# Patient Record
Sex: Female | Born: 1965 | Race: White | Hispanic: No | Smoking: Never smoker
Health system: Southern US, Community
[De-identification: ages and names within clinical notes are randomized; demographics above are authoritative.]

## PROBLEM LIST (undated history)

## (undated) DIAGNOSIS — M1990 Unspecified osteoarthritis, unspecified site: Secondary | ICD-10-CM

## (undated) DIAGNOSIS — G43909 Migraine, unspecified, not intractable, without status migrainosus: Secondary | ICD-10-CM

## (undated) DIAGNOSIS — I10 Essential (primary) hypertension: Secondary | ICD-10-CM

## (undated) DIAGNOSIS — Z0282 Encounter for adoption services: Secondary | ICD-10-CM

## (undated) DIAGNOSIS — K219 Gastro-esophageal reflux disease without esophagitis: Secondary | ICD-10-CM

## (undated) DIAGNOSIS — Z789 Other specified health status: Secondary | ICD-10-CM

## (undated) DIAGNOSIS — T7840XA Allergy, unspecified, initial encounter: Secondary | ICD-10-CM

## (undated) HISTORY — DX: Gastro-esophageal reflux disease without esophagitis: K21.9

## (undated) HISTORY — DX: Migraine, unspecified, not intractable, without status migrainosus: G43.909

## (undated) HISTORY — PX: CERVICAL CONE BIOPSY: SUR198

## (undated) HISTORY — DX: Allergy, unspecified, initial encounter: T78.40XA

## (undated) HISTORY — DX: Unspecified osteoarthritis, unspecified site: M19.90

## (undated) HISTORY — PX: SHOULDER SURGERY: SHX246

## (undated) HISTORY — DX: Encounter for adoption services: Z02.82

## (undated) HISTORY — DX: Other specified health status: Z78.9

---

## 1988-06-22 DIAGNOSIS — Z9889 Other specified postprocedural states: Secondary | ICD-10-CM

## 1988-06-22 HISTORY — DX: Other specified postprocedural states: Z98.890

## 2011-01-22 ENCOUNTER — Other Ambulatory Visit: Payer: Self-pay | Admitting: Orthopaedic Surgery

## 2011-01-22 DIAGNOSIS — M545 Low back pain, unspecified: Secondary | ICD-10-CM

## 2011-01-26 ENCOUNTER — Ambulatory Visit
Admission: RE | Admit: 2011-01-26 | Discharge: 2011-01-26 | Disposition: A | Discharge status: Home / Self Care | Payer: 59 | Source: Ambulatory Visit | Attending: Orthopaedic Surgery | Admitting: Orthopaedic Surgery

## 2011-01-26 DIAGNOSIS — M545 Low back pain, unspecified: Secondary | ICD-10-CM

## 2014-10-03 ENCOUNTER — Emergency Department (HOSPITAL_COMMUNITY)
Admission: EM | Admit: 2014-10-03 | Discharge: 2014-10-03 | Disposition: A | Discharge status: Home / Self Care | Payer: 59 | Attending: Emergency Medicine | Admitting: Emergency Medicine

## 2014-10-03 ENCOUNTER — Encounter (HOSPITAL_COMMUNITY): Payer: Self-pay | Admitting: *Deleted

## 2014-10-03 DIAGNOSIS — M5441 Lumbago with sciatica, right side: Secondary | ICD-10-CM

## 2014-10-03 DIAGNOSIS — I10 Essential (primary) hypertension: Secondary | ICD-10-CM | POA: Insufficient documentation

## 2014-10-03 DIAGNOSIS — Z79899 Other long term (current) drug therapy: Secondary | ICD-10-CM | POA: Insufficient documentation

## 2014-10-03 DIAGNOSIS — M5440 Lumbago with sciatica, unspecified side: Secondary | ICD-10-CM | POA: Insufficient documentation

## 2014-10-03 DIAGNOSIS — M545 Low back pain: Secondary | ICD-10-CM | POA: Diagnosis present

## 2014-10-03 DIAGNOSIS — M5442 Lumbago with sciatica, left side: Secondary | ICD-10-CM

## 2014-10-03 HISTORY — DX: Essential (primary) hypertension: I10

## 2014-10-03 MED ORDER — OXYCODONE-ACETAMINOPHEN 5-325 MG PO TABS: 1.0000 | ORAL_TABLET | ORAL | Status: DC | PRN

## 2014-10-03 MED ORDER — KETOROLAC TROMETHAMINE 30 MG/ML IJ SOLN
30.0000 mg | Freq: Once | INTRAMUSCULAR | Status: AC
  Administered 2014-10-03: 30 mg via INTRAMUSCULAR
  Filled 2014-10-03: qty 1

## 2014-10-03 MED ORDER — DIAZEPAM 5 MG PO TABS
5.0000 mg | ORAL_TABLET | Freq: Once | ORAL | Status: AC
  Administered 2014-10-03: 5 mg via ORAL
  Filled 2014-10-03: qty 1

## 2014-10-03 MED ORDER — HYDROMORPHONE HCL 1 MG/ML IJ SOLN
1.0000 mg | Freq: Once | INTRAMUSCULAR | Status: AC
Start: 2014-10-03 — End: 2014-10-03
  Administered 2014-10-03: 1 mg via INTRAMUSCULAR
  Filled 2014-10-03: qty 1

## 2014-10-03 MED ORDER — DEXAMETHASONE 4 MG PO TABS
12.0000 mg | ORAL_TABLET | Freq: Once | ORAL | Status: AC
  Administered 2014-10-03: 12 mg via ORAL
  Filled 2014-10-03: qty 3

## 2014-10-03 MED ORDER — METHOCARBAMOL 500 MG PO TABS: 500.0000 mg | ORAL_TABLET | Freq: Two times a day (BID) | ORAL | Status: DC | PRN

## 2014-10-03 NOTE — Discharge Instructions (Signed)
Back Pain, Adult °Back pain is very common. The pain often gets better over time. The cause of back pain is usually not dangerous. Most people can learn to manage their back pain on their own.  °HOME CARE  °· Stay active. Start with short walks on flat ground if you can. Try to walk farther each day. °· Do not sit, drive, or stand in one place for more than 30 minutes. Do not stay in bed. °· Do not avoid exercise or work. Activity can help your back heal faster. °· Be careful when you bend or lift an object. Bend at your knees, keep the object close to you, and do not twist. °· Sleep on a firm mattress. Lie on your side, and bend your knees. If you lie on your back, put a pillow under your knees. °· Only take medicines as told by your doctor. °· Put ice on the injured area. °¨ Put ice in a plastic bag. °¨ Place a towel between your skin and the bag. °¨ Leave the ice on for 15-20 minutes, 03-04 times a day for the first 2 to 3 days. After that, you can switch between ice and heat packs. °· Ask your doctor about back exercises or massage. °· Avoid feeling anxious or stressed. Find good ways to deal with stress, such as exercise. °GET HELP RIGHT AWAY IF:  °· Your pain does not go away with rest or medicine. °· Your pain does not go away in 1 week. °· You have new problems. °· You do not feel well. °· The pain spreads into your legs. °· You cannot control when you poop (bowel movement) or pee (urinate). °· Your arms or legs feel weak or lose feeling (numbness). °· You feel sick to your stomach (nauseous) or throw up (vomit). °· You have belly (abdominal) pain. °· You feel like you may pass out (faint). °MAKE SURE YOU:  °· Understand these instructions. °· Will watch your condition. °· Will get help right away if you are not doing well or get worse. °Document Released: 11/25/2007 Document Revised: 08/31/2011 Document Reviewed: 10/10/2013 °Exit[employer]® Patient Information ©2015 [employer]. This information is not intended  to replace advice given to you by your health care provider. Make sure you discuss any questions you have with your health care provider. ° °

## 2014-10-03 NOTE — ED Notes (Signed)
Pain low back, onset yesterday, Radiation down both thighs.

## 2014-10-03 NOTE — ED Notes (Signed)
MD at bedside. 

## 2014-10-03 NOTE — ED Provider Notes (Signed)
CSN: [id number]     Arrival date & time 10/03/14  1148 History  This chart was scribed for Raeford Razor, MD by Elveria Rising, ED scribe.  This patient was seen in room APA04/[facility]04 and the patient's care was started at 12:18 PM.    Chief Complaint  Patient presents with  . Back Pain   The history is provided by the patient. No language interpreter was used.   HPI Comments: Amanda Mack is a 49 y.o. female who presents to the Emergency Department complaining of acute lower back pain with radiation into her thighs, onset yesterday. Patient reports onset after getting up from a seated position and walking. Patient reports spasming pain in her lower, kneading pain in her buttocks, tingling pain in posterior thighs, and occasional  numbing sensation in her feet. Patient reports exacerbated pain with sitting especially and ambulation. Patient reports attempted treatment with Aleve and reports mild relief, stating it allowed her to get dressed.  Patient reports remote history of back pain, stating she hasn't had a flare up in 8 years. Patient is an Health and safety inspector and states that this activity effectively  keeps back pain at bay. Patient reports past recommendation for back surgery by Dr. Lorenso Courier, but she declined given her age.   Past Medical History  Diagnosis Date  . Hypertension    Past Surgical History  Procedure Laterality Date  . Shoulder surgery     History reviewed. No pertinent family history. History  Substance Use Topics  . Smoking status: Never Smoker   . Smokeless tobacco: Not on file  . Alcohol Use: No   OB History    No data available     Review of Systems  Constitutional: Negative for fever and chills.  HENT: Negative for congestion and sore throat.   Eyes: Negative for pain.  Respiratory: Negative for choking, shortness of breath and wheezing.   Cardiovascular: Negative for chest pain.  Gastrointestinal: Negative for vomiting, abdominal pain and diarrhea.   Genitourinary: Negative for dysuria and hematuria.  Musculoskeletal: Positive for myalgias and back pain. Negative for gait problem and neck pain.  Skin: Negative for rash.  Allergic/Immunologic: Negative for immunocompromised state.  Neurological: Negative for weakness, numbness and headaches.  Hematological: Negative for adenopathy.  Psychiatric/Behavioral: Negative for behavioral problems.      Allergies  Review of patient's allergies indicates no known allergies.  Home Medications   Prior to Admission medications   Medication Sig Start Date End Date Taking? Authorizing Provider  lisinopril-hydrochlorothiazide (PRINZIDE,ZESTORETIC) 20-12.5 MG per tablet Take 2 tablets by mouth daily. 07/20/14  Yes Historical Provider, MD  naproxen sodium (ANAPROX) 220 MG tablet Take 440 mg by mouth daily as needed (pain).   Yes Historical Provider, MD  omeprazole (PRILOSEC) 20 MG capsule Take 1 capsule by mouth at bedtime.  08/13/14  Yes Historical Provider, MD  SPRINTEC 28 0.25-35 MG-MCG tablet Take 1 tablet by mouth at bedtime.  09/10/14  Yes Historical Provider, MD   Triage Vitals: BP 123/84 mmHg  Pulse 122  Temp(Src) 97.9 F (36.6 C) (Oral)  Resp 18  Ht  (1.651 m)  Wt 210 lb (95.255 kg)  BMI 34.95 kg/m2  SpO2 100%  LMP 06/17/2014 (Exact Date) Physical Exam  Constitutional: She is oriented to person, place, and time. She appears well-developed and well-nourished. No distress.  HENT:  Head: Normocephalic and atraumatic.  Eyes: EOM are normal.  Neck: Neck supple. No tracheal deviation present.  Cardiovascular: Normal rate.   Pulmonary/Chest: Effort  normal. No respiratory distress.  Musculoskeletal: Normal range of motion.  Diffuse lumbar tenderness midline and paraspinally. Normal strength in lower extremities. Sensation intact to light touch.   Neurological: She is alert and oriented to person, place, and time.  Skin: Skin is warm and dry.  Psychiatric: She has a normal mood and  affect. Her behavior is normal.  Nursing note and vitals reviewed.   ED Course  Procedures (including critical care time)  COORDINATION OF CARE: 12:59 PM- Patient agrees to injections. Discussed treatment plan with patient at bedside and patient agreed to plan.   Labs Review Labs Reviewed - No data to display  Imaging Review No results found.   EKG Interpretation None      MDM   Final diagnoses:  Bilateral low back pain with sciatica, sciatica laterality unspecified    48yF with lower back pain.  Atraumatic. Non focal neuro exam. No blood thinning medications. No bladder/bowel incontinence or retention. Denies hx of IV drug use. Doubt cauda equina, spinal epidural abscess, spinal epidural hematoma, fracture, vertebral osteomyelitis/discitis or other potential emergent etiology. No indication for emergent imaging.  Plan symptomatic tx. Return precautions discussed. Outpt FU otherwise.  I personally preformed the services scribed in my presence. The recorded information has been reviewed is accurate. Raeford RazorStephen Chayil Gantt, MD.    Raeford RazorStephen Monish Haliburton, MD 10/08/14 1600

## 2016-01-13 ENCOUNTER — Ambulatory Visit (INDEPENDENT_AMBULATORY_CARE_PROVIDER_SITE_OTHER): Payer: 59 | Admitting: Otolaryngology

## 2016-01-13 DIAGNOSIS — R490 Dysphonia: Secondary | ICD-10-CM

## 2016-01-13 DIAGNOSIS — K219 Gastro-esophageal reflux disease without esophagitis: Secondary | ICD-10-CM

## 2016-04-06 ENCOUNTER — Encounter: Payer: Self-pay | Admitting: Family Medicine

## 2016-04-17 ENCOUNTER — Ambulatory Visit (INDEPENDENT_AMBULATORY_CARE_PROVIDER_SITE_OTHER): Payer: 59 | Admitting: Family Medicine

## 2016-04-17 ENCOUNTER — Encounter: Payer: Self-pay | Admitting: Family Medicine

## 2016-04-17 VITALS — BP 120/85 | HR 96 | Resp 12 | Ht 65.0 in | Wt 230.0 lb

## 2016-04-17 DIAGNOSIS — R002 Palpitations: Secondary | ICD-10-CM | POA: Diagnosis not present

## 2016-04-17 DIAGNOSIS — I10 Essential (primary) hypertension: Secondary | ICD-10-CM | POA: Insufficient documentation

## 2016-04-17 DIAGNOSIS — Z6841 Body Mass Index (BMI) 40.0 and over, adult: Secondary | ICD-10-CM

## 2016-04-17 DIAGNOSIS — Z6838 Body mass index (BMI) 38.0-38.9, adult: Secondary | ICD-10-CM

## 2016-04-17 DIAGNOSIS — E669 Obesity, unspecified: Secondary | ICD-10-CM | POA: Insufficient documentation

## 2016-04-17 LAB — BASIC METABOLIC PANEL
BUN: 12 mg/dL (ref 6–23)
CALCIUM: 9.6 mg/dL (ref 8.4–10.5)
CO2: 25 meq/L (ref 19–32)
Chloride: 107 mEq/L (ref 96–112)
Creatinine, Ser: 0.81 mg/dL (ref 0.40–1.20)
GFR: 80.15 mL/min (ref >60.00)
Glucose, Bld: 118 mg/dL — ABNORMAL HIGH (ref 70–99)
Potassium: 4.1 mEq/L (ref 3.5–5.1)
SODIUM: 139 meq/L (ref 135–145)

## 2016-04-17 MED ORDER — METOPROLOL SUCCINATE ER 25 MG PO TB24: 25.0000 mg | ORAL_TABLET | Freq: Every day | ORAL | 2 refills | Status: DC

## 2016-04-17 NOTE — Patient Instructions (Addendum)
A few things to remember from today's visit:   Palpitations - Plan: EKG 12-Lead  Essential hypertension - Plan: EKG 12-Lead  BMI 38.0-38.9,adult  Blood pressure goal for most people is less than 140/90.  Elevated blood pressure increases the risk of strokes, heart and kidney disease, and eye problems. Regular physical activity and a healthy diet (DASH diet) usually help. Low salt diet. Take medications as instructed. Caution with some over the counter medications as cold medications, dietary products (for weight loss), and Ibuprofen or Aleve (frequent use);all these medications could cause elevation of blood pressure.    Please be sure medication list is accurate. If a new problem present, please set up appointment sooner than planned today.

## 2016-04-17 NOTE — Progress Notes (Signed)
HPI:   Amanda Mack is a 50 y.o. female, who is here today to establish care with me.  Former PCP: Deanna Artis  Last preventive routine visit: 2017. She follows with gyn for her routine female preventive care.   Concerns today: Palpitations, "heart racing"  Over 20 years of intermittent palpitations. According to pt, at age 35 she had cardiac work-up because palpitations, negative otherwise (Holter monitor, echo). She did not have episodes for years and a few years ago started again. They had been infrequent but for the past year or so they seems to be almost daily and a few times during the day. She has not identified exacerbating or relieving factors. Palpitation usually lasts a few minutes.  She usually has symptom at rest or upon mild intensity exercise like walking from one room to another one. She denies any palpitation with moderate or intense physical activity. No associated chest pain, diaphoresis, dyspnea, or dizziness.  She denies any anxiety or Hx of depression.  Hypertension:   1-2 years Hx of HTN. Currently on Lisinopril-HCTZ 20-12.5 mg 1 tab daily, she was supposed to be on 2 tabs daily but decreased to one because she was having dizziness. Improved dizziness after decreasing dose of antihypertensive med. Reporting labs done about 6 months ago. BP readings at home < 140/90, HR mid.high 90's.  She has not noted unusual headache, visual changes, exertional chest pain, dyspnea,  focal weakness, or edema.   She exercises regularly, swimming instructor. She has not been consistent with a healthful diet but in general she does not think her diet is bad.   Hx of GERD, well controlled with Omeprazole 20 mg daily.    Review of Systems  Constitutional: Negative for activity change, appetite change, fatigue, fever and unexpected weight change.  HENT: Negative for mouth sores, nosebleeds and trouble swallowing.   Eyes: Negative for pain, redness and  visual disturbance.  Respiratory: Negative for cough, shortness of breath and wheezing.   Cardiovascular: Positive for palpitations. Negative for chest pain and leg swelling.  Gastrointestinal: Negative for abdominal pain, nausea and vomiting.       Negative for changes in bowel habits.  Genitourinary: Negative for decreased urine volume, difficulty urinating and hematuria.  Skin: Negative for color change and rash.  Neurological: Negative for syncope, weakness, numbness and headaches.  Psychiatric/Behavioral: Negative for confusion and sleep disturbance. The patient is not nervous/anxious.       Current Outpatient Prescriptions on File Prior to Visit  Medication Sig Dispense Refill  . methocarbamol (ROBAXIN) 500 MG tablet Take 1 tablet (500 mg total) by mouth 2 (two) times daily as needed for muscle spasms. 20 tablet 0  . naproxen sodium (ANAPROX) 220 MG tablet Take 440 mg by mouth daily as needed (pain).    Marland Kitchen omeprazole (PRILOSEC) 20 MG capsule Take 1 capsule by mouth at bedtime.   3  . SPRINTEC 28 0.25-35 MG-MCG tablet Take 1 tablet by mouth at bedtime.   4   No current facility-administered medications on file prior to visit.      Past Medical History:  Diagnosis Date  . Hypertension    No Known Allergies  Family History  Problem Relation Age of Onset  . Adopted: Yes    Social History   Social History  . Marital status: Married    Spouse name: N/A  . Number of children: N/A  . Years of education: N/A   Social History Main Topics  .  Smoking status: Never Smoker  . Smokeless tobacco: Never Used  . Alcohol use No  . Drug use: No  . Sexual activity: Yes    Birth control/ protection: Pill   Other Topics Concern  . None   Social History Narrative  . None    Vitals:   04/17/16 1304  BP: 120/85  Pulse: 96  Resp: 12   O2 sat at RA 98%   Body mass index is 38.27 kg/m.    Physical Exam  Nursing note and vitals reviewed. Constitutional: She is oriented  to person, place, and time. She appears well-developed. No distress.  HENT:  Head: Atraumatic.  Mouth/Throat: Oropharynx is clear and moist and mucous membranes are normal.  Eyes: Conjunctivae and EOM are normal. Pupils are equal, round, and reactive to light.  Neck: No JVD present. No tracheal deviation present. No thyroid mass and no thyromegaly present.  Cardiovascular: Normal rate and regular rhythm.   No murmur heard. Pulses:      Dorsalis pedis pulses are 2+ on the right side, and 2+ on the left side.  Respiratory: Effort normal and breath sounds normal. No respiratory distress.  GI: Soft. She exhibits no mass. There is no hepatomegaly. There is no tenderness.  Musculoskeletal: She exhibits no edema or tenderness.  Lymphadenopathy:    She has no cervical adenopathy.  Neurological: She is alert and oriented to person, place, and time. She has normal strength. No cranial nerve deficit. Coordination and gait normal.  Skin: Skin is warm. No erythema.  Psychiatric: She has a normal mood and affect.  Well groomed, good eye contact.      ASSESSMENT AND PLAN:     [name] was seen today for establish care.  Diagnoses and all orders for this visit:  Palpitations  Possible etiologies discussed, Hx does not suggest a concerning process. She is reporting recent lab work done and negative. EKG today NSR, normal axis and intervals. She agrees with trying BB and prefers to hold on cardiology referral. Instructed about warning signs.   -     EKG 12-Lead -     Basic Metabolic Panel -     metoprolol succinate (TOPROL-XL) 25 MG 24 hr tablet; Take 1 tablet (25 mg total) by mouth daily.  Essential hypertension  Adequately controlled. Because palpitations and high normal HR, I think she will benefit from BB, Metoprolol Succinate 25 mg daily.Some side effects discussed.Monitor BP at home. DASH-low salt diet recommended. Eye exam recommended annually. F/U in 2 months, before if  needed.  -     EKG 12-Lead -     Basic Metabolic Panel -     metoprolol succinate (TOPROL-XL) 25 MG 24 hr tablet; Take 1 tablet (25 mg total) by mouth daily.  BMI 38.0-38.9,adult  We discussed benefits of wt loss as well as adverse effects of obesity. Consistency with healthy diet and physical activity recommended.           Kayman Snuffer G. [place]Jordan, MD  [facility]. [place] office.

## 2016-04-17 NOTE — Progress Notes (Signed)
Pre visit review using our clinic review tool, if applicable. No additional management support is needed unless otherwise documented below in the visit note. 

## 2016-04-19 ENCOUNTER — Encounter: Payer: Self-pay | Admitting: Family Medicine

## 2016-05-21 DIAGNOSIS — G43829 Menstrual migraine, not intractable, without status migrainosus: Secondary | ICD-10-CM | POA: Insufficient documentation

## 2016-06-25 ENCOUNTER — Encounter: Payer: Self-pay | Admitting: Family Medicine

## 2016-06-25 ENCOUNTER — Ambulatory Visit (INDEPENDENT_AMBULATORY_CARE_PROVIDER_SITE_OTHER): Payer: 59 | Admitting: Family Medicine

## 2016-06-25 VITALS — BP 134/80 | HR 97 | Resp 12 | Ht 65.0 in | Wt 246.0 lb

## 2016-06-25 DIAGNOSIS — R002 Palpitations: Secondary | ICD-10-CM

## 2016-06-25 DIAGNOSIS — Z6841 Body Mass Index (BMI) 40.0 and over, adult: Secondary | ICD-10-CM

## 2016-06-25 DIAGNOSIS — R739 Hyperglycemia, unspecified: Secondary | ICD-10-CM

## 2016-06-25 DIAGNOSIS — I10 Essential (primary) hypertension: Secondary | ICD-10-CM | POA: Diagnosis not present

## 2016-06-25 LAB — POCT GLYCOSYLATED HEMOGLOBIN (HGB A1C): HEMOGLOBIN A1C: 5.3

## 2016-06-25 MED ORDER — METOPROLOL SUCCINATE ER 25 MG PO TB24: 25.0000 mg | ORAL_TABLET | Freq: Every day | ORAL | 2 refills | Status: DC

## 2016-06-25 NOTE — Progress Notes (Signed)
Pre visit review using our clinic review tool, if applicable. No additional management support is needed unless otherwise documented below in the visit note. 

## 2016-06-25 NOTE — Progress Notes (Signed)
HPI:   [name] is a 51 y.o. female, who is here today to follow on last OV, 04/17/16.  Hypertension: Started on Metoprolol Succinate 25 mg daily, she was also c/o chronic intermittent palpitations. She has tolerated medication well and noted that palpitations resolved after 2 days of taking medications. Denies severe/frequent headache, visual changes, chest pain, dyspnea, claudication, focal weakness, or edema.   BP readings at home: 116-120/70-80's.  She is concerned about her wt. She thinks her diet is healthy, she usually does not consume high amount of carbs or sweets and does not understands why she cannot lose wt.  She is planning on starting on regular exercise with a trainer. She is a Engineer, agricultural.  Last OV labs were done, not fasting. Noted glucose elevated at 118, 04/17/16. She denies Hx of diabetes.  She is reporting extensive lab work done in the past due to palpitations and obesity, including TSH.    Review of Systems  Constitutional: Positive for fatigue (no more than usual). Negative for activity change, appetite change and fever.  HENT: Negative for mouth sores, nosebleeds and trouble swallowing.   Eyes: Negative for pain, redness and visual disturbance.  Respiratory: Negative for cough, shortness of breath and wheezing.   Cardiovascular: Negative for chest pain, palpitations and leg swelling.  Gastrointestinal: Negative for abdominal pain, nausea and vomiting.       Negative for changes in bowel habits.  Endocrine: Negative for polydipsia, polyphagia and polyuria.  Genitourinary: Negative for decreased urine volume, difficulty urinating and hematuria.  Musculoskeletal: Negative for gait problem and myalgias.  Neurological: Negative for syncope, weakness and headaches.  Psychiatric/Behavioral: Negative for confusion. The patient is not nervous/anxious.       Current Outpatient Prescriptions on File Prior to Visit  Medication Sig  Dispense Refill  . omeprazole (PRILOSEC) 20 MG capsule Take 1 capsule by mouth at bedtime.   3  . SPRINTEC 28 0.25-35 MG-MCG tablet Take 1 tablet by mouth at bedtime.   4   No current facility-administered medications on file prior to visit.      Past Medical History:  Diagnosis Date  . Hypertension    No Known Allergies  Social History   Social History  . Marital status: Married    Spouse name: N/A  . Number of children: N/A  . Years of education: N/A   Social History Main Topics  . Smoking status: Never Smoker  . Smokeless tobacco: Never Used  . Alcohol use No  . Drug use: No  . Sexual activity: Yes    Birth control/ protection: Pill   Other Topics Concern  . None   Social History Narrative  . None    Vitals:   06/25/16 0932  BP: 134/80  Pulse: 97  Resp: 12   Body mass index is 40.94 kg/m.   Wt Readings from Last 3 Encounters:  06/25/16 246 lb (111.6 kg)  04/17/16 230 lb (104.3 kg)  10/03/14 210 lb (95.3 kg)      Physical Exam  Nursing note and vitals reviewed. Constitutional: She is oriented to person, place, and time. She appears well-developed. No distress.  HENT:  Head: Atraumatic.  Mouth/Throat: Oropharynx is clear and moist and mucous membranes are normal.  Eyes: Conjunctivae and EOM are normal. Pupils are equal, round, and reactive to light.  Cardiovascular: Normal rate and regular rhythm.   No murmur heard. Pulses:      Dorsalis pedis pulses are 2+ on the  right side, and 2+ on the left side.  Respiratory: Effort normal and breath sounds normal. No respiratory distress.  GI: Soft. She exhibits no mass. There is no hepatomegaly. There is no tenderness.  Musculoskeletal: She exhibits no edema.  Neurological: She is alert and oriented to person, place, and time. She has normal strength. Coordination and gait normal.  Skin: Skin is warm. No erythema.  Psychiatric: She has a normal mood and affect.  Well groomed, good eye contact.       ASSESSMENT AND PLAN:     Amanda Mack was seen today for follow-up.  Diagnoses and all orders for this visit:    Essential hypertension  Adequately controlled. No changes in current management. DASH-low salt  diet recommended. Eye exam recommended annually. F/U in 6 months, before if needed.  -     metoprolol succinate (TOPROL-XL) 25 MG 24 hr tablet; Take 1 tablet (25 mg total) by mouth daily.  Hyperglycemia  A1C in normal range. Primary prevention though regular physical activity and healthy diet discussed.  -     POC HgB A1c  Palpitations  Resolved. No changes in current management.  -     metoprolol succinate (TOPROL-XL) 25 MG 24 hr tablet; Take 1 tablet (25 mg total) by mouth daily.  BMI 40.0-44.9, adult (HCC)  Gained about 16 pounds since her last OV. We discussed benefits of wt loss as well as adverse effects of obesity. Consistency with healthy diet and physical activity recommended. Food diary may help to track calorie intake.     -[name] was advised to return sooner than planned today if new concerns arise.       Mikel Hardgrove G. [place], MD  [facility]. [place] office.

## 2016-06-25 NOTE — Patient Instructions (Signed)
A few things to remember from today's visit:   Essential hypertension - Plan: metoprolol succinate (TOPROL-XL) 25 MG 24 hr tablet  Hyperglycemia  Palpitations - Plan: metoprolol succinate (TOPROL-XL) 25 MG 24 hr tablet  BMI 40.0-44.9, adult (HCC)   Please be sure medication list is accurate. If a new problem present, please set up appointment sooner than planned today.

## 2016-08-31 ENCOUNTER — Telehealth: Payer: Self-pay | Admitting: *Deleted

## 2016-08-31 DIAGNOSIS — I10 Essential (primary) hypertension: Secondary | ICD-10-CM

## 2016-08-31 DIAGNOSIS — R002 Palpitations: Secondary | ICD-10-CM

## 2016-08-31 MED ORDER — METOPROLOL SUCCINATE ER 25 MG PO TB24: 50.0000 mg | ORAL_TABLET | Freq: Every day | ORAL | 2 refills | Status: DC

## 2016-08-31 NOTE — Telephone Encounter (Signed)
Patient called in to report blood pressure has continued to be elevated for the past 3-5 days; today bp was 180/105; patient denies chest pain, headache, dizziness, discussed with Dr. [place]Jordan who advised patient to increase Metoprolol 25mg  to two tablets daily and for patient to follow up in 4 weeks. Educated patient on medication dose change, advised of s/s to report to MD or EMS also instructed to continue to monitor blood pressures daily. Scheduled follow up with patient on 09/29/16 at 230pm.

## 2016-09-29 ENCOUNTER — Encounter: Payer: Self-pay | Admitting: Family Medicine

## 2016-09-29 ENCOUNTER — Ambulatory Visit (INDEPENDENT_AMBULATORY_CARE_PROVIDER_SITE_OTHER): Payer: 59 | Admitting: Family Medicine

## 2016-09-29 VITALS — BP 130/85 | HR 99 | Resp 12 | Ht 65.0 in | Wt 249.4 lb

## 2016-09-29 DIAGNOSIS — I10 Essential (primary) hypertension: Secondary | ICD-10-CM

## 2016-09-29 DIAGNOSIS — Z6841 Body Mass Index (BMI) 40.0 and over, adult: Secondary | ICD-10-CM | POA: Diagnosis not present

## 2016-09-29 DIAGNOSIS — R000 Tachycardia, unspecified: Secondary | ICD-10-CM | POA: Insufficient documentation

## 2016-09-29 MED ORDER — METOPROLOL SUCCINATE ER 50 MG PO TB24: 50.0000 mg | ORAL_TABLET | Freq: Every day | ORAL | 2 refills | Status: DC

## 2016-09-29 NOTE — Patient Instructions (Signed)
A few things to remember from today's visit:   Essential hypertension - Plan: metoprolol succinate (TOPROL-XL) 50 MG 24 hr tablet  Blood pressure goal for most people is less than 140/90. Some populations (older than 60) the goal is less than 150/90.  Most recent cardiologists' recommendations recommend blood pressure at or less than 130/80.   Elevated blood pressure increases the risk of strokes, heart and kidney disease, and eye problems. Regular physical activity and a healthy diet (DASH diet) usually help. Low salt diet. Take medications as instructed.  Caution with some over the counter medications as cold medications, dietary products (for weight loss), and Ibuprofen or Aleve (frequent use);all these medications could cause elevation of blood pressure.   Please be sure medication list is accurate. If a new problem present, please set up appointment sooner than planned today.

## 2016-09-29 NOTE — Progress Notes (Signed)
[name] is a 51 y.o.female, who is here today to follow on HTN.  Currently she is on Metoprolol Succinate,which was recently increased from 50 mg to 100 mg daily.She called on 08/31/16 reporting elevated BP's, 180/105. BP readings now 120/70-80. She follows a low salt diet ad exercises regularly. . She is taking medications as instructed, no side effects reported.  She has not noted unusual headache, visual changes, exertional chest pain, dyspnea,  focal weakness, or edema.  + Increased stress at work.  Lab Results  Component Value Date   CREATININE 0.81 04/17/2016   BUN 12 04/17/2016   NA 139 04/17/2016   K 4.1 04/17/2016   CL 107 04/17/2016   CO2 25 04/17/2016    Palpitations are greatly improved,she has not had an episode sine her last OV.   Review of Systems  Constitutional: Negative for appetite change, fatigue and unexpected weight change.  HENT: Negative for mouth sores, nosebleeds and trouble swallowing.   Eyes: Negative for redness and visual disturbance.  Respiratory: Negative for cough, shortness of breath and wheezing.   Cardiovascular: Negative for chest pain, palpitations and leg swelling.  Gastrointestinal: Negative for abdominal pain, nausea and vomiting.       Negative for changes in bowel habits.  Genitourinary: Negative for decreased urine volume and hematuria.  Musculoskeletal: Negative for gait problem and myalgias.  Neurological: Negative for syncope, weakness and headaches.  Psychiatric/Behavioral: Negative for confusion and sleep disturbance.     Current Outpatient Prescriptions on File Prior to Visit  Medication Sig Dispense Refill  . omeprazole (PRILOSEC) 20 MG capsule Take 1 capsule by mouth at bedtime.   3  . SPRINTEC 28 0.25-35 MG-MCG tablet Take 1 tablet by mouth at bedtime.   4   No current facility-administered medications on file prior to visit.      Past Medical History:  Diagnosis Date  . Hypertension     No  Known Allergies  Social History   Social History  . Marital status: Married    Spouse name: N/A  . Number of children: N/A  . Years of education: N/A   Social History Main Topics  . Smoking status: Never Smoker  . Smokeless tobacco: Never Used  . Alcohol use No  . Drug use: No  . Sexual activity: Yes    Birth control/ protection: Pill   Other Topics Concern  . None   Social History Narrative  . None    Vitals:   09/29/16 1429 09/29/16 1503  BP: (!) 140/100 130/85  Pulse: 99   Resp: 12    Body mass index is 41.5 kg/m.  Wt Readings from Last 3 Encounters:  09/29/16 249 lb 6 oz (113.1 kg)  06/25/16 246 lb (111.6 kg)  04/17/16 230 lb (104.3 kg)    Physical Exam  Nursing note and vitals reviewed. Constitutional: She is oriented to person, place, and time. She appears well-developed. No distress.  HENT:  Head: Atraumatic.  Mouth/Throat: Oropharynx is clear and moist and mucous membranes are normal.  Eyes: Conjunctivae and EOM are normal. Pupils are equal, round, and reactive to light.  Cardiovascular: Normal rate and regular rhythm.   No murmur heard. Pulses:      Dorsalis pedis pulses are 2+ on the right side, and 2+ on the left side.  Respiratory: Effort normal and breath sounds normal. No respiratory distress.  GI: Soft. She exhibits no mass. There is no hepatomegaly. There is no tenderness.  Musculoskeletal: She  exhibits no edema.  Lymphadenopathy:    She has no cervical adenopathy.  Neurological: She is alert and oriented to person, place, and time. She has normal strength. No cranial nerve deficit. Coordination and gait normal.  Skin: Skin is warm. No erythema.  Psychiatric: She has a normal mood and affect.  Well groomed, good eye contact.    ASSESSMENT AND PLAN:   Amanda Mack was seen today for follow-up.  Diagnoses and all orders for this visit:  Essential hypertension  Better controlled. No changes in current management. DASH-low diet  recommended. Continue monitoring BP. Eye exam recommended annually. F/U in 6 months, before if needed.  -     metoprolol succinate (TOPROL-XL) 50 MG 24 hr tablet; Take 1 tablet (50 mg total) by mouth daily. Take with or immediately following a meal.  Sinus tachycardia  She has not had symptoms recently. HR still on upper normal range. She has reported normal TSH in the past, 2016 TSH 2.5. Will check with next labs.   BMI 40.0-44.9, adult [facility])  She has gained wt steadily since 03/2016. I recommend keeping a diary of her  food intake. Continue regular exercise. We discussed benefits of wt loss as well as adverse effects of obesity.      -[name] advised to return sooner than planned today if new concerns arise.     Betty G. [place], MD  [facility]. [place] office.

## 2016-09-29 NOTE — Progress Notes (Signed)
Pre visit review using our clinic review tool, if applicable. No additional management support is needed unless otherwise documented below in the visit note. 

## 2016-12-29 ENCOUNTER — Ambulatory Visit: Payer: 59 | Admitting: Family Medicine

## 2016-12-29 DIAGNOSIS — Z0289 Encounter for other administrative examinations: Secondary | ICD-10-CM

## 2017-01-28 ENCOUNTER — Other Ambulatory Visit: Payer: Self-pay | Admitting: Occupational Medicine

## 2017-01-28 ENCOUNTER — Ambulatory Visit: Payer: Self-pay

## 2017-01-28 DIAGNOSIS — M545 Low back pain: Secondary | ICD-10-CM

## 2017-03-16 DIAGNOSIS — K219 Gastro-esophageal reflux disease without esophagitis: Secondary | ICD-10-CM | POA: Diagnosis not present

## 2017-03-16 DIAGNOSIS — R490 Dysphonia: Secondary | ICD-10-CM | POA: Diagnosis not present

## 2017-05-28 DIAGNOSIS — Z1231 Encounter for screening mammogram for malignant neoplasm of breast: Secondary | ICD-10-CM | POA: Diagnosis not present

## 2017-06-07 ENCOUNTER — Other Ambulatory Visit: Payer: Self-pay | Admitting: *Deleted

## 2017-06-07 DIAGNOSIS — I10 Essential (primary) hypertension: Secondary | ICD-10-CM

## 2017-06-07 MED ORDER — METOPROLOL SUCCINATE ER 50 MG PO TB24: 50.0000 mg | ORAL_TABLET | Freq: Every day | ORAL | 2 refills | Status: DC

## 2017-06-21 DIAGNOSIS — Z01419 Encounter for gynecological examination (general) (routine) without abnormal findings: Secondary | ICD-10-CM | POA: Diagnosis not present

## 2017-06-21 DIAGNOSIS — Z124 Encounter for screening for malignant neoplasm of cervix: Secondary | ICD-10-CM | POA: Diagnosis not present

## 2017-06-22 HISTORY — PX: KNEE ARTHROSCOPY: SUR90

## 2017-09-29 DIAGNOSIS — H1131 Conjunctival hemorrhage, right eye: Secondary | ICD-10-CM | POA: Diagnosis not present

## 2018-01-24 DIAGNOSIS — M25562 Pain in left knee: Secondary | ICD-10-CM | POA: Diagnosis not present

## 2018-02-27 NOTE — Progress Notes (Signed)
HPI:   [name] is a 52 y.o. female, who is here today for follow up.   She was last seen in 09/2016.  HTN:  She is on Metoprolol Succinate  XL 50 mg daily.  Lab Results  Component Value Date   CREATININE 0.81 04/17/2016   BUN 12 04/17/2016   NA 139 04/17/2016   K 4.1 04/17/2016   CL 107 04/17/2016   CO2 25 04/17/2016   No palpitations since Metoprolol dose was adjusted. Denies severe/frequent headache, visual changes, chest pain, dyspnea, claudication, focal weakness, or edema.   Obesity:  Dietary changes since last OV: She think she is eating healthy but frustrated because she keeps gining wt. Exercise: Engineer, agricultural.     She is is concerned about foot getting "red",bilateral. It seems to be worse after prolonged sitting and on dorsum. No pain,numbnees,or tingling. No cyanosis. This has been going on intermittently for about a year.  She wonders if this is related to varicose veins. She has right and left thigh spider veins.  She is also c/o bilateral heel pain for "a while."  It is worse in the morning when she first gets up and starts walking or after prolonged rest. No Hx of trauma. No local edema or erythema. She has not tried OTC med. Stretching exercises before walking in the morning help some. Problem seems to be getting worse.   Review of Systems  Constitutional: Positive for fatigue. Negative for activity change, appetite change and fever.  HENT: Negative for mouth sores, nosebleeds and trouble swallowing.   Eyes: Negative for redness and visual disturbance.  Respiratory: Negative for cough, shortness of breath and wheezing.   Cardiovascular: Negative for chest pain, palpitations and leg swelling.  Gastrointestinal: Negative for abdominal pain, nausea and vomiting.       Negative for changes in bowel habits.  Genitourinary: Negative for decreased urine volume and hematuria.  Musculoskeletal: Negative for gait problem and  joint swelling.  Skin: Negative for rash and wound.  Neurological: Negative for syncope, weakness and headaches.    Current Outpatient Medications on File Prior to Visit  Medication Sig Dispense Refill  . omeprazole (PRILOSEC) 20 MG capsule Take 1 capsule by mouth as needed.   3  . ranitidine (ZANTAC) 150 MG tablet Take 150 mg by mouth at bedtime.  12  . SPRINTEC 28 0.25-35 MG-MCG tablet Take 1 tablet by mouth at bedtime.   4   No current facility-administered medications on file prior to visit.      Past Medical History:  Diagnosis Date  . Hypertension    Allergies  Allergen Reactions  . Nickel Rash  . Strawberry Extract Anaphylaxis and Rash    Social History   Socioeconomic History  . Marital status: Married    Spouse name: Not on file  . Number of children: Not on file  . Years of education: Not on file  . Highest education level: Not on file  Occupational History  . Not on file  Social Needs  . Financial resource strain: Not on file  . Food insecurity:    Worry: Not on file    Inability: Not on file  . Transportation needs:    Medical: Not on file    Non-medical: Not on file  Tobacco Use  . Smoking status: Never Smoker  . Smokeless tobacco: Never Used  Substance and Sexual Activity  . Alcohol use: No  . Drug use: No  . Sexual activity:  Yes    Birth control/protection: Pill  Lifestyle  . Physical activity:    Days per week: Not on file    Minutes per session: Not on file  . Stress: Not on file  Relationships  . Social connections:    Talks on phone: Not on file    Gets together: Not on file    Attends religious service: Not on file    Active member of club or organization: Not on file    Attends meetings of clubs or organizations: Not on file    Relationship status: Not on file  Other Topics Concern  . Not on file  Social History Narrative  . Not on file    Vitals:   02/28/18 0906  BP: 124/82  Pulse: 86  Resp: 12  Temp: 99.3 F (37.4 C)    SpO2: 97%   Body mass index is 42.27 kg/m.   Wt Readings from Last 3 Encounters:  02/28/18 254 lb (115.2 kg)  09/29/16 249 lb 6 oz (113.1 kg)  06/25/16 246 lb (111.6 kg)      Physical Exam  Nursing note and vitals reviewed. Constitutional: She is oriented to person, place, and time. She appears well-developed. No distress.  HENT:  Head: Normocephalic and atraumatic.  Mouth/Throat: Oropharynx is clear and moist and mucous membranes are normal.  Eyes: Pupils are equal, round, and reactive to light. Conjunctivae are normal.  Cardiovascular: Normal rate and regular rhythm.  No murmur heard. Pulses:      Dorsalis pedis pulses are 2+ on the right side, and 2+ on the left side.  Respiratory: Effort normal and breath sounds normal. No respiratory distress.  GI: Soft. She exhibits no mass. There is no hepatomegaly. There is no tenderness.  Musculoskeletal: She exhibits no edema.  Bilateral tenderness upon palpation of heel mainly at the medial insertion of plantar fascia into calcaneous. There is no pain with palpation along planta fascia towards forefoot. small bunion  Dorsal flexion of first MTP elicits pain.  No edema or erythema appreciated on area.   Lymphadenopathy:    She has no cervical adenopathy.  Neurological: She is alert and oriented to person, place, and time. She has normal strength. No cranial nerve deficit. Gait normal.  Skin: Skin is warm. No rash noted. No erythema.  Psychiatric: She has a normal mood and affect.  Well groomed, good eye contact.       ASSESSMENT AND PLAN:   [name] was seen today for follow-up.  Orders Placed This Encounter  Procedures  . Lipid panel  . Basic metabolic panel   Lab Results  Component Value Date   CREATININE 0.80 02/28/2018   BUN 12 02/28/2018   NA 136 02/28/2018   K 4.6 02/28/2018   CL 104 02/28/2018   CO2 25 02/28/2018   Lab Results  Component Value Date   CHOL 164 02/28/2018   HDL 38.10 (L)  02/28/2018   LDLCALC 87 02/28/2018   TRIG 195.0 (H) 02/28/2018   CHOLHDL 4 02/28/2018   The 10-year ASCVD risk score Denman George DC Jr., et al., 2013) is: 1.9%   Values used to calculate the score:     Age: 65 years     Sex: Female     Is Non-Hispanic African American: No     Diabetic: No     Tobacco smoker: No     Systolic Blood Pressure: 124 mmHg     Is BP treated: Yes     HDL  Cholesterol: 38.1 mg/dL     Total Cholesterol: 164 mg/dL  Essential hypertension BP is adequately controlled. No changes in metoprolol succinate 50 mg daily. Instructed to monitor BP periodically. Continue low-salt diet. I think is appropriate to continue following annually, before if needed.  Sinus tachycardia She has been asymptomatic since metoprolol was adjusted. No changes in current management. Instructed about warning signs. Follow-up in 1 year.  Morbid obesity with BMI of 40.0-44.9, adult [facility]) She is exercising regularly. Recommend keeping a food diary. We discussed adverse effects of obesity.   Screening for lipid disorders - Lipid panel  Plantar fasciitis Educated about diagnosis. Recommend night splints, plantar stretching exercises, and inserts. If symptoms are persistent, she needs to schedule appointment with podiatrist.  Rest of foot exam normal,including pulses and capillary refill. She will continue monitoring.   Jeannemarie Sawaya G. [place], MD  [facility] [place] office.

## 2018-02-28 ENCOUNTER — Ambulatory Visit (INDEPENDENT_AMBULATORY_CARE_PROVIDER_SITE_OTHER): Payer: 59 | Admitting: Family Medicine

## 2018-02-28 ENCOUNTER — Encounter: Payer: Self-pay | Admitting: Family Medicine

## 2018-02-28 VITALS — BP 124/82 | HR 86 | Temp 99.3°F | Resp 12 | Ht 65.0 in | Wt 254.0 lb

## 2018-02-28 DIAGNOSIS — Z13220 Encounter for screening for lipoid disorders: Secondary | ICD-10-CM | POA: Diagnosis not present

## 2018-02-28 DIAGNOSIS — R000 Tachycardia, unspecified: Secondary | ICD-10-CM

## 2018-02-28 DIAGNOSIS — I10 Essential (primary) hypertension: Secondary | ICD-10-CM | POA: Diagnosis not present

## 2018-02-28 DIAGNOSIS — Z6841 Body Mass Index (BMI) 40.0 and over, adult: Secondary | ICD-10-CM

## 2018-02-28 DIAGNOSIS — M722 Plantar fascial fibromatosis: Secondary | ICD-10-CM

## 2018-02-28 LAB — BASIC METABOLIC PANEL
BUN: 12 mg/dL (ref 6–23)
CALCIUM: 9.1 mg/dL (ref 8.4–10.5)
CO2: 25 mEq/L (ref 19–32)
Chloride: 104 mEq/L (ref 96–112)
Creatinine, Ser: 0.8 mg/dL (ref 0.40–1.20)
GFR: 80.68 mL/min (ref >60.00)
GLUCOSE: 91 mg/dL (ref 70–99)
Potassium: 4.6 mEq/L (ref 3.5–5.1)
Sodium: 136 mEq/L (ref 135–145)

## 2018-02-28 LAB — LIPID PANEL
CHOL/HDL RATIO: 4
CHOLESTEROL: 164 mg/dL (ref 0–200)
HDL: 38.1 mg/dL — ABNORMAL LOW (ref >39.00)
LDL Cholesterol: 87 mg/dL (ref 0–99)
NonHDL: 125.71
TRIGLYCERIDES: 195 mg/dL — AB (ref 0.0–149.0)
VLDL: 39 mg/dL (ref 0.0–40.0)

## 2018-02-28 MED ORDER — METOPROLOL SUCCINATE ER 50 MG PO TB24: 50.0000 mg | ORAL_TABLET | Freq: Every day | ORAL | 3 refills | Status: DC

## 2018-02-28 NOTE — Assessment & Plan Note (Signed)
BP is adequately controlled. No changes in metoprolol succinate 50 mg daily. Instructed to monitor BP periodically. Continue low-salt diet. I think is appropriate to continue following annually, before if needed.

## 2018-02-28 NOTE — Assessment & Plan Note (Signed)
She has been asymptomatic since metoprolol was adjusted. No changes in current management. Instructed about warning signs. Follow-up in 1 year.

## 2018-02-28 NOTE — Assessment & Plan Note (Signed)
She is exercising regularly. Recommend keeping a food diary. We discussed adverse effects of obesity.

## 2018-02-28 NOTE — Patient Instructions (Addendum)
A few things to remember from today's visit:   Essential hypertension - Plan: Basic metabolic panel, metoprolol succinate (TOPROL-XL) 50 MG 24 hr tablet  Sinus tachycardia  Screening for lipid disorders - Plan: Lipid panel  Plantar fasciitis   [name], today we have followed on some of your chronic medical problems and they seem to be stable, so no changes in current management today.  Review medication list and be sure it is accurate.  -Remember a healthy diet and regular physical activity are very important for prevention as well as for well being; they also help with many chronic problems, decreasing the need of adding new medications and delaying or preventing possible complications.  I will see you back in 12 months.  Remember to arrange your follow up appt before leaving today.  Please follow sooner than planned if a new concern arises.  Plantar Fasciitis Plantar fasciitis is a painful foot condition that affects the heel. It occurs when the band of tissue that connects the toes to the heel bone (plantar fascia) becomes irritated. This can happen after exercising too much or doing other repetitive activities (overuse injury). The pain from plantar fasciitis can range from mild irritation to severe pain that makes it difficult for you to walk or move. The pain is usually worse in the morning or after you have been sitting or lying down for a while. What are the causes? This condition may be caused by:  Standing for long periods of time.  Wearing shoes that do not fit.  Doing high-impact activities, including running, aerobics, and ballet.  Being overweight.  Having an abnormal way of walking (gait).  Having tight calf muscles.  Having high arches in your feet.  Starting a new athletic activity.  What are the signs or symptoms? The main symptom of this condition is heel pain. Other symptoms include:  Pain that gets worse after activity or  exercise.  Pain that is worse in the morning or after resting.  Pain that goes away after you walk for a few minutes.  How is this diagnosed? This condition may be diagnosed based on your signs and symptoms. Your health care provider will also do a physical exam to check for:  A tender area on the bottom of your foot.  A high arch in your foot.  Pain when you move your foot.  Difficulty moving your foot.  You may also need to have imaging studies to confirm the diagnosis. These can include:  X-rays.  Ultrasound.  MRI.  How is this treated? Treatment for plantar fasciitis depends on the severity of the condition. Your treatment may include:  Rest, ice, and over-the-counter pain medicines to manage your pain.  Exercises to stretch your calves and your plantar fascia.  A splint that holds your foot in a stretched, upward position while you sleep (night splint).  Physical therapy to relieve symptoms and prevent problems in the future.  Cortisone injections to relieve severe pain.  Extracorporeal shock wave therapy (ESWT) to stimulate damaged plantar fascia with electrical impulses. It is often used as a last resort before surgery.  Surgery, if other treatments have not worked after 12 months.  Follow these instructions at home:  Take medicines only as directed by your health care provider.  Avoid activities that cause pain.  Roll the bottom of your foot over a bag of ice or a bottle of cold water. Do this for 20 minutes, 3-4 times a day.  Perform simple stretches  as directed by your health care provider.  Try wearing athletic shoes with air-sole or gel-sole cushions or soft shoe inserts.  Wear a night splint while sleeping, if directed by your health care provider.  Keep all follow-up appointments with your health care provider. How is this prevented?  Do not perform exercises or activities that cause heel pain.  Consider finding low-impact activities if you  continue to have problems.  Lose weight if you need to. The best way to prevent plantar fasciitis is to avoid the activities that aggravate your plantar fascia. Contact a health care provider if:  Your symptoms do not go away after treatment with home care measures.  Your pain gets worse.  Your pain affects your ability to move or do your daily activities. This information is not intended to replace advice given to you by your health care provider. Make sure you discuss any questions you have with your health care provider. Document Released: 03/03/2001 Document Revised: 11/11/2015 Document Reviewed: 04/18/2014 Elsevier Interactive Patient Education  2018 [employer].  Please be sure medication list is accurate. If a new problem present, please set up appointment sooner than planned today.

## 2018-03-03 ENCOUNTER — Encounter: Payer: Self-pay | Admitting: Family Medicine

## 2018-03-29 DIAGNOSIS — M25562 Pain in left knee: Secondary | ICD-10-CM | POA: Diagnosis not present

## 2018-04-08 DIAGNOSIS — M25562 Pain in left knee: Secondary | ICD-10-CM | POA: Diagnosis not present

## 2018-04-13 DIAGNOSIS — M1712 Unilateral primary osteoarthritis, left knee: Secondary | ICD-10-CM | POA: Diagnosis not present

## 2018-04-13 DIAGNOSIS — M25562 Pain in left knee: Secondary | ICD-10-CM | POA: Diagnosis not present

## 2018-05-05 DIAGNOSIS — M25562 Pain in left knee: Secondary | ICD-10-CM | POA: Diagnosis not present

## 2018-05-05 DIAGNOSIS — M1712 Unilateral primary osteoarthritis, left knee: Secondary | ICD-10-CM | POA: Diagnosis not present

## 2018-05-16 DIAGNOSIS — M228X2 Other disorders of patella, left knee: Secondary | ICD-10-CM | POA: Diagnosis not present

## 2018-05-16 DIAGNOSIS — G8918 Other acute postprocedural pain: Secondary | ICD-10-CM | POA: Diagnosis not present

## 2018-05-16 DIAGNOSIS — M1712 Unilateral primary osteoarthritis, left knee: Secondary | ICD-10-CM | POA: Diagnosis not present

## 2018-07-04 DIAGNOSIS — Z124 Encounter for screening for malignant neoplasm of cervix: Secondary | ICD-10-CM | POA: Diagnosis not present

## 2018-07-04 DIAGNOSIS — Z1231 Encounter for screening mammogram for malignant neoplasm of breast: Secondary | ICD-10-CM | POA: Diagnosis not present

## 2018-07-04 DIAGNOSIS — Z01419 Encounter for gynecological examination (general) (routine) without abnormal findings: Secondary | ICD-10-CM | POA: Diagnosis not present

## 2018-07-05 LAB — HM MAMMOGRAPHY

## 2018-07-08 ENCOUNTER — Encounter: Payer: Self-pay | Admitting: Family Medicine

## 2018-08-31 DIAGNOSIS — M1712 Unilateral primary osteoarthritis, left knee: Secondary | ICD-10-CM | POA: Diagnosis not present

## 2018-08-31 DIAGNOSIS — M25562 Pain in left knee: Secondary | ICD-10-CM | POA: Diagnosis not present

## 2018-09-06 ENCOUNTER — Other Ambulatory Visit: Payer: Self-pay | Admitting: Occupational Medicine

## 2018-09-06 ENCOUNTER — Ambulatory Visit: Payer: Self-pay

## 2018-09-06 ENCOUNTER — Other Ambulatory Visit: Payer: Self-pay

## 2018-09-06 DIAGNOSIS — M25572 Pain in left ankle and joints of left foot: Secondary | ICD-10-CM

## 2018-09-08 DIAGNOSIS — M1712 Unilateral primary osteoarthritis, left knee: Secondary | ICD-10-CM | POA: Diagnosis not present

## 2018-09-14 DIAGNOSIS — M1712 Unilateral primary osteoarthritis, left knee: Secondary | ICD-10-CM | POA: Diagnosis not present

## 2019-02-20 ENCOUNTER — Other Ambulatory Visit: Payer: Self-pay | Admitting: Family Medicine

## 2019-02-20 DIAGNOSIS — I10 Essential (primary) hypertension: Secondary | ICD-10-CM

## 2019-03-01 ENCOUNTER — Other Ambulatory Visit: Payer: Self-pay

## 2019-03-01 ENCOUNTER — Encounter: Payer: Self-pay | Admitting: Family Medicine

## 2019-03-01 ENCOUNTER — Ambulatory Visit (INDEPENDENT_AMBULATORY_CARE_PROVIDER_SITE_OTHER): Payer: 59 | Admitting: Family Medicine

## 2019-03-01 VITALS — BP 150/92 | HR 84 | Temp 98.4°F | Resp 12 | Ht 65.0 in | Wt 255.0 lb

## 2019-03-01 DIAGNOSIS — I10 Essential (primary) hypertension: Secondary | ICD-10-CM

## 2019-03-01 DIAGNOSIS — E559 Vitamin D deficiency, unspecified: Secondary | ICD-10-CM

## 2019-03-01 DIAGNOSIS — R600 Localized edema: Secondary | ICD-10-CM

## 2019-03-01 DIAGNOSIS — R000 Tachycardia, unspecified: Secondary | ICD-10-CM | POA: Diagnosis not present

## 2019-03-01 DIAGNOSIS — E785 Hyperlipidemia, unspecified: Secondary | ICD-10-CM | POA: Diagnosis not present

## 2019-03-01 DIAGNOSIS — Z6841 Body Mass Index (BMI) 40.0 and over, adult: Secondary | ICD-10-CM

## 2019-03-01 LAB — CBC
HCT: 37 % (ref 36.0–46.0)
Hemoglobin: 12.9 g/dL (ref 12.0–15.0)
MCHC: 34.8 g/dL (ref 30.0–36.0)
MCV: 89.7 fl (ref 78.0–100.0)
Platelets: 278 10*3/uL (ref 150.0–400.0)
RBC: 4.13 Mil/uL (ref 3.87–5.11)
RDW: 12.8 % (ref 11.5–15.5)
WBC: 6 10*3/uL (ref 4.0–10.5)

## 2019-03-01 LAB — LIPID PANEL
Cholesterol: 155 mg/dL (ref 0–200)
HDL: 37.5 mg/dL — ABNORMAL LOW (ref >39.00)
LDL Cholesterol: 87 mg/dL (ref 0–99)
NonHDL: 117.18
Total CHOL/HDL Ratio: 4
Triglycerides: 152 mg/dL — ABNORMAL HIGH (ref 0.0–149.0)
VLDL: 30.4 mg/dL (ref 0.0–40.0)

## 2019-03-01 LAB — COMPREHENSIVE METABOLIC PANEL
ALT: 15 U/L (ref 0–35)
AST: 14 U/L (ref 0–37)
Albumin: 3.8 g/dL (ref 3.5–5.2)
Alkaline Phosphatase: 49 U/L (ref 39–117)
BUN: 13 mg/dL (ref 6–23)
CO2: 23 mEq/L (ref 19–32)
Calcium: 8.9 mg/dL (ref 8.4–10.5)
Chloride: 107 mEq/L (ref 96–112)
Creatinine, Ser: 0.75 mg/dL (ref 0.40–1.20)
GFR: 81.45 mL/min (ref >60.00)
Glucose, Bld: 101 mg/dL — ABNORMAL HIGH (ref 70–99)
Potassium: 4.1 mEq/L (ref 3.5–5.1)
Sodium: 138 mEq/L (ref 135–145)
Total Bilirubin: 0.3 mg/dL (ref 0.2–1.2)
Total Protein: 6.1 g/dL (ref 6.0–8.3)

## 2019-03-01 LAB — VITAMIN D 25 HYDROXY (VIT D DEFICIENCY, FRACTURES): VITD: 11.11 ng/mL — ABNORMAL LOW (ref 30.00–100.00)

## 2019-03-01 LAB — TSH: TSH: 3.01 u[IU]/mL (ref 0.35–4.50)

## 2019-03-01 MED ORDER — METOPROLOL SUCCINATE ER 50 MG PO TB24: 50.0000 mg | ORAL_TABLET | Freq: Every day | ORAL | 3 refills | Status: DC

## 2019-03-01 NOTE — Progress Notes (Signed)
HPI:   Ms.Nikala Caryl Sieja is a 53 y.o. female, who is here today for chronic disease management. She was last seen on 02/28/2018. Since her last visit she has under gone left total knee replacement.  Hyperlipidemia, currently she is on nonpharmacologic treatment.  Lab Results  Component Value Date   CHOL 164 02/28/2018   HDL 38.10 (L) 02/28/2018   LDLCALC 87 02/28/2018   TRIG 195.0 (H) 02/28/2018   CHOLHDL 4 02/28/2018   Hypertension and sinus tachycardia, she is on metoprolol succinate 50 mg daily. She is tolerating the medication well. BP is elevated today, she reporting "good" BPs at work. 130s-140s/80s. Denies severe/frequent headache, visual changes, chest pain, dyspnea, palpitation, claudication, or focal weakness.  In regard to palpitations she "rarely" has episodes.  Lab Results  Component Value Date   CREATININE 0.80 02/28/2018   BUN 12 02/28/2018   NA 136 02/28/2018   K 4.6 02/28/2018   CL 104 02/28/2018   CO2 25 02/28/2018    She is also concerned about ankle edema usually at the end of the day. It seems to be better when she wears compression stockings. She has not noted erythema or pain. Problem has been going on for a few months.  Vit D deficiency,she is not on Vit D supplementation. 25 OH vit D in 2012 was 19.5.  Review of Systems  Constitutional: Negative for chills and fever.  HENT: Negative for mouth sores, nosebleeds and sore throat.   Respiratory: Negative for cough and wheezing.   Gastrointestinal: Negative for abdominal pain, diarrhea and vomiting.       No changes in bowel habits.  Genitourinary: Negative for decreased urine volume, dysuria and hematuria.  Musculoskeletal: Negative for gait problem and myalgias.  Skin: Negative for rash and wound.  Neurological: Negative for syncope and facial asymmetry.  Rest see pertinent positives and negatives per HPI.   Current Outpatient Medications on File Prior to Visit  Medication Sig  Dispense Refill   omeprazole (PRILOSEC) 20 MG capsule Take 1 capsule by mouth as needed.   3   SPRINTEC 28 0.25-35 MG-MCG tablet Take 1 tablet by mouth at bedtime.   4   meloxicam (MOBIC) 7.5 MG tablet meloxicam 7.5 mg tablet  TAKE 1 TABLET BY MOUTH EVERY DAY WITH MEALS     No current facility-administered medications on file prior to visit.      Past Medical History:  Diagnosis Date   Hypertension    Allergies  Allergen Reactions   Nickel Rash   Strawberry Extract Anaphylaxis and Rash    Social History   Socioeconomic History   Marital status: Married    Spouse name: Not on file   Number of children: Not on file   Years of education: Not on file   Highest education level: Not on file  Occupational History   Not on file  Social Needs   Financial resource strain: Not on file   Food insecurity    Worry: Not on file    Inability: Not on file   Transportation needs    Medical: Not on file    Non-medical: Not on file  Tobacco Use   Smoking status: Never Smoker   Smokeless tobacco: Never Used  Substance and Sexual Activity   Alcohol use: No   Drug use: No   Sexual activity: Yes    Birth control/protection: Pill  Lifestyle   Physical activity    Days per week: Not on file  Minutes per session: Not on file   Stress: Not on file  Relationships   Social connections    Talks on phone: Not on file    Gets together: Not on file    Attends religious service: Not on file    Active member of club or organization: Not on file    Attends meetings of clubs or organizations: Not on file    Relationship status: Not on file  Other Topics Concern   Not on file  Social History Narrative   Not on file    Vitals:   03/01/19 0935 03/01/19 0940  BP: (!) 160/100 (!) 150/92  Pulse: 84   Resp: 12   Temp: 98.4 F (36.9 C)   SpO2: 97%    Body mass index is 42.43 kg/m.   Physical Exam  Nursing note and vitals reviewed. Constitutional: She is  oriented to person, place, and time. She appears well-developed. No distress.  HENT:  Head: Normocephalic and atraumatic.  Mouth/Throat: Oropharynx is clear and moist and mucous membranes are normal.  Eyes: Pupils are equal, round, and reactive to light. Conjunctivae are normal.  Cardiovascular: Normal rate and regular rhythm.  No murmur heard. Pulses:      Dorsalis pedis pulses are 2+ on the right side and 2+ on the left side.  Respiratory: Effort normal and breath sounds normal. No respiratory distress.  GI: Soft. She exhibits no mass. There is no hepatomegaly. There is no abdominal tenderness.  Musculoskeletal:        General: No edema.  Lymphadenopathy:    She has no cervical adenopathy.  Neurological: She is alert and oriented to person, place, and time. She has normal strength. No cranial nerve deficit. Gait normal.  Skin: Skin is warm. No rash noted. No erythema.  Psychiatric: She has a normal mood and affect.  Well groomed, good eye contact.    ASSESSMENT AND PLAN:  Ms. Manson PasseyBrown was seen today for chronic disease management.  Diagnoses and all orders for this visit:  Lab Results  Component Value Date   CHOL 155 03/01/2019   HDL 37.50 (L) 03/01/2019   LDLCALC 87 03/01/2019   TRIG 152.0 (H) 03/01/2019   CHOLHDL 4 03/01/2019   Lab Results  Component Value Date   CREATININE 0.75 03/01/2019   BUN 13 03/01/2019   NA 138 03/01/2019   K 4.1 03/01/2019   CL 107 03/01/2019   CO2 23 03/01/2019   Lab Results  Component Value Date   TSH 3.01 03/01/2019   Lab Results  Component Value Date   WBC 6.0 03/01/2019   HGB 12.9 03/01/2019   HCT 37.0 03/01/2019   MCV 89.7 03/01/2019   PLT 278.0 03/01/2019   Lab Results  Component Value Date   ALT 15 03/01/2019   AST 14 03/01/2019   ALKPHOS 49 03/01/2019   BILITOT 0.3 03/01/2019    Essential hypertension BP elevated today but she is reporting "good" BP at home and when checked at work. Re-checked 3rd time 150/95  LUE. We discussed complications of elevated BP. No changes in current management for now,continue monitoring BP daily. Instructed about warning signs. She prefers to follow annually.  -     Comprehensive metabolic panel -     metoprolol succinate (TOPROL-XL) 50 MG 24 hr tablet; Take 1 tablet (50 mg total) by mouth daily. Take with or immediately following a meal.  Sinus tachycardia Well controlled with Metoprolol Succinate 50 mg daily.  -     TSH -  CBC  Hyperlipidemia, unspecified hyperlipidemia type Continue non pharmacologic treatment. Further recommendations according to 10 year CAD risk.  The 10-year ASCVD risk score Mikey Bussing DC Brooke Bonito., et al., 2013) is: 2.8%   Values used to calculate the score:     Age: 24 years     Sex: Female     Is Non-Hispanic African American: No     Diabetic: No     Tobacco smoker: No     Systolic Blood Pressure: 637 mmHg     Is BP treated: Yes     HDL Cholesterol: 37.5 mg/dL     Total Cholesterol: 155 mg/dL  -     Comprehensive metabolic panel -     Lipid panel  Vitamin D deficiency, unspecified Further recommendations will be given according to 25 OH vit D results.  -     Comprehensive metabolic panel -     VITAMIN D 25 Hydroxy (Vit-D Deficiency, Fractures)  Bilateral lower extremity edema No edema appreciated today. ?Vein disease. Other possible etiologies discussed. LE elevation and compression stocking recommended. Instructed about warning signs.  Morbid obesity with BMI of 40.0-44.9, adult (HCC) Wt stable. We discussed benefits of wt loss as well as adverse effects of obesity. Consistency with healthy diet and physical activity recommended.   Return in about 1 year (around 02/29/2020), or Before depending on lab results, for cpe and f/u.    -Ms. Itzia Cunliffe was advised to return sooner than planned today if new concerns arise.       Lyza Houseworth G. Martin[place], MD  [facility]. [place]  office.

## 2019-03-01 NOTE — Patient Instructions (Signed)
A few things to remember from today's visit:   Essential hypertension - Plan: Comprehensive metabolic panel  Sinus tachycardia - Plan: TSH, CBC  Hyperlipidemia, unspecified hyperlipidemia type - Plan: Comprehensive metabolic panel, Lipid panel  Vitamin D deficiency, unspecified - Plan: Comprehensive metabolic panel, VITAMIN D 25 Hydroxy (Vit-D Deficiency, Fractures)  No changes in your medications today but because your blood pressure was mildly elevated, please check blood pressure periodically and let me know the readings in about 2 weeks.  Please be sure medication list is accurate. If a new problem present, please set up appointment sooner than planned today.

## 2019-03-02 ENCOUNTER — Encounter: Payer: Self-pay | Admitting: Family Medicine

## 2019-08-08 ENCOUNTER — Encounter: Payer: Self-pay | Admitting: Family Medicine

## 2019-09-06 ENCOUNTER — Encounter: Payer: Self-pay | Admitting: Family Medicine

## 2019-09-06 ENCOUNTER — Telehealth (INDEPENDENT_AMBULATORY_CARE_PROVIDER_SITE_OTHER): Payer: 59 | Admitting: Family Medicine

## 2019-09-06 DIAGNOSIS — I10 Essential (primary) hypertension: Secondary | ICD-10-CM

## 2019-09-06 DIAGNOSIS — F419 Anxiety disorder, unspecified: Secondary | ICD-10-CM | POA: Diagnosis not present

## 2019-09-06 DIAGNOSIS — R000 Tachycardia, unspecified: Secondary | ICD-10-CM

## 2019-09-06 MED ORDER — SERTRALINE HCL 50 MG PO TABS: 25.0000 mg | ORAL_TABLET | Freq: Every day | ORAL | 1 refills | Status: DC

## 2019-09-06 NOTE — Progress Notes (Signed)
Virtual Visit via Video Note   I connected with Amanda Mack on 09/06/19 by a video enabled telemedicine application and verified that I am speaking with the correct person using two identifiers.  Location patient: home Location provider:work office Persons participating in the virtual visit: patient, provider  I discussed the limitations of evaluation and management by telemedicine and the availability of in person appointments. The patient expressed understanding and agreed to proceed.   HPI: Amanda Mack is a 54 yo female with Hx of HTN and sinus tach c/o a couple episodes of diarrhea,nausea,and occasionally vomiting since 06/10/19. Her father died in 06/10/19, so she has been travelling periodically to check on her mother, [place]. Episodes seems to be exacerbated by stress, when she travels to [place] to help her mother. Symptoms lasted about 2 days. Difficulty staying asleep and decreased appetite. COVID 19 test negative.  She has not tried OTC treatments.  She thinks symptoms may be caused by anxiety. No past Hx of anxiety. Negative for suicidal thoughts. She has also felt her heart "racing" and BP mildly elevated,140's-150's/90. A few days ago at her dentist's office BP was 150/90. HT > 100/min. She is on Metoprolol Succinate 50 mg daily.  Currently she is asymptomatic.  Negative for fever,chills,sore throat,cough,wheezing,CP,SOB,abdominal pain,artrhalgias/myalgias,urinary symptoms,or skin rash. No unusual/severe headache,visual changes,or syncope.  Since her last visit she has lost about 10 Lb.  ROS: See pertinent positives and negatives per HPI.  Past Medical History:  Diagnosis Date  . Hypertension     Past Surgical History:  Procedure Laterality Date  . SHOULDER SURGERY      Family History  Adopted: Yes   Social History   Socioeconomic History  . Marital status: Married    Spouse name: Not on file  . Number of children: Not on file  . Years of education: Not on file   . Highest education level: Not on file  Occupational History  . Not on file  Tobacco Use  . Smoking status: Never Smoker  . Smokeless tobacco: Never Used  Substance and Sexual Activity  . Alcohol use: No  . Drug use: No  . Sexual activity: Yes    Birth control/protection: Pill  Other Topics Concern  . Not on file  Social History Narrative  . Not on file   Social Determinants of Health   Financial Resource Strain:   . Difficulty of Paying Living Expenses:   Food Insecurity:   . Worried About Programme researcher, broadcasting/film/video in the Last Year:   . Barista in the Last Year:   Transportation Needs:   . Freight forwarder (Medical):   Marland Kitchen Lack of Transportation (Non-Medical):   Physical Activity:   . Days of Exercise per Week:   . Minutes of Exercise per Session:   Stress:   . Feeling of Stress :   Social Connections:   . Frequency of Communication with Friends and Family:   . Frequency of Social Gatherings with Friends and Family:   . Attends Religious Services:   . Active Member of Clubs or Organizations:   . Attends Banker Meetings:   Marland Kitchen Marital Status:   Intimate Partner Violence:   . Fear of Current or Ex-Partner:   . Emotionally Abused:   Marland Kitchen Physically Abused:   . Sexually Abused:     Current Outpatient Medications:  .  meloxicam (MOBIC) 7.5 MG tablet, meloxicam 7.5 mg tablet  TAKE 1 TABLET BY MOUTH EVERY DAY WITH MEALS, Disp: ,  Rfl:  .  metoprolol succinate (TOPROL-XL) 50 MG 24 hr tablet, Take 1 tablet (50 mg total) by mouth daily. Take with or immediately following a meal., Disp: 90 tablet, Rfl: 3 .  omeprazole (PRILOSEC) 20 MG capsule, Take 1 capsule by mouth as needed. , Disp: , Rfl: 3 .  SPRINTEC 28 0.25-35 MG-MCG tablet, Take 1 tablet by mouth at bedtime. , Disp: , Rfl: 4 .  sertraline (ZOLOFT) 50 MG tablet, Take 0.5 tablets (25 mg total) by mouth daily., Disp: 30 tablet, Rfl: 1  EXAM:  VITALS per patient if applicable:N/A  GENERAL: alert,  oriented, appears well and in no acute distress  HEENT: atraumatic, conjunctiva clear, no obvious abnormalities on inspection.  NECK: normal movements of the head and neck  LUNGS: on inspection no signs of respiratory distress, breathing rate appears normal, no obvious gross SOB, gasping or wheezing  CV: no obvious cyanosis  Amanda: moves all visible extremities without noticeable abnormality  PSYCH/NEURO: pleasant and cooperative, labile when talking about her father's death but in general no obvious depression or anxiety, speech and thought processing grossly intact  ASSESSMENT AND PLAN:  Discussed the following assessment and plan:  Sinus tachycardia Problem is being aggravated by stress/anxiety. For now no changes in Metoprolol dose. Continue monitoring HR.  Essential hypertension BP has not been well controlled. For now she does not want to increase dose of Metoprolol. Hoping than once anxiety is better controlled BP will also improve. Continue low salt diet. Monitor BP regularly. If BP still elevated we will need to consider increasing dose of Metoprolol succinate or adding a new antihypertensive agent.  Anxiety disorder, unspecified type - Plan: sertraline (ZOLOFT) 50 MG tablet GI symptoms could certainly be caused by anxiety. She agrees with trying Sertraline, starting 25 mg and increasing to 50 mg in 2-3 weeks if tolerating well. We discussed side effects,including GI symptoms and suicidal thoughts.    I discussed the assessment and treatment plan with the patient. Amanda Mack was provided an opportunity to ask questions and all were answered. The patient agreed with the plan and demonstrated an understanding of the instructions.    Return in about 6 weeks (around 10/18/2019) for Anxiety, hypertension, and tachy.    Tinaya Ceballos [place], MD

## 2019-09-08 ENCOUNTER — Telehealth: Payer: Self-pay | Admitting: Family Medicine

## 2019-09-08 NOTE — Telephone Encounter (Signed)
Scheduled pt-May 4 at 11:30 per [place]

## 2019-10-23 ENCOUNTER — Other Ambulatory Visit: Payer: Self-pay

## 2019-10-24 ENCOUNTER — Ambulatory Visit (INDEPENDENT_AMBULATORY_CARE_PROVIDER_SITE_OTHER): Payer: 59 | Admitting: Family Medicine

## 2019-10-24 ENCOUNTER — Encounter: Payer: Self-pay | Admitting: Family Medicine

## 2019-10-24 VITALS — BP 140/80 | HR 96 | Temp 97.6°F | Resp 12 | Ht 65.0 in | Wt 243.5 lb

## 2019-10-24 DIAGNOSIS — I10 Essential (primary) hypertension: Secondary | ICD-10-CM

## 2019-10-24 DIAGNOSIS — F419 Anxiety disorder, unspecified: Secondary | ICD-10-CM | POA: Diagnosis not present

## 2019-10-24 MED ORDER — SERTRALINE HCL 25 MG PO TABS: 25.0000 mg | ORAL_TABLET | Freq: Every day | ORAL | 1 refills | Status: DC

## 2019-10-24 NOTE — Assessment & Plan Note (Signed)
BP adequately controlled. Continue metoprolol succinate 50 mg daily and low-salt diet. Continue monitoring BP regularly.

## 2019-10-24 NOTE — Patient Instructions (Signed)
A few things to remember from today's visit:  No changes today. We will re-evaluate Sertraline next visit.  If you need refills please call your pharmacy. Do not use My Chart to request refills or for acute issues that need immediate attention.    Please be sure medication list is accurate. If a new problem present, please set up appointment sooner than planned today.

## 2019-10-24 NOTE — Assessment & Plan Note (Signed)
Problem has improved. She does not feel like she needs a higher dose, so continue sertraline 25 mg daily. We will reevaluate next visit and decide if we want to start weaning of medication.

## 2019-10-24 NOTE — Progress Notes (Addendum)
HPI: [name] is a 54 y.o. female, who is here today for follow up.   She was last seen on 09/06/2019, when she was having some episodes of anxiety.  This was causing episodes of diarrhea, nausea, and occasionally vomiting.   Problem was aggravated by the death of her father and relation with her mother. Sertraline 25 mg was started, she has tolerated it well. She also feels like her mother attitude has improved, she is less irritable and more agreeable with having help. She feels like anxiety has improved. She has had one episode of diarrhea, caused by anxiety when she heard her mother and sister arguing while she was on the phone. No associated nausea or vomiting,   Depression screen [facility] 2/9 10/24/2019 10/24/2019 02/28/2018  Decreased Interest 0 0 0  Down, Depressed, Hopeless 0 0 0  PHQ - 2 Score 0 0 0  Altered sleeping 1 - -  Tired, decreased energy 0 - -  Change in appetite 0 - -  Feeling bad or failure about yourself  0 - -  Trouble concentrating 0 - -  Moving slowly or fidgety/restless 0 - -  Suicidal thoughts 0 - -  PHQ-9 Score 1 - -  Difficult doing work/chores Not difficult at all - -   GAD 7 : Generalized Anxiety Score 10/24/2019  Nervous, Anxious, on Edge 0  Control/stop worrying 0  Worry too much - different things 1  Trouble relaxing 1  Restless 0  Easily annoyed or irritable 0  Afraid - awful might happen 0  Total GAD 7 Score 2  Anxiety Difficulty Not difficult at all   BP was also elevated as well as HR. Currently she is on metoprolol succinate 50 mg daily. BP readings are better during weekends but in general <140/90.  Negative for unusual headache, CP, diaphoresis, dyspnea, or edema.  Review of Systems  Constitutional: Negative for activity change, appetite change and fatigue.  HENT: Negative for mouth sores, nosebleeds and sore throat.   Eyes: Negative for redness and visual disturbance.  Respiratory: Negative for cough and wheezing.    Gastrointestinal: Negative for abdominal pain, nausea and vomiting.  Genitourinary: Negative for decreased urine volume and hematuria.  Musculoskeletal: Negative for gait problem and myalgias.  Neurological: Negative for syncope, facial asymmetry and weakness.  Psychiatric/Behavioral: Positive for sleep disturbance. Negative for confusion.  Rest of ROS, see pertinent positives sand negatives in HPI  Current Outpatient Medications on File Prior to Visit  Medication Sig Dispense Refill  . meloxicam (MOBIC) 7.5 MG tablet meloxicam 7.5 mg tablet  TAKE 1 TABLET BY MOUTH EVERY DAY WITH MEALS    . metoprolol succinate (TOPROL-XL) 50 MG 24 hr tablet Take 1 tablet (50 mg total) by mouth daily. Take with or immediately following a meal. 90 tablet 3  . omeprazole (PRILOSEC) 20 MG capsule Take 1 capsule by mouth as needed.   3  . SPRINTEC 28 0.25-35 MG-MCG tablet Take 1 tablet by mouth at bedtime.   4   No current facility-administered medications on file prior to visit.   Past Medical History:  Diagnosis Date  . Hypertension    Allergies  Allergen Reactions  . Nickel Rash  . Strawberry Extract Anaphylaxis and Rash    Social History   Socioeconomic History  . Marital status: Married    Spouse name: Not on file  . Number of children: Not on file  . Years of education: Not on file  .  Highest education level: Not on file  Occupational History  . Not on file  Tobacco Use  . Smoking status: Never Smoker  . Smokeless tobacco: Never Used  Substance and Sexual Activity  . Alcohol use: No  . Drug use: No  . Sexual activity: Yes    Birth control/protection: Pill  Other Topics Concern  . Not on file  Social History Narrative  . Not on file   Social Determinants of Health   Financial Resource Strain:   . Difficulty of Paying Living Expenses:   Food Insecurity:   . Worried About Charity fundraiser in the Last Year:   . Arboriculturist in the Last Year:   Transportation Needs:   .  Film/video editor (Medical):   Marland Kitchen Lack of Transportation (Non-Medical):   Physical Activity:   . Days of Exercise per Week:   . Minutes of Exercise per Session:   Stress:   . Feeling of Stress :   Social Connections:   . Frequency of Communication with Friends and Family:   . Frequency of Social Gatherings with Friends and Family:   . Attends Religious Services:   . Active Member of Clubs or Organizations:   . Attends Archivist Meetings:   Marland Kitchen Marital Status:     Vitals:   10/24/19 1133  BP: 140/80  Pulse: 96  Resp: 12  Temp: 97.6 F (36.4 C)  SpO2: 96%   Body mass index is 40.52 kg/m.  Physical Exam  Nursing note and vitals reviewed. Constitutional: She is oriented to person, place, and time. She appears well-developed. No distress.  HENT:  Head: Normocephalic and atraumatic.  Mouth/Throat: Oropharynx is clear and moist and mucous membranes are normal.  Eyes: Conjunctivae are normal.  Cardiovascular: Normal rate and regular rhythm.  No murmur heard. Pulses:      Dorsalis pedis pulses are 2+ on the right side and 2+ on the left side.  Respiratory: Effort normal and breath sounds normal. No respiratory distress.  GI: Soft. She exhibits no mass. There is no hepatomegaly. There is no abdominal tenderness.  Musculoskeletal:        General: No edema.  Neurological: She is alert and oriented to person, place, and time. She has normal strength. No cranial nerve deficit. Gait normal.  Skin: Skin is warm. No rash noted. No erythema.  Psychiatric: She has a normal mood and affect.  Well groomed, good eye contact.   ASSESSMENT AND PLAN:  [name] was seen today for follow-up.  No orders of the defined types were placed in this encounter.  Essential hypertension BP adequately controlled. Continue metoprolol succinate 50 mg daily and low-salt diet. Continue monitoring BP regularly.   Anxiety disorder Problem has improved. She does not feel  like she needs a higher dose, so continue sertraline 25 mg daily. We will reevaluate next visit and decide if we want to start weaning of medication.   Return in about 5 months (around 03/25/2020).   Amanda Tuohy G. [place], MD  [facility]. [place] office.

## 2020-02-28 ENCOUNTER — Other Ambulatory Visit: Payer: Self-pay | Admitting: Family Medicine

## 2020-02-28 DIAGNOSIS — I10 Essential (primary) hypertension: Secondary | ICD-10-CM

## 2020-04-17 ENCOUNTER — Other Ambulatory Visit: Payer: Self-pay | Admitting: Family Medicine

## 2020-04-22 ENCOUNTER — Other Ambulatory Visit: Payer: Self-pay

## 2020-04-22 ENCOUNTER — Ambulatory Visit (INDEPENDENT_AMBULATORY_CARE_PROVIDER_SITE_OTHER): Payer: 59 | Admitting: Family Medicine

## 2020-04-22 ENCOUNTER — Encounter: Payer: Self-pay | Admitting: Family Medicine

## 2020-04-22 VITALS — BP 138/78 | HR 73 | Temp 98.8°F | Resp 16 | Ht 65.0 in | Wt 251.4 lb

## 2020-04-22 DIAGNOSIS — Z1329 Encounter for screening for other suspected endocrine disorder: Secondary | ICD-10-CM

## 2020-04-22 DIAGNOSIS — Z0000 Encounter for general adult medical examination without abnormal findings: Secondary | ICD-10-CM | POA: Diagnosis not present

## 2020-04-22 DIAGNOSIS — Z1159 Encounter for screening for other viral diseases: Secondary | ICD-10-CM | POA: Diagnosis not present

## 2020-04-22 DIAGNOSIS — E559 Vitamin D deficiency, unspecified: Secondary | ICD-10-CM

## 2020-04-22 DIAGNOSIS — I10 Essential (primary) hypertension: Secondary | ICD-10-CM | POA: Diagnosis not present

## 2020-04-22 DIAGNOSIS — E785 Hyperlipidemia, unspecified: Secondary | ICD-10-CM

## 2020-04-22 DIAGNOSIS — Z13228 Encounter for screening for other metabolic disorders: Secondary | ICD-10-CM

## 2020-04-22 DIAGNOSIS — Z130 Encounter for screening for diseases of the blood and blood-forming organs and certain disorders involving the immune mechanism: Secondary | ICD-10-CM

## 2020-04-22 DIAGNOSIS — F419 Anxiety disorder, unspecified: Secondary | ICD-10-CM

## 2020-04-22 NOTE — Patient Instructions (Addendum)
Today you have you routine preventive visit. A few things to remember from today's visit:  Vitamin D deficiency, unspecified  Essential hypertension  Encounter for HCV screening test for low risk patient  Routine general medical examination at a health care facility  Screening for endocrine, metabolic and immunity disorder  If you need refills please call your pharmacy. Do not use My Chart to request refills or for acute issues that need immediate attention.   Please be sure medication list is accurate. If a new problem present, please set up appointment sooner than planned today.  At least 150 minutes of moderate exercise per week, daily brisk walking for 15-30 min is a good exercise option. Healthy diet low in saturated (animal) fats and sweets and consisting of fresh fruits and vegetables, lean meats such as fish and white chicken and whole grains.  These are some of recommendations for screening depending of age and risk factors:  - Vaccines:  Tdap vaccine every 10 years.  Shingles vaccine recommended at age 75, could be given after 52 years of age but not sure about insurance coverage.   Pneumonia vaccines: Pneumovax at 65. Sometimes Pneumovax is giving earlier if history of smoking, lung disease,diabetes,kidney disease among some.  Screening for diabetes at age 89 and every 3 years.  Cervical cancer prevention:  Pap smear starts at 52 years of age and continues periodically until 52 years old in low risk women. Pap smear every 3 years between 42 and 64 years old. Pap smear every 3-5 years between women 30 and older if pap smear negative and HPV screening negative.   -Breast cancer: Mammogram: There is disagreement between experts about when to start screening in low risk asymptomatic female but recent recommendations are to start screening at 39 and not later than 52 years old , every 1-2 years and after 52 yo q 2 years. Screening is recommended until 51 years old but  some women can continue screening depending of healthy issues.  Colon cancer screening: Has been recently changed to 52 yo. Insurance may not cover until you are 52 years old. Screening is recommended until 52 years old.  Cholesterol disorder screening at age 31 and every 3 years.  Also recommended:  1. Dental visit- Brush and floss your teeth twice daily; visit your dentist twice a year. 2. Eye doctor- Get an eye exam at least every 2 years. 3. Helmet use- Always wear a helmet when riding a bicycle, motorcycle, rollerblading or skateboarding. 4. Safe sex- If you may be exposed to sexually transmitted infections, use a condom. 5. Seat belts- Seat belts can save your live; always wear one. 6. Smoke/Carbon Monoxide detectors- These detectors need to be installed on the appropriate level of your home. Replace batteries at least once a year. 7. Skin cancer- When out in the sun please cover up and use sunscreen 15 SPF or higher. 8. Violence- If anyone is threatening or hurting you, please tell your healthcare provider.  9. Drink alcohol in moderation- Limit alcohol intake to one drink or less per day. Never drink and drive. 10. Calcium supplementation 1000 to 1200 mg daily, ideally through your diet.  Vitamin D supplementation 800 units daily.

## 2020-04-22 NOTE — Progress Notes (Signed)
HPI: [name] is a pleasant  54 y.o. female, who is here today for her routine physical.  Last CPE: > a year ago.  Regular exercise 3 or more time per week: Walks every other day and weekends, planning on starting aquatic zoomba. Following a healthy diet: yes. She lives with her husband.  Chronic medical problems: HTN,sinus tach,HLD,and vit D deficiency among some.  Pap smear: 07/04/18.  Immunization History  Administered Date(s) Administered  . PFIZER SARS-COV-2 Vaccination 04/19/2020  . Td 01/01/2015   Mammogram: 12/06/19 Colonoscopy: Within the past 10 years. DEXA: N/A  Hep C screening: Never.  She has no new concerns today.  Anxiety: Exacerbated by the death of her father. She has been on Sertraline 25 mg since 08/2019. She has tolerated well. She thinks medication ca now be discontinued. She is feeling better, her mother is doing well otherwise.  HTN and sinus tack: She is on Metoprolol Succinate 50 mg daily. Negative for CP,SOB,or focal weakness.  Component     Latest Ref Rng & Units 03/01/2019  Sodium     135 - 146 mmol/L 138  Potassium     3.5 - 5.3 mmol/L 4.1  Chloride     98 - 110 mmol/L 107  CO2     20 - 32 mmol/L 23  Glucose     65 - 99 mg/dL 093 (H)  BUN     7 - 25 mg/dL 13  Creatinine     2.67 - 1.05 mg/dL 1.24   HLD: She is on non pharmacologic treatment.  Component     Latest Ref Rng & Units 03/01/2019  Cholesterol     <200 mg/dL 580  Triglycerides     <150 mg/dL 998.3 (H)  HDL Cholesterol     > OR = 50 mg/dL 38.25 (L)  VLDL     0.0 - 40.0 mg/dL 05.3  LDL (calc)     0 - 99 mg/dL 87  Total CHOL/HDL Ratio     <5.0 (calc) 4  NonHDL      117.18   Vit D deficiency: Not consistent with vit D supplementation.  Review of Systems  Constitutional: Negative for appetite change, fatigue and fever.  HENT: Negative for dental problem, hearing loss, mouth sores and sore throat.   Eyes: Negative for redness and visual disturbance.   Respiratory: Negative for cough and wheezing.   Cardiovascular: Negative for palpitations and leg swelling.  Gastrointestinal: Negative for abdominal pain, nausea and vomiting.       No changes in bowel habits.  Endocrine: Negative for cold intolerance, heat intolerance, polydipsia, polyphagia and polyuria.  Genitourinary: Negative for decreased urine volume, dysuria, hematuria, vaginal bleeding and vaginal discharge.  Musculoskeletal: Positive for arthralgias. Negative for gait problem and myalgias.  Skin: Negative for color change and rash.  Allergic/Immunologic: Negative for environmental allergies.  Neurological: Negative for syncope, weakness and headaches.  Hematological: Negative for adenopathy. Does not bruise/bleed easily.  Psychiatric/Behavioral: Negative for confusion. The patient is not nervous/anxious.   All other systems reviewed and are negative.  Current Outpatient Medications on File Prior to Visit  Medication Sig Dispense Refill  . meloxicam (MOBIC) 7.5 MG tablet meloxicam 7.5 mg tablet  TAKE 1 TABLET BY MOUTH EVERY DAY WITH MEALS    . metoprolol succinate (TOPROL-XL) 50 MG 24 hr tablet TAKE 1 TABLET (50 MG TOTAL) BY MOUTH DAILY. TAKE WITH OR IMMEDIATELY FOLLOWING A MEAL. 90 tablet 3  . omeprazole (PRILOSEC) 20 MG capsule  Take 1 capsule by mouth as needed.   3  . sertraline (ZOLOFT) 25 MG tablet TAKE 1 TABLET BY MOUTH EVERY DAY 90 tablet 1  . SPRINTEC 28 0.25-35 MG-MCG tablet Take 1 tablet by mouth at bedtime.   4   No current facility-administered medications on file prior to visit.   Past Medical History:  Diagnosis Date  . Hypertension     Past Surgical History:  Procedure Laterality Date  . SHOULDER SURGERY      Allergies  Allergen Reactions  . Nickel Rash  . Strawberry Extract Anaphylaxis and Rash    Family History  Adopted: Yes    Social History   Socioeconomic History  . Marital status: Married    Spouse name: Not on file  . Number of  children: Not on file  . Years of education: Not on file  . Highest education level: Not on file  Occupational History  . Not on file  Tobacco Use  . Smoking status: Never Smoker  . Smokeless tobacco: Never Used  Substance and Sexual Activity  . Alcohol use: No  . Drug use: No  . Sexual activity: Yes    Birth control/protection: Pill  Other Topics Concern  . Not on file  Social History Narrative  . Not on file   Social Determinants of Health   Financial Resource Strain:   . Difficulty of Paying Living Expenses: Not on file  Food Insecurity:   . Worried About Programme researcher, broadcasting/film/video in the Last Year: Not on file  . Ran Out of Food in the Last Year: Not on file  Transportation Needs:   . Lack of Transportation (Medical): Not on file  . Lack of Transportation (Non-Medical): Not on file  Physical Activity:   . Days of Exercise per Week: Not on file  . Minutes of Exercise per Session: Not on file  Stress:   . Feeling of Stress : Not on file  Social Connections:   . Frequency of Communication with Friends and Family: Not on file  . Frequency of Social Gatherings with Friends and Family: Not on file  . Attends Religious Services: Not on file  . Active Member of Clubs or Organizations: Not on file  . Attends Banker Meetings: Not on file  . Marital Status: Not on file   Vitals:   04/22/20 0955  BP: 138/78  Pulse: 73  Resp: 16  Temp: 98.8 F (37.1 C)  SpO2: 98%   Body mass index is 41.83 kg/m.  Wt Readings from Last 3 Encounters:  04/22/20 251 lb 6 oz (114 kg)  10/24/19 243 lb 8 oz (110.5 kg)  03/01/19 255 lb (115.7 kg)   Physical Exam Vitals and nursing note reviewed.  Constitutional:      General: She is not in acute distress.    Appearance: She is well-developed.  HENT:     Head: Normocephalic and atraumatic.     Right Ear: Hearing, tympanic membrane, ear canal and external ear normal.     Left Ear: Hearing, tympanic membrane, ear canal and  external ear normal.     Mouth/Throat:     Mouth: Mucous membranes are moist.     Pharynx: Oropharynx is clear. Uvula midline.  Eyes:     Extraocular Movements: Extraocular movements intact.     Conjunctiva/sclera: Conjunctivae normal.     Pupils: Pupils are equal, round, and reactive to light.  Neck:     Thyroid: No thyromegaly.  Trachea: No tracheal deviation.  Cardiovascular:     Rate and Rhythm: Normal rate and regular rhythm.     Pulses:          Dorsalis pedis pulses are 2+ on the right side and 2+ on the left side.     Heart sounds: No murmur heard.   Pulmonary:     Effort: Pulmonary effort is normal. No respiratory distress.     Breath sounds: Normal breath sounds.  Abdominal:     Palpations: Abdomen is soft. There is no hepatomegaly or mass.     Tenderness: There is no abdominal tenderness.  Genitourinary:    Comments: Deferred to gyn. Musculoskeletal:     Comments: No major deformity or signs of synovitis appreciated.  Lymphadenopathy:     Cervical: No cervical adenopathy.     Upper Body:     Right upper body: No supraclavicular adenopathy.     Left upper body: No supraclavicular adenopathy.  Skin:    General: Skin is warm.     Findings: No erythema or rash.  Neurological:     General: No focal deficit present.     Mental Status: She is alert and oriented to person, place, and time.     Cranial Nerves: No cranial nerve deficit.     Coordination: Coordination normal.     Gait: Gait normal.     Deep Tendon Reflexes:     Reflex Scores:      Bicep reflexes are 2+ on the right side and 2+ on the left side.      Patellar reflexes are 2+ on the right side and 2+ on the left side. Psychiatric:        Mood and Affect: Mood and affect normal.     Comments: Well groomed, good eye contact.   ASSESSMENT AND PLAN:  [name] was here today annual physical examination.  Orders Placed This Encounter  Procedures  . COMPLETE METABOLIC PANEL WITH GFR  .  Lipid panel  . Hepatitis C antibody  . VITAMIN D 25 Hydroxy (Vit-D Deficiency, Fractures)   Lab Results  Component Value Date   CHOL 160 04/22/2020   HDL 37 (L) 04/22/2020   LDLCALC 91 04/22/2020   TRIG 232 (H) 04/22/2020   CHOLHDL 4.3 04/22/2020   Lab Results  Component Value Date   CREATININE 0.74 04/22/2020   BUN 14 04/22/2020   NA 138 04/22/2020   K 4.3 04/22/2020   CL 107 04/22/2020   CO2 24 04/22/2020   Lab Results  Component Value Date   ALT 19 04/22/2020   AST 21 04/22/2020   ALKPHOS 49 03/01/2019   BILITOT 0.3 04/22/2020    Routine general medical examination at a health care facility Encouraged to continue regular physical activity and healthful diet for prevention of chronic illness and/or complications. Preventive guidelines reviewed. Vaccination up to date. We need to obtain copy of last colonoscopy. Continue female preventive  Ca++ and vit D supplementation recommended. Next CPE in a year.  The 10-year ASCVD risk score Denman George DC Montez Hageman., et al., 2013) is: 2.8%   Values used to calculate the score:     Age: 27 years     Sex: Female     Is Non-Hispanic African American: No     Diabetic: No     Tobacco smoker: No     Systolic Blood Pressure: 138 mmHg     Is BP treated: Yes     HDL Cholesterol: 37 mg/dL  Total Cholesterol: 160 mg/dL  Vitamin D deficiency, unspecified Further recommendations according to 25 [place] vit D results.  Essential hypertension SBP closed to 140, I would like =< 130. For now continue Metoprolol Succinate 50 mg daily. Monitor BP regularly. Continue low salt diet.  Encounter for HCV screening test for low risk patient -     Hepatitis C antibody  Screening for endocrine, metabolic and immunity disorder -     COMPLETE METABOLIC PANEL WITH GFR  Hyperlipidemia, unspecified hyperlipidemia type Continue non pharmacologic treatment. Further recommendations according to 10 years CVD risk and lipid panel numbers.  Anxiety disorder,  unspecified type I think it is appropriate to start weaning off Sertraline as tolerated. If symptoms reoccured she can go back to prior dose and continue.   Return in 1 year (on 04/22/2021) for CPE.  Gwenivere Hiraldo G. [place], MD  [facility]. [place] [facility].  Today you have you routine preventive visit. A few things to remember from today's visit:  Vitamin D deficiency, unspecified  Essential hypertension  Encounter for HCV screening test for low risk patient  Routine general medical examination at a health care facility  Screening for endocrine, metabolic and immunity disorder  If you need refills please call your pharmacy. Do not use My Chart to request refills or for acute issues that need immediate attention.   Please be sure medication list is accurate. If a new problem present, please set up appointment sooner than planned today.  At least 150 minutes of moderate exercise per week, daily brisk walking for 15-30 min is a good exercise option. Healthy diet low in saturated (animal) fats and sweets and consisting of fresh fruits and vegetables, lean meats such as fish and white chicken and whole grains.  These are some of recommendations for screening depending of age and risk factors:  - Vaccines:  Tdap vaccine every 10 years.  Shingles vaccine recommended at age 560, could be given after 52 years of age but not sure about insurance coverage.   Pneumonia vaccines: Pneumovax at 65. Sometimes Pneumovax is giving earlier if history of smoking, lung disease,diabetes,kidney disease among some.  Screening for diabetes at age 52 and every 3 years.  Cervical cancer prevention:  Pap smear starts at 52 years of age and continues periodically until 52 years old in low risk women. Pap smear every 3 years between [address] and 52 years old. Pap smear every 3-5 years between women 30 and older if pap smear negative and HPV screening negative.   -Breast cancer: Mammogram: There is  disagreement between experts about when to start screening in low risk asymptomatic female but recent recommendations are to start screening at 5840 and not later than 52 years old , every 1-2 years and after 52 yo q 2 years. Screening is recommended until 52 years old but some women can continue screening depending of healthy issues.  Colon cancer screening: Has been recently changed to 52 yo. Insurance may not cover until you are 52 years old. Screening is recommended until 52 years old.  Cholesterol disorder screening at age 52 and every 3 years.  Also recommended:  1. Dental visit- Brush and floss your teeth twice daily; visit your dentist twice a year. 2. Eye doctor- Get an eye exam at least every 2 years. 3. Helmet use- Always wear a helmet when riding a bicycle, motorcycle, rollerblading or skateboarding. 4. Safe sex- If you may be exposed to sexually transmitted infections, use a condom. 5. Seat belts- Seat belts  can save your live; always wear one. 6. Smoke/Carbon Monoxide detectors- These detectors need to be installed on the appropriate level of your home. Replace batteries at least once a year. 7. Skin cancer- When out in the sun please cover up and use sunscreen 15 SPF or higher. 8. Violence- If anyone is threatening or hurting you, please tell your healthcare provider.  9. Drink alcohol in moderation- Limit alcohol intake to one drink or less per day. Never drink and drive. 10. Calcium supplementation 1000 to 1200 mg daily, ideally through your diet.  Vitamin D supplementation 800 units daily.

## 2020-04-23 LAB — LIPID PANEL
Cholesterol: 160 mg/dL (ref <200)
HDL: 37 mg/dL — ABNORMAL LOW (ref >=50)
LDL Cholesterol (Calc): 91 mg/dL (calc)
Non-HDL Cholesterol (Calc): 123 mg/dL (calc) (ref <130)
Total CHOL/HDL Ratio: 4.3 (calc) (ref <5.0)
Triglycerides: 232 mg/dL — ABNORMAL HIGH (ref <150)

## 2020-04-23 LAB — COMPLETE METABOLIC PANEL WITH GFR
AG Ratio: 1.5 (calc) (ref 1.0–2.5)
ALT: 19 U/L (ref 6–29)
AST: 21 U/L (ref 10–35)
Albumin: 3.8 g/dL (ref 3.6–5.1)
Alkaline phosphatase (APISO): 52 U/L (ref 37–153)
BUN: 14 mg/dL (ref 7–25)
CO2: 24 mmol/L (ref 20–32)
Calcium: 8.9 mg/dL (ref 8.6–10.4)
Chloride: 107 mmol/L (ref 98–110)
Creat: 0.74 mg/dL (ref 0.50–1.05)
GFR, Est African American: 108 mL/min/{1.73_m2} (ref >=60)
GFR, Est Non African American: 93 mL/min/{1.73_m2} (ref >=60)
Globulin: 2.5 g/dL (calc) (ref 1.9–3.7)
Glucose, Bld: 96 mg/dL (ref 65–99)
Potassium: 4.3 mmol/L (ref 3.5–5.3)
Sodium: 138 mmol/L (ref 135–146)
Total Bilirubin: 0.3 mg/dL (ref 0.2–1.2)
Total Protein: 6.3 g/dL (ref 6.1–8.1)

## 2020-04-23 LAB — VITAMIN D 25 HYDROXY (VIT D DEFICIENCY, FRACTURES): Vit D, 25-Hydroxy: 18 ng/mL — ABNORMAL LOW (ref 30–100)

## 2020-04-23 LAB — HEPATITIS C ANTIBODY
Hepatitis C Ab: NONREACTIVE
SIGNAL TO CUT-OFF: 0.01 (ref <1.00)

## 2020-08-28 ENCOUNTER — Telehealth: Payer: Self-pay | Admitting: Family Medicine

## 2020-08-28 ENCOUNTER — Other Ambulatory Visit (HOSPITAL_BASED_OUTPATIENT_CLINIC_OR_DEPARTMENT_OTHER): Payer: Self-pay

## 2020-08-28 MED ORDER — SERTRALINE HCL 25 MG PO TABS
25.0000 mg | ORAL_TABLET | Freq: Every day | ORAL | 1 refills | Status: DC
  Filled 2020-08-28: qty 90, 90d supply, fill #0

## 2020-08-28 MED ORDER — NORGESTIMATE-ETH ESTRADIOL 0.25-35 MG-MCG PO TABS
1.0000 | ORAL_TABLET | Freq: Every day | ORAL | 4 refills | Status: DC
  Filled 2020-08-28: qty 112, 84d supply, fill #0

## 2020-08-28 MED FILL — Metoprolol Succinate Tab ER 24HR 50 MG (Tartrate Equiv): ORAL | 90 days supply | Qty: 90 | Fill #0 | Status: AC

## 2020-08-28 NOTE — Telephone Encounter (Signed)
Patient needs a refill called in for Certriline 25 mg tablets taken 1 time daily  Pharmacy- [facility]

## 2020-08-28 NOTE — Telephone Encounter (Signed)
Rx sent in

## 2020-08-29 ENCOUNTER — Other Ambulatory Visit (HOSPITAL_BASED_OUTPATIENT_CLINIC_OR_DEPARTMENT_OTHER): Payer: Self-pay

## 2020-09-02 ENCOUNTER — Other Ambulatory Visit (HOSPITAL_BASED_OUTPATIENT_CLINIC_OR_DEPARTMENT_OTHER): Payer: Self-pay

## 2020-09-06 ENCOUNTER — Other Ambulatory Visit (HOSPITAL_BASED_OUTPATIENT_CLINIC_OR_DEPARTMENT_OTHER): Payer: Self-pay

## 2020-09-16 ENCOUNTER — Other Ambulatory Visit (HOSPITAL_BASED_OUTPATIENT_CLINIC_OR_DEPARTMENT_OTHER): Payer: Self-pay

## 2020-09-23 ENCOUNTER — Other Ambulatory Visit (HOSPITAL_COMMUNITY)
Admission: RE | Admit: 2020-09-23 | Discharge: 2020-09-23 | Disposition: A | Discharge status: Home / Self Care | Payer: No Typology Code available for payment source | Source: Ambulatory Visit | Attending: Obstetrics & Gynecology | Admitting: Obstetrics & Gynecology

## 2020-09-23 ENCOUNTER — Ambulatory Visit (INDEPENDENT_AMBULATORY_CARE_PROVIDER_SITE_OTHER): Payer: No Typology Code available for payment source | Admitting: Obstetrics & Gynecology

## 2020-09-23 ENCOUNTER — Other Ambulatory Visit (HOSPITAL_BASED_OUTPATIENT_CLINIC_OR_DEPARTMENT_OTHER): Payer: Self-pay

## 2020-09-23 ENCOUNTER — Other Ambulatory Visit: Payer: Self-pay

## 2020-09-23 ENCOUNTER — Encounter (HOSPITAL_BASED_OUTPATIENT_CLINIC_OR_DEPARTMENT_OTHER): Payer: Self-pay | Admitting: Obstetrics & Gynecology

## 2020-09-23 VITALS — BP 158/98 | HR 87 | Ht 64.5 in | Wt 232.0 lb

## 2020-09-23 DIAGNOSIS — N912 Amenorrhea, unspecified: Secondary | ICD-10-CM | POA: Diagnosis not present

## 2020-09-23 DIAGNOSIS — Z01419 Encounter for gynecological examination (general) (routine) without abnormal findings: Secondary | ICD-10-CM

## 2020-09-23 DIAGNOSIS — Z8742 Personal history of other diseases of the female genital tract: Secondary | ICD-10-CM

## 2020-09-23 DIAGNOSIS — I10 Essential (primary) hypertension: Secondary | ICD-10-CM | POA: Diagnosis not present

## 2020-09-23 DIAGNOSIS — Z124 Encounter for screening for malignant neoplasm of cervix: Secondary | ICD-10-CM | POA: Insufficient documentation

## 2020-09-23 DIAGNOSIS — E782 Mixed hyperlipidemia: Secondary | ICD-10-CM | POA: Insufficient documentation

## 2020-09-23 DIAGNOSIS — E785 Hyperlipidemia, unspecified: Secondary | ICD-10-CM | POA: Insufficient documentation

## 2020-09-23 DIAGNOSIS — E781 Pure hyperglyceridemia: Secondary | ICD-10-CM | POA: Diagnosis not present

## 2020-09-23 DIAGNOSIS — G43829 Menstrual migraine, not intractable, without status migrainosus: Secondary | ICD-10-CM

## 2020-09-23 DIAGNOSIS — Z658 Other specified problems related to psychosocial circumstances: Secondary | ICD-10-CM | POA: Diagnosis not present

## 2020-09-23 MED ORDER — SUMATRIPTAN SUCCINATE 100 MG PO TABS
100.0000 mg | ORAL_TABLET | Freq: Once | ORAL | 11 refills | Status: AC | PRN
  Filled 2020-09-23: qty 9, 30d supply, fill #0

## 2020-09-23 MED ORDER — NORETHINDRONE 0.35 MG PO TABS
1.0000 | ORAL_TABLET | Freq: Every day | ORAL | 11 refills | Status: DC
  Filled 2020-09-23: qty 28, 28d supply, fill #0

## 2020-09-23 NOTE — Progress Notes (Signed)
55 y.o. G0P0000 Widowed White or Caucasian female here for new patient exam.  Has been on ortho Cyclen for years.  Takes continuous active for control of menstrual migraines.    Father passed 13 months before husband which was about two months ago.  She and husband were married for almost 20 years.  He had four children that were 2 - 16 when they were initially together.  She is now in process of adopting the youngest who is in his early 76's.  Very stressful time for her.  H/o "hormonal issues" including menstrual migraines.  Was put on continuous OCPs.  Uses migraine excedrin if has a migraine.  Hasn't needed more.  Remote hx of abnormal pap smears.  Had LEEP and "acid treatment".  Has been normal for several years.    No LMP recorded. (Menstrual status: Oral contraceptives).          Sexually active: No.  The current method of family planning is OCP (estrogen/progesterone).   Exercising: Yes.    water fit Smoker:  no  Health Maintenance: Pap:  2020 normal History of abnormal Pap:  yes MMG:  08/08/2019 Colonoscopy:  Release will be signed BMD:   n/a TDaP:  712/2016 Pneumonia vaccine(s):  n/a Shingrix:   Discussed  Hep C testing: 04/22/2020 Screening Labs: 04/22/2020   reports that she has never smoked. She has never used smokeless tobacco. She reports that she does not drink alcohol and does not use drugs.  Past Medical History:  Diagnosis Date  . Hypertension     Past Surgical History:  Procedure Laterality Date  . SHOULDER SURGERY      Current Outpatient Medications  Medication Sig Dispense Refill  . metoprolol succinate (TOPROL-XL) 50 MG 24 hr tablet TAKE 1 TABLET (50 MG TOTAL) BY MOUTH DAILY. TAKE WITH OR IMMEDIATELY FOLLOWING A MEAL. 90 tablet 3  . norgestimate-ethinyl estradiol (ORTHO-CYCLEN) 0.25-35 MG-MCG tablet Take 1 tablet by mouth daily skipping placebos. 112 tablet 4  . omeprazole (PRILOSEC) 20 MG capsule Take 1 capsule by mouth as needed.   3  . sertraline  (ZOLOFT) 25 MG tablet Take 1 tablet (25 mg total) by mouth daily. 90 tablet 1   No current facility-administered medications for this visit.    Family History  Adopted: Yes    Review of Systems  All other systems reviewed and are negative.   Exam:   BP (!) 170/102   Pulse 87   Ht 5' 4.5" (1.638 m)   Wt 232 lb (105.2 kg)   BMI 39.21 kg/m   Height: 5' 4.5" (163.8 cm)  Recheck BP after exam complete:  158/98  General appearance: alert, cooperative and appears stated age Head: Normocephalic, without obvious abnormality, atraumatic Neck: no adenopathy, supple, symmetrical, trachea midline and thyroid normal to inspection and palpation Lungs: clear to auscultation bilaterally Breasts: normal appearance, no masses or tenderness Heart: regular rate and rhythm Abdomen: soft, non-tender; bowel sounds normal; no masses,  no organomegaly Extremities: extremities normal, atraumatic, no cyanosis or edema Skin: Skin color, texture, turgor normal. No rashes or lesions Lymph nodes: Cervical, supraclavicular, and axillary nodes normal. No abnormal inguinal nodes palpated Neurologic: Grossly normal   Pelvic: External genitalia:  no lesions              Urethra:  normal appearing urethra with no masses, tenderness or lesions              Bartholins and Skenes: normal  Vagina: normal appearing vagina with normal color and discharge, no lesions              Cervix: no lesions              Pap taken: Yes.   Bimanual Exam:  Uterus:  normal size, contour, position, consistency, mobility, non-tender              Adnexa: normal adnexa and no mass, fullness, tenderness               Rectovaginal: Confirms               Anus:  normal sphincter tone, no lesions  Chaperone, Francene Finders, RN, was present for exam.  Assessment/Plan: 1. Well woman exam with routine gynecological exam - pap and HR HPV obtained today - MMG needs to be scheduled.  Order placed. - Colonoscopy release  signed to hopefully get copy - lab work done with Dr. [place] 04/2020 - vaccines updated.  Pt needs to get me dates of her Covid vaccinations.  She did complete it with the booster.  2. Amenorrhea - Anti mullerian hormone; Future  3. H/O abnormal cervical Papanicolaou smear  4. High blood triglycerides  5. Essential hypertension - pt advised to stop combination OCPs today due to elevated blood pressure and risks of stroke - recommended starting 25mg  HCTZ and plan BP recheck with me or with Dr. .  Pt declines starting BP medication today.  Wants to see if stopping OCPs helps.  Feel strongly she is going to need hypertension management.  Will have pt check BP daily this week.  If stays elevated, she will need treatment.  Concerns about stroke discussed with pt.  She voices understanding.  6. Psychosocial stressors - on low dosed Zoloft.  Does not need feel needs any dosage adjustment.  7.  Menstrual migraines - will transition to progesterone only pill.  Rx to pharmacy.  Pt will start today.  If in menopause (checking AMH) may be able to stop all hormonal therapy.  - also rx for imitrex given in case has migraine with combination OCP cessation.

## 2020-09-24 ENCOUNTER — Encounter (HOSPITAL_BASED_OUTPATIENT_CLINIC_OR_DEPARTMENT_OTHER): Payer: Self-pay | Admitting: Obstetrics & Gynecology

## 2020-09-24 ENCOUNTER — Other Ambulatory Visit (HOSPITAL_BASED_OUTPATIENT_CLINIC_OR_DEPARTMENT_OTHER)
Admission: RE | Admit: 2020-09-24 | Discharge: 2020-09-24 | Disposition: A | Discharge status: Home / Self Care | Payer: No Typology Code available for payment source | Source: Ambulatory Visit | Attending: Obstetrics & Gynecology | Admitting: Obstetrics & Gynecology

## 2020-09-24 ENCOUNTER — Other Ambulatory Visit (HOSPITAL_BASED_OUTPATIENT_CLINIC_OR_DEPARTMENT_OTHER): Payer: Self-pay

## 2020-09-24 ENCOUNTER — Other Ambulatory Visit (HOSPITAL_BASED_OUTPATIENT_CLINIC_OR_DEPARTMENT_OTHER): Payer: Self-pay | Admitting: Obstetrics & Gynecology

## 2020-09-24 ENCOUNTER — Ambulatory Visit (INDEPENDENT_AMBULATORY_CARE_PROVIDER_SITE_OTHER): Payer: No Typology Code available for payment source | Admitting: Obstetrics & Gynecology

## 2020-09-24 VITALS — BP 155/95 | HR 70

## 2020-09-24 DIAGNOSIS — I10 Essential (primary) hypertension: Secondary | ICD-10-CM

## 2020-09-24 DIAGNOSIS — N912 Amenorrhea, unspecified: Secondary | ICD-10-CM | POA: Diagnosis not present

## 2020-09-24 MED ORDER — HYDROCHLOROTHIAZIDE 12.5 MG PO CAPS
12.5000 mg | ORAL_CAPSULE | Freq: Every day | ORAL | 1 refills | Status: DC
  Filled 2020-09-24: qty 30, 30d supply, fill #0

## 2020-09-24 NOTE — Progress Notes (Signed)
BP Readings from Last 3 Encounters:  09/24/20 (!) 158/98  04/22/20 138/78  10/24/19 140/80    Discussed with pt current BP.  No symptoms.  Stopped combination OCP last night.  Will start micronor today.  Feel she really does need to start BP management.  She is concerned dosage will be too high.  Will start HCTZ 12.5mg  daily.  #30/1RF to pharmacy on file.  Recheck BP on Friday.

## 2020-09-25 LAB — CYTOLOGY - PAP
Comment: NEGATIVE
Diagnosis: NEGATIVE
Diagnosis: REACTIVE
High risk HPV: NEGATIVE

## 2020-09-27 ENCOUNTER — Ambulatory Visit (HOSPITAL_BASED_OUTPATIENT_CLINIC_OR_DEPARTMENT_OTHER): Payer: Self-pay

## 2020-09-27 ENCOUNTER — Telehealth: Payer: Self-pay

## 2020-09-27 NOTE — Telephone Encounter (Signed)
Amanda Mack is a 55 y.o. female has been contacted and was notified the message below. Pt said she is not having any sx. She said if you feel she needs medication then she will pick it up. Pt also said she is waiting for blood work results.

## 2020-09-27 NOTE — Telephone Encounter (Signed)
-----   Message from Jerene Bears, MD sent at 09/26/2020  6:47 AM EDT ----- Please let pt know her pap was normal and HR HPV was negative.  She did have some yeast on the pap smear.  If having any symptoms, ok to treat with Diflucan 150mg  po x 1 or OTC monistat 3.  I can send in rx to pharmacy if she desires.  Thanks.

## 2020-09-30 LAB — ANTI MULLERIAN HORMONE: ANTI-MULLERIAN HORMONE (AMH): <0.015 ng/mL

## 2020-10-01 ENCOUNTER — Other Ambulatory Visit (HOSPITAL_BASED_OUTPATIENT_CLINIC_OR_DEPARTMENT_OTHER): Payer: Self-pay | Admitting: Obstetrics & Gynecology

## 2020-10-01 ENCOUNTER — Ambulatory Visit (HOSPITAL_BASED_OUTPATIENT_CLINIC_OR_DEPARTMENT_OTHER)
Admission: RE | Admit: 2020-10-01 | Discharge: 2020-10-01 | Disposition: A | Discharge status: Home / Self Care | Payer: No Typology Code available for payment source | Source: Ambulatory Visit | Attending: Obstetrics & Gynecology | Admitting: Obstetrics & Gynecology

## 2020-10-01 ENCOUNTER — Other Ambulatory Visit: Payer: Self-pay

## 2020-10-01 DIAGNOSIS — Z1231 Encounter for screening mammogram for malignant neoplasm of breast: Secondary | ICD-10-CM

## 2020-10-02 ENCOUNTER — Encounter (HOSPITAL_BASED_OUTPATIENT_CLINIC_OR_DEPARTMENT_OTHER): Payer: Self-pay

## 2020-10-02 ENCOUNTER — Other Ambulatory Visit (HOSPITAL_BASED_OUTPATIENT_CLINIC_OR_DEPARTMENT_OTHER): Payer: Self-pay

## 2020-10-02 ENCOUNTER — Ambulatory Visit (HOSPITAL_BASED_OUTPATIENT_CLINIC_OR_DEPARTMENT_OTHER): Payer: No Typology Code available for payment source | Admitting: Obstetrics & Gynecology

## 2020-10-02 ENCOUNTER — Other Ambulatory Visit (HOSPITAL_BASED_OUTPATIENT_CLINIC_OR_DEPARTMENT_OTHER): Payer: Self-pay | Admitting: Obstetrics & Gynecology

## 2020-10-02 MED ORDER — HYDROCHLOROTHIAZIDE 25 MG PO TABS
25.0000 mg | ORAL_TABLET | Freq: Every day | ORAL | 3 refills | Status: DC
  Filled 2020-10-02: qty 30, 30d supply, fill #0
  Filled 2020-11-11: qty 90, 90d supply, fill #1

## 2020-10-09 ENCOUNTER — Encounter (HOSPITAL_BASED_OUTPATIENT_CLINIC_OR_DEPARTMENT_OTHER): Payer: Self-pay

## 2020-10-21 ENCOUNTER — Other Ambulatory Visit: Payer: Self-pay

## 2020-10-21 ENCOUNTER — Ambulatory Visit (INDEPENDENT_AMBULATORY_CARE_PROVIDER_SITE_OTHER): Payer: No Typology Code available for payment source | Admitting: Family Medicine

## 2020-10-21 ENCOUNTER — Encounter: Payer: Self-pay | Admitting: Family Medicine

## 2020-10-21 VITALS — BP 130/80 | HR 96 | Resp 16 | Ht 64.5 in | Wt 230.0 lb

## 2020-10-21 DIAGNOSIS — Z6838 Body mass index (BMI) 38.0-38.9, adult: Secondary | ICD-10-CM | POA: Diagnosis not present

## 2020-10-21 DIAGNOSIS — I10 Essential (primary) hypertension: Secondary | ICD-10-CM | POA: Diagnosis not present

## 2020-10-21 DIAGNOSIS — R000 Tachycardia, unspecified: Secondary | ICD-10-CM

## 2020-10-21 NOTE — Patient Instructions (Addendum)
A few things to remember from today's visit:   Essential hypertension - Plan: Basic metabolic panel  Sinus tachycardia - Plan: EKG 12-Lead  If you need refills please call your pharmacy. Do not use My Chart to request refills or for acute issues that need immediate attention.   EKG was normal. Continue monitoring heart rate and blood pressure. If skipping beats/irregular heart rate becomes more frequent, we may need a cardiac monitor, in which case we will need cardiologist evaluation.  Please be sure medication list is accurate. If a new problem present, please set up appointment sooner than planned today.

## 2020-10-21 NOTE — Progress Notes (Signed)
Chief Complaint  Patient presents with  . Blood Pressure Check   HPI: [name] is a 55 y.o. female, who is here today complaining of elevated BP. She was recently at [facility] office, BP was elevated at 173/103.  Oral hormonal therapy was discontinued and HCTZ 25 mg added. She is also on Metoprolol succinate 50 mg daily, which was also prescribed for sinus tachycardia. Still has occasional palpitations, not as bad as they were years ago. She has had problem for 20+ years. Around age 38 she had negative cardiac work up Palpitations do not happen with exertion. HR's 80's. She has BP check at work and has improved, 140's/80's.  She has not identified exacerbating or alleviating factors.  Negative for severe/frequent headache, visual changes, chest pain, dyspnea,focal weakness, or edema.  Component     Latest Ref Rng & Units 04/22/2020  Glucose     70 - 99 mg/dL 96  BUN     6 - 23 mg/dL 14  Creatinine     4.01 - 1.20 mg/dL 0.27  GFR, Est Non African American     > OR = 60 mL/min/1.58m2 93  GFR, Est African American     > OR = 60 mL/min/1.75m2 108  BUN/Creatinine Ratio     6 - 22 (calc) NOT APPLICABLE  Sodium     135 - 145 mEq/L 138  Potassium     3.5 - 5.1 mEq/L 4.3  Chloride     96 - 112 mEq/L 107  CO2     19 - 32 mEq/L 24  Calcium     8.4 - 10.5 mg/dL 8.9   Since her last visit,her husband died. Resumed Sertraline 25 mg and took it for about 3 months,weaned it off.  She is dealing better with anxiety and stress. She lost some wt. She is trying to eat healthier, smaller portions. She is also exercising regularly, Engineer, agricultural.  Review of Systems  Constitutional: Negative for activity change, appetite change, fatigue and fever.  HENT: Negative for mouth sores, nosebleeds and sore throat.   Respiratory: Negative for cough and wheezing.   Gastrointestinal: Negative for abdominal pain, nausea and vomiting.       Negative for changes in  bowel habits.  Genitourinary: Negative for decreased urine volume and hematuria.  Musculoskeletal: Negative for gait problem and myalgias.  Neurological: Negative for syncope, facial asymmetry and weakness.  Rest see pertinent positives and negatives per HPI.  Current Outpatient Medications on File Prior to Visit  Medication Sig Dispense Refill  . hydrochlorothiazide (HYDRODIURIL) 25 MG tablet Take 1 tablet (25 mg total) by mouth daily. 30 tablet 3  . metoprolol succinate (TOPROL-XL) 50 MG 24 hr tablet TAKE 1 TABLET (50 MG TOTAL) BY MOUTH DAILY. TAKE WITH OR IMMEDIATELY FOLLOWING A MEAL. 90 tablet 3  . omeprazole (PRILOSEC) 20 MG capsule Take 1 capsule by mouth as needed.   3  . sertraline (ZOLOFT) 25 MG tablet Take 1 tablet (25 mg total) by mouth daily. 90 tablet 1  . SUMAtriptan (IMITREX) 100 MG tablet Take 1 tablet (100 mg total) by mouth once as needed for up to 1 dose for migraine. May repeat in 2 hours if headache persists or recurs. 9 tablet 11   No current facility-administered medications on file prior to visit.     Past Medical History:  Diagnosis Date  . H/O LEEP 1990  . Hypertension   . Migraines    Allergies  Allergen Reactions  .  Nickel Rash  . Strawberry Extract Anaphylaxis and Rash    Social History   Socioeconomic History  . Marital status: Widowed    Spouse name: Not on file  . Number of children: Not on file  . Years of education: Not on file  . Highest education level: Not on file  Occupational History  . Not on file  Tobacco Use  . Smoking status: Never Smoker  . Smokeless tobacco: Never Used  Vaping Use  . Vaping Use: Never used  Substance and Sexual Activity  . Alcohol use: No  . Drug use: No  . Sexual activity: Not Currently    Birth control/protection: Pill  Other Topics Concern  . Not on file  Social History Narrative  . Not on file   Social Determinants of Health   Financial Resource Strain: Not on file  Food Insecurity: Not on file   Transportation Needs: Not on file  Physical Activity: Not on file  Stress: Not on file  Social Connections: Not on file   Vitals:   10/21/20 1507 10/21/20 1552  BP: 130/80   Pulse: (!) 106 96  Resp: 16   SpO2: 96%    Body mass index is 38.87 kg/m.  Physical Exam Vitals and nursing note reviewed.  Constitutional:      General: She is not in acute distress.    Appearance: She is well-developed.  HENT:     Head: Normocephalic and atraumatic.     Mouth/Throat:     Mouth: Mucous membranes are moist.     Pharynx: Oropharynx is clear.  Eyes:     Conjunctiva/sclera: Conjunctivae normal.     Pupils: Pupils are equal, round, and reactive to light.  Cardiovascular:     Rate and Rhythm: Normal rate and regular rhythm.     Pulses:          Dorsalis pedis pulses are 2+ on the right side and 2+ on the left side.     Heart sounds: No murmur heard.   Pulmonary:     Effort: Pulmonary effort is normal. No respiratory distress.     Breath sounds: Normal breath sounds.  Abdominal:     Palpations: Abdomen is soft. There is no hepatomegaly or mass.     Tenderness: There is no abdominal tenderness.  Lymphadenopathy:     Cervical: No cervical adenopathy.  Skin:    General: Skin is warm.     Findings: No erythema or rash.  Neurological:     General: No focal deficit present.     Mental Status: She is alert and oriented to person, place, and time.     Cranial Nerves: No cranial nerve deficit.     Gait: Gait normal.  Psychiatric:     Comments: Well groomed, good eye contact.   ASSESSMENT AND PLAN:  [name] was seen today for blood pressure check.  Diagnoses and all orders for this visit: Orders Placed This Encounter  Procedures  . Basic metabolic panel  . EKG 12-Lead   Lab Results  Component Value Date   CREATININE 1.03 10/21/2020   BUN 19 10/21/2020   NA 140 10/21/2020   K 4.1 10/21/2020   CL 104 10/21/2020   CO2 26 10/21/2020   Essential hypertension BP has  improved. Goal BP <=130/80. We discussed possible complications of elevated BP. Continue HCTZ 25 mg daily and Metoprolol Succinate 50 mg daily. Continue monitoring BP regularly.  Sinus tachycardia Problem seems to be well controlled. Hx and examination today  do not suggest a serious process. EKG today:SR,normal axis and intervals. Compared with EKG in 03/2016 S wave in DIII and AVF deeper but otherwise no significant changes. She has had negative cardiac work up years ago, I do not have copy od results. For now I think it is appropriate to continue BB, if problem gets worse, we may need a cardiac monitor,in which case cardio evaluation will be considered.  Class 2 severe obesity with serious comorbidity and body mass index (BMI) of 38.0 to 38.9 in adult, unspecified obesity type [facility]) She has lost about 21 Lb since her last visit. Encouraged to continue with a healthful diet and regular physical activity.   Return in about 2 months (around 12/21/2020) for HTN.   Zarahi Fuerst G. [place], MD  [facility]. [place] office.  A few things to remember from today's visit:   Essential hypertension - Plan: Basic metabolic panel  Sinus tachycardia - Plan: EKG 12-Lead  If you need refills please call your pharmacy. Do not use My Chart to request refills or for acute issues that need immediate attention.   EKG was normal. Continue monitoring heart rate and blood pressure. If skipping beats/irregular heart rate becomes more frequent, we may need a cardiac monitor, in which case we will need cardiologist evaluation.  Please be sure medication list is accurate. If a new problem present, please set up appointment sooner than planned today.

## 2020-10-22 LAB — BASIC METABOLIC PANEL
BUN: 19 mg/dL (ref 6–23)
CO2: 26 mEq/L (ref 19–32)
Calcium: 10.3 mg/dL (ref 8.4–10.5)
Chloride: 104 mEq/L (ref 96–112)
Creatinine, Ser: 1.03 mg/dL (ref 0.40–1.20)
GFR: 62.44 mL/min (ref >60.00)
Glucose, Bld: 87 mg/dL (ref 70–99)
Potassium: 4.1 mEq/L (ref 3.5–5.1)
Sodium: 140 mEq/L (ref 135–145)

## 2020-11-11 ENCOUNTER — Other Ambulatory Visit (HOSPITAL_BASED_OUTPATIENT_CLINIC_OR_DEPARTMENT_OTHER): Payer: Self-pay

## 2020-11-11 MED FILL — Metoprolol Succinate Tab ER 24HR 50 MG (Tartrate Equiv): ORAL | 90 days supply | Qty: 90 | Fill #1 | Status: AC

## 2020-11-13 ENCOUNTER — Other Ambulatory Visit (HOSPITAL_BASED_OUTPATIENT_CLINIC_OR_DEPARTMENT_OTHER): Payer: Self-pay

## 2020-12-09 ENCOUNTER — Other Ambulatory Visit: Payer: Self-pay

## 2020-12-10 ENCOUNTER — Other Ambulatory Visit (HOSPITAL_BASED_OUTPATIENT_CLINIC_OR_DEPARTMENT_OTHER): Payer: Self-pay

## 2020-12-10 ENCOUNTER — Encounter: Payer: Self-pay | Admitting: Family Medicine

## 2020-12-10 ENCOUNTER — Ambulatory Visit (INDEPENDENT_AMBULATORY_CARE_PROVIDER_SITE_OTHER): Payer: No Typology Code available for payment source | Admitting: Family Medicine

## 2020-12-10 VITALS — BP 130/80 | HR 90 | Temp 98.6°F | Resp 16 | Ht 64.5 in | Wt 231.4 lb

## 2020-12-10 DIAGNOSIS — B029 Zoster without complications: Secondary | ICD-10-CM

## 2020-12-10 DIAGNOSIS — I10 Essential (primary) hypertension: Secondary | ICD-10-CM | POA: Diagnosis not present

## 2020-12-10 MED ORDER — VALACYCLOVIR HCL 1 G PO TABS
1000.0000 mg | ORAL_TABLET | Freq: Three times a day (TID) | ORAL | 0 refills | Status: AC
  Filled 2020-12-10: qty 21, 7d supply, fill #0

## 2020-12-10 MED ORDER — LIDOCAINE 3 % EX CREA
1.0000 "application " | TOPICAL_CREAM | Freq: Three times a day (TID) | CUTANEOUS | 0 refills | Status: DC | PRN
  Filled 2020-12-10: qty 85, fill #0

## 2020-12-10 MED ORDER — TRAMADOL HCL 50 MG PO TABS
50.0000 mg | ORAL_TABLET | Freq: Two times a day (BID) | ORAL | 0 refills | Status: AC | PRN
  Filled 2020-12-10: qty 14, 7d supply, fill #0

## 2020-12-10 NOTE — Progress Notes (Signed)
Chief Complaint  Patient presents with   possible shingles   HPI: Amanda Mack is a 55 y.o. female, who is here today complaining of very painful rash on upper right-sided back. She initially noted pain with no rash on 12/06/20, next day she noted erythematous papular rash lateral to right breast and next day on back.She has noted new lesions within the past 24 hours. Pain is constant, severe, exacerbated by light touch. No alleviating factors identified. It is interfering with sleep.  Negative for new medication, detergent, soap, or body product. No known insect bite or outdoor exposures to plants. No sick contact. No Hx of eczema or similar rash in the past.  OTC medication for this problem: Aleve at bedtime.   Negative for fever,chills, oral lesions/edema,cough, wheezing, dyspnea, abdominal pain, changes in bowel habits,nausea, or vomiting.  BP initially elevated. She is checking BP at home, 130's/80's. She is on Metoprolol succinate 50 mg daily and HCTZ 25 mg daily. Negative for headache,CP,or edema.  Lab Results  Component Value Date   CREATININE 1.03 10/21/2020   BUN 19 10/21/2020   NA 140 10/21/2020   K 4.1 10/21/2020   CL 104 10/21/2020   CO2 26 10/21/2020   Review of Systems  Constitutional:  Positive for fatigue. Negative for appetite change, chills and fever.  HENT:  Negative for congestion, ear pain, mouth sores, sneezing, sore throat, trouble swallowing and voice change.   Eyes:  Negative for discharge and redness.  Gastrointestinal:  Negative for vomiting.  Musculoskeletal:  Positive for back pain. Negative for gait problem.  Skin:  Positive for rash.  Allergic/Immunologic: Negative for environmental allergies.  Neurological:  Negative for syncope, facial asymmetry, weakness and numbness.  Hematological:  Negative for adenopathy. Does not bruise/bleed easily.  Rest see pertinent positives and negatives per HPI.  Current Outpatient  Medications on File Prior to Visit  Medication Sig Dispense Refill   hydrochlorothiazide (HYDRODIURIL) 25 MG tablet Take 1 tablet (25 mg total) by mouth daily. 30 tablet 3   metoprolol succinate (TOPROL-XL) 50 MG 24 hr tablet TAKE 1 TABLET (50 MG TOTAL) BY MOUTH DAILY. TAKE WITH OR IMMEDIATELY FOLLOWING A MEAL. 90 tablet 3   omeprazole (PRILOSEC) 20 MG capsule Take 1 capsule by mouth as needed.   3   sertraline (ZOLOFT) 25 MG tablet Take 1 tablet (25 mg total) by mouth daily. 90 tablet 1   SUMAtriptan (IMITREX) 100 MG tablet Take 1 tablet (100 mg total) by mouth once as needed for up to 1 dose for migraine. May repeat in 2 hours if headache persists or recurs. 9 tablet 11   No current facility-administered medications on file prior to visit.   Past Medical History:  Diagnosis Date   H/O LEEP 1990   Hypertension    Migraines    Allergies  Allergen Reactions   Nickel Rash   Strawberry Extract Anaphylaxis and Rash    Social History   Socioeconomic History   Marital status: Widowed    Spouse name: Not on file   Number of children: Not on file   Years of education: Not on file   Highest education level: Not on file  Occupational History   Not on file  Tobacco Use   Smoking status: Never   Smokeless tobacco: Never  Vaping Use   Vaping Use: Never used  Substance and Sexual Activity   Alcohol use: No   Drug use: No   Sexual activity: Not Currently  Birth control/protection: Pill  Other Topics Concern   Not on file  Social History Narrative   Not on file   Social Determinants of Health   Financial Resource Strain: Not on file  Food Insecurity: Not on file  Transportation Needs: Not on file  Physical Activity: Not on file  Stress: Not on file  Social Connections: Not on file    Vitals:   12/10/20 1526 12/10/20 1600  BP: (!) 160/90 130/80  Pulse: 90   Resp: 16   Temp: 98.6 F (37 C)   SpO2: 96%    Body mass index is 39.11 kg/m.  Physical Exam Vitals and  nursing note reviewed.  Constitutional:      General: She is not in acute distress.    Appearance: She is well-developed.  HENT:     Head: Normocephalic and atraumatic.  Eyes:     Conjunctiva/sclera: Conjunctivae normal.  Cardiovascular:     Rate and Rhythm: Normal rate and regular rhythm.     Heart sounds: No murmur heard. Pulmonary:     Effort: Pulmonary effort is normal. No respiratory distress.     Breath sounds: Normal breath sounds.  Lymphadenopathy:     Cervical: No cervical adenopathy.  Skin:    General: Skin is warm.     Findings: Erythema and rash present.          Comments: On right upper back and lateral aspect pf right breast with confluent vesicular,erythematous lesions. Tender upon light touch  See picture.   Neurological:     General: No focal deficit present.     Mental Status: She is alert and oriented to person, place, and time.     Gait: Gait normal.  Psychiatric:        Speech: Speech normal.     Comments: Well groomed, good eye contact.    ASSESSMENT AND PLAN:  [name] was seen today for possible shingles.  Diagnoses and all orders for this visit:  Herpes zoster without complication We discussed Dx,prognosis,and treatment options. Valtrex to start today. Topical Lidocaine and oral Tramadol 50 mg at bedtime. Some side effects of Tramadol discussed. Letter for work given.  -     valACYclovir (VALTREX) 1000 MG tablet; Take 1 tablet (1,000 mg total) by mouth 3 (three) times daily for 7 days. -     Lidocaine 3 % CREA; Apply 1 application topically 3 (three) times daily as needed. -     traMADol (ULTRAM) 50 MG tablet; Take 1 tablet (50 mg total) by mouth every 12 (twelve) hours as needed for up to 7 days.  Essential hypertension BP re-checked and improved. Continue Metoprolol succinate 50 mg daily and HCTZ 25 mg daily + low salt diet. Continue monitoring BP regularly.  Return if symptoms worsen or fail to improve, for Keep next appt.   Doris Gruhn  G. [place], MD  [facility]. [place] office.    A few things to remember from today's visit:   Herpes zoster without complication - Plan: valACYclovir (VALTREX) 1000 MG tablet, Lidocaine 3 % CREA, traMADol (ULTRAM) 50 MG tablet  Essential hypertension  If you need refills please call your pharmacy. Do not use My Chart to request refills or for acute issues that need immediate attention. Tramadol and Lidocaine for pain.   Shingles  Shingles is an infection. It gives you a painful skin rash and blisters that have fluid in them. Shingles is caused by the same germ (virus) that causes chickenpox. Shingles only happens in  people who: Have had chickenpox. Have been given a shot (vaccine) to protect against chickenpox. Shingles is rare in this group. What are the causes? This condition is caused by varicella-zoster virus. This is the same germ that causes chickenpox. After a person is exposed to the germ, the germ stays in the body but is not active (dormant). Shingles develops if the germ becomes active again (is reactivated). This can happen many years after the first exposure to the germ. It is notknown what causes this germ to become active again. What increases the risk? People who have had chickenpox or received the chickenpox shot are at risk for shingles. This infection is more common in people who: Are older than 53 years of age. Have a weakened disease-fighting system (immune system), such as people with: HIV (human immunodeficiency virus). AIDS (acquired immunodeficiency syndrome). Cancer. Are taking medicines that weaken the immune system, such as organ transplant medicines. Have a lot of stress. What are the signs or symptoms? The first symptoms of shingles may be itching, tingling, or pain in an area onyour skin. A rash will show on your skin a few days or weeks later. This is what usually happens: The rash is likely to be on one side of your body. The rash  usually has a shape like a belt or a band. Over time, the rash turns into fluid-filled blisters. The blisters will break open and change into scabs. The scabs usually dry up in about 2-3 weeks. You may also have: A fever. Chills. A headache. A feeling like you may vomit (nausea). How is this treated? The rash may last for several weeks. There is not a specific cure for thiscondition. Your doctor may prescribe medicines. Medicines may: Help with pain. Help you get better sooner. Help to prevent long-term problems. Help with itching (antihistamines). If the area involved is on your face, you may need to see a specialist. Thismay be an eye doctor or an ear, nose, and throat (ENT) doctor. Follow these instructions at home: Medicines Take over-the-counter and prescription medicines only as told by your doctor. Put on an anti-itch cream or numbing cream where you have a rash, blisters, or scabs. Do this as told by your doctor. Helping with itching and discomfort  Put cold, wet cloths (cold compresses) on the area of the rash or blisters as told by your doctor. Cool baths can help you feel better. Try adding baking soda or dry oatmeal to the water to lessen itching. Do not bathe in hot water. Use calamine lotion as told by your doctor.  Blister and rash care Keep your rash covered with a loose bandage (dressing). Wear loose clothing that does not rub on your rash. Wash your hands with soap and water for at least 20 seconds before and after you change your bandage. If you cannot use soap and water, use hand sanitizer. Change your bandage as told by your doctor. Keep your rash and blisters clean. To do this, wash the area with mild soap and cool water as told by your doctor. Check your rash every day for signs of infection. Check for: More redness, swelling, or pain. Fluid or blood. Warmth. Pus or a bad smell. Do not scratch your rash. Do not pick at your blisters. To help you to not  scratch: Keep your fingernails clean and cut short. Wear gloves or mittens when you sleep, if scratching is a problem. General instructions Rest as told by your doctor. Wash your hands often with  soap and water for at least 20 seconds. If you cannot use soap and water, use hand sanitizer. Doing this lowers your chance of getting a skin infection. Your infection can cause chickenpox in people who have never had chickenpox or never got a chickenpox vaccine shot. If you have blisters that did not change into scabs yet, try not to touch other people or be around other people, especially: Babies. Pregnant women. Children who have areas of red, itchy, or rough skin (eczema). Older people who have organ transplants. People who have a long-term (chronic) illness, like cancer or AIDS. Keep all follow-up visits. How is this prevented? A vaccine shot is the best way to prevent shingles and protect against shinglesproblems. If you have not had a vaccine shot, talk with your doctor about getting it. Where to find more information Centers for Disease Control and Prevention: [employer] Contact a doctor if: Your pain does not get better with medicine. Your pain does not get better after the rash heals. You have any of these signs of infection around the rash: More redness, swelling, or pain. Fluid or blood. Warmth. Pus or a bad smell. You have a fever. Get help right away if: The rash is on your face or nose. You have pain in your face or pain by your eye. You lose feeling on one side of your face. You have trouble seeing. You have ear pain, or you have ringing in your ear. You have a loss of taste. Your condition gets worse. Summary Shingles gives you a painful skin rash and blisters that have fluid in them. Shingles is caused by the same germ (virus) that causes chickenpox. Keep your rash covered with a loose bandage. Wear loose clothing that does not rub on your rash. If you have blisters  that did not change into scabs yet, try not to touch other people or be around people. This information is not intended to replace advice given to you by your health care provider. Make sure you discuss any questions you have with your healthcare provider. Document Revised: 06/03/2020 Document Reviewed: 06/03/2020 Elsevier Patient Education  2022 [employer]   Please be sure medication list is accurate. If a new problem present, please set up appointment sooner than planned today.

## 2020-12-10 NOTE — Patient Instructions (Addendum)
A few things to remember from today's visit:   Herpes zoster without complication - Plan: valACYclovir (VALTREX) 1000 MG tablet, Lidocaine 3 % CREA, traMADol (ULTRAM) 50 MG tablet  Essential hypertension  If you need refills please call your pharmacy. Do not use My Chart to request refills or for acute issues that need immediate attention. Tramadol and Lidocaine for pain.   Shingles  Shingles is an infection. It gives you a painful skin rash and blisters that have fluid in them. Shingles is caused by the same germ (virus) that causes chickenpox. Shingles only happens in people who: Have had chickenpox. Have been given a shot (vaccine) to protect against chickenpox. Shingles is rare in this group. What are the causes? This condition is caused by varicella-zoster virus. This is the same germ that causes chickenpox. After a person is exposed to the germ, the germ stays in the body but is not active (dormant). Shingles develops if the germ becomes active again (is reactivated). This can happen many years after the first exposure to the germ. It is notknown what causes this germ to become active again. What increases the risk? People who have had chickenpox or received the chickenpox shot are at risk for shingles. This infection is more common in people who: Are older than 53 years of age. Have a weakened disease-fighting system (immune system), such as people with: HIV (human immunodeficiency virus). AIDS (acquired immunodeficiency syndrome). Cancer. Are taking medicines that weaken the immune system, such as organ transplant medicines. Have a lot of stress. What are the signs or symptoms? The first symptoms of shingles may be itching, tingling, or pain in an area onyour skin. A rash will show on your skin a few days or weeks later. This is what usually happens: The rash is likely to be on one side of your body. The rash usually has a shape like a belt or a band. Over time, the rash  turns into fluid-filled blisters. The blisters will break open and change into scabs. The scabs usually dry up in about 2-3 weeks. You may also have: A fever. Chills. A headache. A feeling like you may vomit (nausea). How is this treated? The rash may last for several weeks. There is not a specific cure for thiscondition. Your doctor may prescribe medicines. Medicines may: Help with pain. Help you get better sooner. Help to prevent long-term problems. Help with itching (antihistamines). If the area involved is on your face, you may need to see a specialist. Thismay be an eye doctor or an ear, nose, and throat (ENT) doctor. Follow these instructions at home: Medicines Take over-the-counter and prescription medicines only as told by your doctor. Put on an anti-itch cream or numbing cream where you have a rash, blisters, or scabs. Do this as told by your doctor. Helping with itching and discomfort  Put cold, wet cloths (cold compresses) on the area of the rash or blisters as told by your doctor. Cool baths can help you feel better. Try adding baking soda or dry oatmeal to the water to lessen itching. Do not bathe in hot water. Use calamine lotion as told by your doctor.  Blister and rash care Keep your rash covered with a loose bandage (dressing). Wear loose clothing that does not rub on your rash. Wash your hands with soap and water for at least 20 seconds before and after you change your bandage. If you cannot use soap and water, use hand sanitizer. Change your bandage as told by  your doctor. Keep your rash and blisters clean. To do this, wash the area with mild soap and cool water as told by your doctor. Check your rash every day for signs of infection. Check for: More redness, swelling, or pain. Fluid or blood. Warmth. Pus or a bad smell. Do not scratch your rash. Do not pick at your blisters. To help you to not scratch: Keep your fingernails clean and cut short. Wear gloves  or mittens when you sleep, if scratching is a problem. General instructions Rest as told by your doctor. Wash your hands often with soap and water for at least 20 seconds. If you cannot use soap and water, use hand sanitizer. Doing this lowers your chance of getting a skin infection. Your infection can cause chickenpox in people who have never had chickenpox or never got a chickenpox vaccine shot. If you have blisters that did not change into scabs yet, try not to touch other people or be around other people, especially: Babies. Pregnant women. Children who have areas of red, itchy, or rough skin (eczema). Older people who have organ transplants. People who have a long-term (chronic) illness, like cancer or AIDS. Keep all follow-up visits. How is this prevented? A vaccine shot is the best way to prevent shingles and protect against shinglesproblems. If you have not had a vaccine shot, talk with your doctor about getting it. Where to find more information Centers for Disease Control and Prevention: [employer] Contact a doctor if: Your pain does not get better with medicine. Your pain does not get better after the rash heals. You have any of these signs of infection around the rash: More redness, swelling, or pain. Fluid or blood. Warmth. Pus or a bad smell. You have a fever. Get help right away if: The rash is on your face or nose. You have pain in your face or pain by your eye. You lose feeling on one side of your face. You have trouble seeing. You have ear pain, or you have ringing in your ear. You have a loss of taste. Your condition gets worse. Summary Shingles gives you a painful skin rash and blisters that have fluid in them. Shingles is caused by the same germ (virus) that causes chickenpox. Keep your rash covered with a loose bandage. Wear loose clothing that does not rub on your rash. If you have blisters that did not change into scabs yet, try not to touch other people  or be around people. This information is not intended to replace advice given to you by your health care provider. Make sure you discuss any questions you have with your healthcare provider. Document Revised: 06/03/2020 Document Reviewed: 06/03/2020 Elsevier Patient Education  2022 Elsevier Inc.   Please be sure medication list is accurate. If a new problem present, please set up appointment sooner than planned today.

## 2020-12-24 ENCOUNTER — Other Ambulatory Visit: Payer: Self-pay

## 2020-12-24 ENCOUNTER — Ambulatory Visit (INDEPENDENT_AMBULATORY_CARE_PROVIDER_SITE_OTHER): Payer: No Typology Code available for payment source | Admitting: Family Medicine

## 2020-12-24 ENCOUNTER — Encounter: Payer: Self-pay | Admitting: Family Medicine

## 2020-12-24 VITALS — BP 128/82 | HR 74 | Resp 12 | Ht 64.5 in | Wt 231.0 lb

## 2020-12-24 DIAGNOSIS — I10 Essential (primary) hypertension: Secondary | ICD-10-CM | POA: Diagnosis not present

## 2020-12-24 DIAGNOSIS — B0229 Other postherpetic nervous system involvement: Secondary | ICD-10-CM

## 2020-12-24 DIAGNOSIS — R768 Other specified abnormal immunological findings in serum: Secondary | ICD-10-CM

## 2020-12-24 DIAGNOSIS — R233 Spontaneous ecchymoses: Secondary | ICD-10-CM

## 2020-12-24 LAB — PROTIME-INR
INR: 1.1 ratio — ABNORMAL HIGH (ref 0.8–1.0)
Prothrombin Time: 12.3 s (ref 9.6–13.1)

## 2020-12-24 LAB — C-REACTIVE PROTEIN: CRP: <1 mg/dL (ref 0.5–20.0)

## 2020-12-24 LAB — CBC WITH DIFFERENTIAL/PLATELET
Basophils Absolute: 0 10*3/uL (ref 0.0–0.1)
Basophils Relative: 0.5 % (ref 0.0–3.0)
Eosinophils Absolute: 0.2 10*3/uL (ref 0.0–0.7)
Eosinophils Relative: 3.4 % (ref 0.0–5.0)
HCT: 34.6 % — ABNORMAL LOW (ref 36.0–46.0)
Hemoglobin: 12.4 g/dL (ref 12.0–15.0)
Lymphocytes Relative: 38.8 % (ref 12.0–46.0)
Lymphs Abs: 2.5 10*3/uL (ref 0.7–4.0)
MCHC: 35.8 g/dL (ref 30.0–36.0)
MCV: 88.6 fl (ref 78.0–100.0)
Monocytes Absolute: 0.4 10*3/uL (ref 0.1–1.0)
Monocytes Relative: 6.6 % (ref 3.0–12.0)
Neutro Abs: 3.3 10*3/uL (ref 1.4–7.7)
Neutrophils Relative %: 50.7 % (ref 43.0–77.0)
Platelets: 273 10*3/uL (ref 150.0–400.0)
RBC: 3.9 Mil/uL (ref 3.87–5.11)
RDW: 13.2 % (ref 11.5–15.5)
WBC: 6.5 10*3/uL (ref 4.0–10.5)

## 2020-12-24 LAB — SEDIMENTATION RATE: Sed Rate: 7 mm/hr (ref 0–30)

## 2020-12-24 LAB — URINALYSIS, ROUTINE W REFLEX MICROSCOPIC
Bilirubin Urine: NEGATIVE
Hgb urine dipstick: NEGATIVE
Ketones, ur: NEGATIVE
Leukocytes,Ua: NEGATIVE
Nitrite: NEGATIVE
Specific Gravity, Urine: 1.02 (ref 1.000–1.030)
Total Protein, Urine: NEGATIVE
Urine Glucose: NEGATIVE
Urobilinogen, UA: 1 (ref 0.0–1.0)
pH: 7 (ref 5.0–8.0)

## 2020-12-24 LAB — TSH: TSH: 1.72 u[IU]/mL (ref 0.35–5.50)

## 2020-12-24 NOTE — Patient Instructions (Signed)
A few things to remember from today's visit:   Petechial rash - Plan: CBC with Differential/Platelet, TSH, ANA, C-reactive protein, Sedimentation rate, Protime-INR, Urinalysis, Routine w reflex microscopic  Postherpetic neuralgia  If you need refills please call your pharmacy. Do not use My Chart to request refills or for acute issues that need immediate attention.   Monitor for new symptoms. Further recommendations according to lab results. We will consider derma referral if problem is persistent.  Please be sure medication list is accurate. If a new problem present, please set up appointment sooner than planned today.

## 2020-12-24 NOTE — Progress Notes (Signed)
Chief Complaint  Patient presents with   Follow-up    hypertension   HPI: [name] is a 55 y.o. female, who is here today for 2 months follow up.   She was last seen on 12/10/20 for acute visit, zoster infection. She completed Valtrex treatment. Lesions are healing. Still having pain, sometimes severe, exacerbated by light touch and contact with clothes. She is on Lidocaine topical as needed. Negative for cough,wheezing,or SOB.  HTN and sinus tachycardia: She is on Metoprolol succinate 50 mg daily and HCTZ 25 mg daily. She has tolerated medication well. Negative for headache, visual changes, chest pain, dyspnea, local weakness, or edema. Occasional palpitations.  -Today she is very concerned about non pruritic or tender skin rash on LE's,feet,UE's,and upper back. 4 days ago she noted rash initially on dorsum of feet, sudden onset, has not noted new lesions.  Negative for new detergent, soap, or body product. No known insect bite or outdoor exposures to plants. No sick contact. No Hx of eczema or similar rash in the past.  OTC medication for this problem: Cortizone cream has not helped. Problem is stable.  Negative for fever,chills, oral lesions/edema,cough, wheezing, dyspnea, abdominal pain, changes in bowel movements,nausea, or vomiting. FHx negative for rheumatologic disorder.  She is very frustrated, has had some health issues + it has been a hard year, lost her father and husband.  Review of Systems  Constitutional:  Positive for fatigue. Negative for activity change, appetite change, fever and unexpected weight change.  HENT:  Negative for mouth sores, nosebleeds and sore throat.   Endocrine: Negative for cold intolerance and heat intolerance.  Genitourinary:  Negative for decreased urine volume, dysuria and hematuria.  Musculoskeletal:  Negative for gait problem and myalgias.  Neurological:  Negative for syncope, facial asymmetry and numbness.   Hematological:  Negative for adenopathy. Does not bruise/bleed easily.  Psychiatric/Behavioral:  Negative for confusion. The patient is nervous/anxious.   Rest of ROS, see pertinent positives sand negatives in HPI  Current Outpatient Medications on File Prior to Visit  Medication Sig Dispense Refill   hydrochlorothiazide (HYDRODIURIL) 25 MG tablet Take 1 tablet (25 mg total) by mouth daily. 30 tablet 3   metoprolol succinate (TOPROL-XL) 50 MG 24 hr tablet TAKE 1 TABLET (50 MG TOTAL) BY MOUTH DAILY. TAKE WITH OR IMMEDIATELY FOLLOWING A MEAL. 90 tablet 3   omeprazole (PRILOSEC) 20 MG capsule Take 1 capsule by mouth as needed.   3   sertraline (ZOLOFT) 25 MG tablet Take 1 tablet (25 mg total) by mouth daily. 90 tablet 1   SUMAtriptan (IMITREX) 100 MG tablet Take 1 tablet (100 mg total) by mouth once as needed for up to 1 dose for migraine. May repeat in 2 hours if headache persists or recurs. 9 tablet 11   No current facility-administered medications on file prior to visit.   Past Medical History:  Diagnosis Date   H/O LEEP 1990   Hypertension    Migraines    Allergies  Allergen Reactions   Nickel Rash   Strawberry Extract Anaphylaxis and Rash   Social History   Socioeconomic History   Marital status: Widowed    Spouse name: Not on file   Number of children: Not on file   Years of education: Not on file   Highest education level: Not on file  Occupational History   Not on file  Tobacco Use   Smoking status: Never   Smokeless tobacco: Never  Vaping Use  Vaping Use: Never used  Substance and Sexual Activity   Alcohol use: No   Drug use: No   Sexual activity: Not Currently    Birth control/protection: Pill  Other Topics Concern   Not on file  Social History Narrative   Not on file   Social Determinants of Health   Financial Resource Strain: Not on file  Food Insecurity: Not on file  Transportation Needs: Not on file  Physical Activity: Not on file  Stress: Not on  file  Social Connections: Not on file   Vitals:   12/24/20 1359  BP: 128/82  Pulse: 74  Resp: 12  SpO2: 97%   Body mass index is 39.04 kg/m.  Physical Exam Vitals and nursing note reviewed.  Constitutional:      General: She is not in acute distress.    Appearance: She is well-developed.  HENT:     Head: Normocephalic and atraumatic.     Mouth/Throat:     Mouth: Mucous membranes are moist.     Pharynx: Oropharynx is clear.  Eyes:     Conjunctiva/sclera: Conjunctivae normal.  Cardiovascular:     Rate and Rhythm: Normal rate and regular rhythm.     Pulses:          Dorsalis pedis pulses are 2+ on the right side and 2+ on the left side.     Heart sounds: No murmur heard. Pulmonary:     Effort: Pulmonary effort is normal. No respiratory distress.     Breath sounds: Normal breath sounds.  Chest:  Breasts:    Right: No axillary adenopathy or supraclavicular adenopathy.     Left: No axillary adenopathy or supraclavicular adenopathy.  Abdominal:     Palpations: Abdomen is soft. There is no hepatomegaly or mass.     Tenderness: There is no abdominal tenderness.  Lymphadenopathy:     Head:     Right side of head: No submandibular adenopathy.     Left side of head: No submandibular adenopathy.     Cervical: No cervical adenopathy.     Upper Body:     Right upper body: No supraclavicular or axillary adenopathy.     Left upper body: No supraclavicular or axillary adenopathy.  Skin:    General: Skin is warm.     Findings: No erythema or rash.     Comments: Crusty lesions following dermatomal distribution under right scapula and lateral thoracic area,T6-7. No erythematous rash appreciated. Petechial rash,confluent on lateral dorsal area of feet. Scatter petechia around ankles and forearms. I do not appreciate rash on upper back. It does not involve palms or plantar areas. No associated edema.  Neurological:     General: No focal deficit present.     Mental Status: She is  alert and oriented to person, place, and time.     Cranial Nerves: No cranial nerve deficit.     Gait: Gait normal.  Psychiatric:        Mood and Affect: Affect is labile.     Comments: Well groomed, good eye contact.   ASSESSMENT AND PLAN:  [name] was seen today for 2 months follow-up.  Orders Placed This Encounter  Procedures   CBC with Differential/Platelet   TSH   ANA   C-reactive protein   Sedimentation rate   Protime-INR   Urinalysis, Routine w reflex microscopic   Anti-nuclear ab-titer (ANA titer)   Lab Results  Component Value Date   TSH 1.72 12/24/2020   Lab Results  Component Value Date   WBC 6.5 12/24/2020   HGB 12.4 12/24/2020   HCT 34.6 (L) 12/24/2020   MCV 88.6 12/24/2020   PLT 273.0 12/24/2020   Lab Results  Component Value Date   ESRSEDRATE 7 12/24/2020   Lab Results  Component Value Date   CRP <1.0 12/24/2020   Lab Results  Component Value Date   INR 1.1 (H) 12/24/2020   Petechial rash We discussed possible etiologies. Problem has been stable. Hx and examination today do not suggest a serious process. Valtrex is the only new medication she took recently, sometimes it can cause thrombocytopenia. She is not on Aspirin or NSAID's. Clearly instructed about warning signs.  Postherpetic neuralgia Continue topical Lidocaine tid prn. We discussed prognosis. If not resolved in a couple weeks, we can consider Gabapentin.  Essential hypertension BP adequately controlled. Continue Metoprolol Succinate 50 mg and HCTZ 25 mg daily. Low salt diet. Monitor BP regularly.  Return if symptoms worsen or fail to improve, for Keep next appt.  Mikhia Dusek G. [place], MD  [facility]. [place] office.

## 2020-12-26 ENCOUNTER — Encounter: Payer: Self-pay | Admitting: Family Medicine

## 2020-12-26 LAB — ANA: Anti Nuclear Antibody (ANA): POSITIVE — AB

## 2020-12-26 LAB — ANTI-NUCLEAR AB-TITER (ANA TITER): ANA Titer 1: 1:80 {titer} — ABNORMAL HIGH

## 2020-12-27 ENCOUNTER — Other Ambulatory Visit: Payer: Self-pay

## 2020-12-27 DIAGNOSIS — R768 Other specified abnormal immunological findings in serum: Secondary | ICD-10-CM

## 2021-02-10 ENCOUNTER — Other Ambulatory Visit (HOSPITAL_BASED_OUTPATIENT_CLINIC_OR_DEPARTMENT_OTHER): Payer: Self-pay | Admitting: Family Medicine

## 2021-02-10 ENCOUNTER — Other Ambulatory Visit (HOSPITAL_BASED_OUTPATIENT_CLINIC_OR_DEPARTMENT_OTHER): Payer: Self-pay | Admitting: Obstetrics & Gynecology

## 2021-02-10 ENCOUNTER — Other Ambulatory Visit (HOSPITAL_BASED_OUTPATIENT_CLINIC_OR_DEPARTMENT_OTHER): Payer: Self-pay

## 2021-02-10 ENCOUNTER — Other Ambulatory Visit: Payer: Self-pay | Admitting: Family Medicine

## 2021-02-10 DIAGNOSIS — I10 Essential (primary) hypertension: Secondary | ICD-10-CM

## 2021-02-10 MED ORDER — METOPROLOL SUCCINATE ER 50 MG PO TB24
50.0000 mg | ORAL_TABLET | Freq: Every day | ORAL | 3 refills | Status: DC
  Filled 2021-02-10: qty 90, 90d supply, fill #0
  Filled 2021-05-30: qty 90, 90d supply, fill #1
  Filled 2021-08-29: qty 90, 90d supply, fill #2
  Filled 2021-11-25: qty 90, 90d supply, fill #3

## 2021-02-10 MED ORDER — HYDROCHLOROTHIAZIDE 25 MG PO TABS
25.0000 mg | ORAL_TABLET | Freq: Every day | ORAL | 3 refills | Status: DC
  Filled 2021-02-10: qty 90, 90d supply, fill #0

## 2021-02-11 ENCOUNTER — Other Ambulatory Visit (HOSPITAL_BASED_OUTPATIENT_CLINIC_OR_DEPARTMENT_OTHER): Payer: Self-pay

## 2021-03-10 ENCOUNTER — Encounter: Payer: Self-pay | Admitting: Family Medicine

## 2021-03-12 ENCOUNTER — Other Ambulatory Visit (HOSPITAL_BASED_OUTPATIENT_CLINIC_OR_DEPARTMENT_OTHER): Payer: Self-pay

## 2021-03-12 ENCOUNTER — Other Ambulatory Visit: Payer: Self-pay | Admitting: Family Medicine

## 2021-03-12 DIAGNOSIS — I10 Essential (primary) hypertension: Secondary | ICD-10-CM

## 2021-03-12 MED ORDER — LOSARTAN POTASSIUM 25 MG PO TABS
25.0000 mg | ORAL_TABLET | Freq: Every day | ORAL | 1 refills | Status: DC
  Filled 2021-03-12: qty 30, 30d supply, fill #0
  Filled 2021-04-10: qty 30, 30d supply, fill #1

## 2021-03-14 ENCOUNTER — Telehealth: Payer: Self-pay

## 2021-03-14 NOTE — Telephone Encounter (Signed)
Can you get her scheduled for the lab in 7 days and then a 4-6 week follow up? Thank you!!

## 2021-03-14 NOTE — Telephone Encounter (Signed)
-----   Message from Betty G [place], MD sent at 03/12/2021  1:46 PM EDT ----- Regarding: appt Needs lab appt 7-10 days after starting new med and 4-6 weeks f/u for HTN. Thanks, BJ

## 2021-03-21 ENCOUNTER — Other Ambulatory Visit: Payer: No Typology Code available for payment source

## 2021-03-24 ENCOUNTER — Other Ambulatory Visit: Payer: Self-pay

## 2021-03-25 ENCOUNTER — Other Ambulatory Visit (INDEPENDENT_AMBULATORY_CARE_PROVIDER_SITE_OTHER): Payer: No Typology Code available for payment source

## 2021-03-25 DIAGNOSIS — I10 Essential (primary) hypertension: Secondary | ICD-10-CM

## 2021-03-25 LAB — BASIC METABOLIC PANEL
BUN: 16 mg/dL (ref 6–23)
CO2: 26 mEq/L (ref 19–32)
Calcium: 9.3 mg/dL (ref 8.4–10.5)
Chloride: 107 mEq/L (ref 96–112)
Creatinine, Ser: 0.75 mg/dL (ref 0.40–1.20)
GFR: 91.09 mL/min (ref >60.00)
Glucose, Bld: 92 mg/dL (ref 70–99)
Potassium: 3.9 mEq/L (ref 3.5–5.1)
Sodium: 139 mEq/L (ref 135–145)

## 2021-04-04 ENCOUNTER — Other Ambulatory Visit (HOSPITAL_BASED_OUTPATIENT_CLINIC_OR_DEPARTMENT_OTHER): Payer: Self-pay

## 2021-04-04 MED ORDER — FLUARIX QUADRIVALENT 0.5 ML IM SUSY
PREFILLED_SYRINGE | INTRAMUSCULAR | 0 refills | Status: DC
  Filled 2021-04-04: qty 0.5, 1d supply, fill #0

## 2021-04-10 ENCOUNTER — Other Ambulatory Visit (HOSPITAL_BASED_OUTPATIENT_CLINIC_OR_DEPARTMENT_OTHER): Payer: Self-pay

## 2021-04-22 ENCOUNTER — Other Ambulatory Visit: Payer: Self-pay

## 2021-04-22 ENCOUNTER — Other Ambulatory Visit (HOSPITAL_BASED_OUTPATIENT_CLINIC_OR_DEPARTMENT_OTHER): Payer: Self-pay

## 2021-04-22 ENCOUNTER — Ambulatory Visit (INDEPENDENT_AMBULATORY_CARE_PROVIDER_SITE_OTHER): Payer: No Typology Code available for payment source | Admitting: Family Medicine

## 2021-04-22 ENCOUNTER — Encounter: Payer: Self-pay | Admitting: Family Medicine

## 2021-04-22 VITALS — BP 134/80 | HR 99 | Resp 16 | Ht 64.5 in | Wt 229.0 lb

## 2021-04-22 DIAGNOSIS — R000 Tachycardia, unspecified: Secondary | ICD-10-CM

## 2021-04-22 DIAGNOSIS — F419 Anxiety disorder, unspecified: Secondary | ICD-10-CM

## 2021-04-22 DIAGNOSIS — E782 Mixed hyperlipidemia: Secondary | ICD-10-CM

## 2021-04-22 DIAGNOSIS — I10 Essential (primary) hypertension: Secondary | ICD-10-CM

## 2021-04-22 DIAGNOSIS — E785 Hyperlipidemia, unspecified: Secondary | ICD-10-CM

## 2021-04-22 MED ORDER — LOSARTAN POTASSIUM 25 MG PO TABS
25.0000 mg | ORAL_TABLET | Freq: Every day | ORAL | 2 refills | Status: DC
  Filled 2021-04-22 – 2021-05-09 (×2): qty 90, 90d supply, fill #0
  Filled 2021-08-08: qty 90, 90d supply, fill #1
  Filled 2021-11-10: qty 90, 90d supply, fill #2

## 2021-04-22 NOTE — Assessment & Plan Note (Signed)
Problem has improved. Continue CBT once per week.

## 2021-04-22 NOTE — Progress Notes (Signed)
HPI: [name] is a 54 y.o. female, who is here today for chronic disease management.  Last seen on 12/24/20 Since her last visit she has seen rheumatologist, work up negative for lupus.  Hypertension:  Medications:Metoprolol succinate 50 mg daily and Losartan 25 mg daily. BP readings at home:Not checking it at home but sometimes at work, similar to readings today. Side effects:None  Negative for unusual or severe headache, visual changes, exertional chest pain, dyspnea,  focal weakness, or edema.  Lab Results  Component Value Date   CREATININE 0.75 03/25/2021   BUN 16 03/25/2021   NA 139 03/25/2021   K 3.9 03/25/2021   CL 107 03/25/2021   CO2 26 03/25/2021   Sinus tachycardia: Episodes of palpitations are infrequent, no associated symptoms.  Concerned about elevated cholesterol and CVD risk. She is on nonpharmacologic treatment. Exercising twice per week with a Systems analyst, she is also a Engineer, agricultural. She is also eating healthier, has lost some wt.  Lab Results  Component Value Date   CHOL 160 04/22/2020   HDL 37 (L) 04/22/2020   LDLCALC 91 04/22/2020   TRIG 232 (H) 04/22/2020   CHOLHDL 4.3 04/22/2020   Anxiety: She is not longer on Sertraline. Meeting with psychotherapist weekly. She feels like she is dealing better with stress. Negative for depressed mood.  Review of Systems  Constitutional:  Negative for activity change, appetite change, fatigue and fever.  HENT:  Negative for mouth sores, nosebleeds and trouble swallowing.   Respiratory:  Negative for cough and wheezing.   Gastrointestinal:  Negative for abdominal pain, nausea and vomiting.       Negative for changes in bowel habits.  Genitourinary:  Negative for decreased urine volume and hematuria.  Neurological:  Negative for syncope, facial asymmetry and weakness.  Rest of ROS see pertinent positives and negatives in HPI.  Current Outpatient Medications on File Prior to Visit   Medication Sig Dispense Refill   metoprolol succinate (TOPROL-XL) 50 MG 24 hr tablet TAKE 1 TABLET (50 MG TOTAL) BY MOUTH DAILY. TAKE WITH OR IMMEDIATELY FOLLOWING A MEAL. 90 tablet 3   omeprazole (PRILOSEC) 20 MG capsule Take 1 capsule by mouth as needed.   3   SUMAtriptan (IMITREX) 100 MG tablet Take 1 tablet (100 mg total) by mouth once as needed for up to 1 dose for migraine. May repeat in 2 hours if headache persists or recurs. 9 tablet 11   No current facility-administered medications on file prior to visit.   Past Medical History:  Diagnosis Date   H/O LEEP 1990   Hypertension    Migraines    Allergies  Allergen Reactions   Nickel Rash   Strawberry Extract Anaphylaxis and Rash   Social History   Socioeconomic History   Marital status: Widowed    Spouse name: Not on file   Number of children: Not on file   Years of education: Not on file   Highest education level: Not on file  Occupational History   Not on file  Tobacco Use   Smoking status: Never   Smokeless tobacco: Never  Vaping Use   Vaping Use: Never used  Substance and Sexual Activity   Alcohol use: No   Drug use: No   Sexual activity: Not Currently    Birth control/protection: Pill  Other Topics Concern   Not on file  Social History Narrative   Not on file   Social Determinants of Health   Financial Resource  Strain: Not on file  Food Insecurity: Not on file  Transportation Needs: Not on file  Physical Activity: Not on file  Stress: Not on file  Social Connections: Not on file   Vitals:   04/22/21 1458  BP: 134/80  Pulse: 99  Resp: 16  SpO2: 98%   Wt Readings from Last 3 Encounters:  04/22/21 229 lb (103.9 kg)  12/24/20 231 lb (104.8 kg)  12/10/20 231 lb 6.4 oz (105 kg)   Body mass index is 38.7 kg/m.  Physical Exam Vitals and nursing note reviewed.  Constitutional:      General: She is not in acute distress.    Appearance: She is well-developed.  HENT:     Head: Normocephalic and  atraumatic.     Mouth/Throat:     Mouth: Mucous membranes are moist.     Pharynx: Oropharynx is clear.  Eyes:     Conjunctiva/sclera: Conjunctivae normal.  Cardiovascular:     Rate and Rhythm: Normal rate and regular rhythm.     Pulses:          Dorsalis pedis pulses are 2+ on the right side and 2+ on the left side.     Heart sounds: No murmur heard. Pulmonary:     Effort: Pulmonary effort is normal. No respiratory distress.     Breath sounds: Normal breath sounds.  Abdominal:     Palpations: Abdomen is soft. There is no hepatomegaly or mass.     Tenderness: There is no abdominal tenderness.  Lymphadenopathy:     Cervical: No cervical adenopathy.  Skin:    General: Skin is warm.     Findings: No erythema or rash.  Neurological:     General: No focal deficit present.     Mental Status: She is alert and oriented to person, place, and time.     Cranial Nerves: No cranial nerve deficit.     Gait: Gait normal.  Psychiatric:     Comments: Well groomed, good eye contact.   ASSESSMENT AND PLAN:  [name] was seen today for follow-up.  Diagnoses and all orders for this visit:  Essential hypertension BP adequately controlled. Continue losartan 25 mg daily and metoprolol succinate 50 mg daily. Low-salt/DASH diet to continue. Continue monitoring BP regularly.  Hyperlipidemia Continue nonpharmacologic treatment. We discussed current recommendations and indications for pharmacologic treatment for hyperlipidemia. The 10-year ASCVD risk score (Arnett DK, et al., 2019) is: 2.8%   Values used to calculate the score:     Age: 32 years     Sex: Female     Is Non-Hispanic African American: No     Diabetic: No     Tobacco smoker: No     Systolic Blood Pressure: [id number] mmHg     Is BP treated: Yes     HDL Cholesterol: 37 mg/dL     Total Cholesterol: 160 mg/dL   Sinus tachycardia Problem is stable. Continue metoprolol succinate 50 mg daily.  Anxiety disorder Problem has  improved. Continue CBT once per week.  Return in about 6 months (around 10/20/2021) for CPE.Marland Kitchen  Amanda Mack G. [place], MD  [facility]. [place] office.

## 2021-04-22 NOTE — Assessment & Plan Note (Signed)
Continue nonpharmacologic treatment. We discussed current recommendations and indications for pharmacologic treatment for hyperlipidemia. The 10-year ASCVD risk score (Arnett DK, et al., 2019) is: 2.8%   Values used to calculate the score:     Age: 55 years     Sex: Female     Is Non-Hispanic African American: No     Diabetic: No     Tobacco smoker: No     Systolic Blood Pressure: 134 mmHg     Is BP treated: Yes     HDL Cholesterol: 37 mg/dL     Total Cholesterol: 160 mg/dL

## 2021-04-22 NOTE — Patient Instructions (Addendum)
A few things to remember from today's visit:  Essential hypertension - Plan: losartan (COZAAR) 25 MG tablet  If you need refills please call your pharmacy. Do not use My Chart to request refills or for acute issues that need immediate attention.  No change today.  Please be sure medication list is accurate. If a new problem present, please set up appointment sooner than planned today.

## 2021-04-22 NOTE — Assessment & Plan Note (Signed)
Problem is stable. Continue metoprolol succinate 50 mg daily.

## 2021-04-22 NOTE — Progress Notes (Deleted)
   Ms. Shequita Peplinski is a 55 y.o.female, who is here today to follow on HTN.  Last follow up visit: *** Hypertension:  Medications: Losartan 25 mg daily BP readings at home:*** Side effects: none Negative for unusual or severe headache, visual changes, exertional chest pain, dyspnea,  focal weakness, or edema.  Lab Results  Component Value Date   CREATININE 0.75 03/25/2021   BUN 16 03/25/2021   NA 139 03/25/2021   K 3.9 03/25/2021   CL 107 03/25/2021   CO2 26 03/25/2021    Review of Systems Rest see pertinent positives and negatives per HPI.  Current Outpatient Medications on File Prior to Visit  Medication Sig Dispense Refill   losartan (COZAAR) 25 MG tablet Take 1 tablet (25 mg total) by mouth daily. 30 tablet 1   metoprolol succinate (TOPROL-XL) 50 MG 24 hr tablet TAKE 1 TABLET (50 MG TOTAL) BY MOUTH DAILY. TAKE WITH OR IMMEDIATELY FOLLOWING A MEAL. 90 tablet 3   omeprazole (PRILOSEC) 20 MG capsule Take 1 capsule by mouth as needed.   3   SUMAtriptan (IMITREX) 100 MG tablet Take 1 tablet (100 mg total) by mouth once as needed for up to 1 dose for migraine. May repeat in 2 hours if headache persists or recurs. 9 tablet 11   No current facility-administered medications on file prior to visit.     Past Medical History:  Diagnosis Date   H/O LEEP 1990   Hypertension    Migraines     Allergies  Allergen Reactions   Nickel Rash   Strawberry Extract Anaphylaxis and Rash    Social History   Socioeconomic History   Marital status: Widowed    Spouse name: Not on file   Number of children: Not on file   Years of education: Not on file   Highest education level: Not on file  Occupational History   Not on file  Tobacco Use   Smoking status: Never   Smokeless tobacco: Never  Vaping Use   Vaping Use: Never used  Substance and Sexual Activity   Alcohol use: No   Drug use: No   Sexual activity: Not Currently    Birth control/protection: Pill  Other Topics  Concern   Not on file  Social History Narrative   Not on file   Social Determinants of Health   Financial Resource Strain: Not on file  Food Insecurity: Not on file  Transportation Needs: Not on file  Physical Activity: Not on file  Stress: Not on file  Social Connections: Not on file    There were no vitals filed for this visit. Body mass index is 39.04 kg/m.  Physical Exam  ASSESSMENT AND PLAN:    There are no diagnoses linked to this encounter.  No orders of the defined types were placed in this encounter.   No problem-specific Assessment & Plan notes found for this encounter.    No follow-ups on file.   Betty G. Swaziland, MD  [facility]. [place] office.

## 2021-04-22 NOTE — Assessment & Plan Note (Signed)
BP adequately controlled. Continue losartan 25 mg daily and metoprolol succinate 50 mg daily. Low-salt/DASH diet to continue. Continue monitoring BP regularly.

## 2021-05-09 ENCOUNTER — Other Ambulatory Visit (HOSPITAL_BASED_OUTPATIENT_CLINIC_OR_DEPARTMENT_OTHER): Payer: Self-pay

## 2021-05-09 MED ORDER — CARESTART COVID-19 HOME TEST VI KIT
PACK | 0 refills | Status: DC
  Filled 2021-05-09: qty 4, 4d supply, fill #0

## 2021-05-30 ENCOUNTER — Other Ambulatory Visit (HOSPITAL_BASED_OUTPATIENT_CLINIC_OR_DEPARTMENT_OTHER): Payer: Self-pay

## 2021-06-11 ENCOUNTER — Other Ambulatory Visit (HOSPITAL_BASED_OUTPATIENT_CLINIC_OR_DEPARTMENT_OTHER): Payer: Self-pay

## 2021-06-11 MED ORDER — CARESTART COVID-19 HOME TEST VI KIT
PACK | 0 refills | Status: DC
  Filled 2021-06-11: qty 4, 4d supply, fill #0

## 2021-07-09 ENCOUNTER — Encounter: Payer: Self-pay | Admitting: Family Medicine

## 2021-07-09 DIAGNOSIS — R233 Spontaneous ecchymoses: Secondary | ICD-10-CM

## 2021-07-09 DIAGNOSIS — R499 Unspecified voice and resonance disorder: Secondary | ICD-10-CM

## 2021-08-08 ENCOUNTER — Other Ambulatory Visit (HOSPITAL_BASED_OUTPATIENT_CLINIC_OR_DEPARTMENT_OTHER): Payer: Self-pay

## 2021-08-29 ENCOUNTER — Other Ambulatory Visit (HOSPITAL_BASED_OUTPATIENT_CLINIC_OR_DEPARTMENT_OTHER): Payer: Self-pay

## 2021-09-04 ENCOUNTER — Other Ambulatory Visit (HOSPITAL_BASED_OUTPATIENT_CLINIC_OR_DEPARTMENT_OTHER): Payer: Self-pay | Admitting: Obstetrics & Gynecology

## 2021-09-10 ENCOUNTER — Other Ambulatory Visit (HOSPITAL_BASED_OUTPATIENT_CLINIC_OR_DEPARTMENT_OTHER): Payer: Self-pay

## 2021-09-10 MED ORDER — CYCLOBENZAPRINE HCL 10 MG PO TABS
ORAL_TABLET | ORAL | 0 refills | Status: DC
  Filled 2021-09-10: qty 60, 15d supply, fill #0

## 2021-09-10 MED ORDER — METHYLPREDNISOLONE 4 MG PO TBPK
ORAL_TABLET | ORAL | 0 refills | Status: DC
  Filled 2021-09-10: qty 21, 6d supply, fill #0

## 2021-09-15 ENCOUNTER — Telehealth (HOSPITAL_BASED_OUTPATIENT_CLINIC_OR_DEPARTMENT_OTHER): Payer: Self-pay

## 2021-09-15 ENCOUNTER — Other Ambulatory Visit (HOSPITAL_BASED_OUTPATIENT_CLINIC_OR_DEPARTMENT_OTHER): Payer: Self-pay | Admitting: Neurological Surgery

## 2021-09-15 DIAGNOSIS — M4726 Other spondylosis with radiculopathy, lumbar region: Secondary | ICD-10-CM

## 2021-09-16 ENCOUNTER — Telehealth (HOSPITAL_BASED_OUTPATIENT_CLINIC_OR_DEPARTMENT_OTHER): Payer: Self-pay

## 2021-09-18 ENCOUNTER — Telehealth (HOSPITAL_BASED_OUTPATIENT_CLINIC_OR_DEPARTMENT_OTHER): Payer: Self-pay

## 2021-09-22 ENCOUNTER — Telehealth (HOSPITAL_BASED_OUTPATIENT_CLINIC_OR_DEPARTMENT_OTHER): Payer: Self-pay

## 2021-09-22 ENCOUNTER — Other Ambulatory Visit (HOSPITAL_BASED_OUTPATIENT_CLINIC_OR_DEPARTMENT_OTHER): Payer: Self-pay | Admitting: Neurological Surgery

## 2021-09-22 DIAGNOSIS — M4726 Other spondylosis with radiculopathy, lumbar region: Secondary | ICD-10-CM

## 2021-09-25 ENCOUNTER — Ambulatory Visit (HOSPITAL_BASED_OUTPATIENT_CLINIC_OR_DEPARTMENT_OTHER)
Admission: RE | Admit: 2021-09-25 | Discharge: 2021-09-25 | Disposition: A | Discharge status: Home / Self Care | Payer: No Typology Code available for payment source | Source: Ambulatory Visit | Attending: Neurological Surgery | Admitting: Neurological Surgery

## 2021-09-25 DIAGNOSIS — M4726 Other spondylosis with radiculopathy, lumbar region: Secondary | ICD-10-CM | POA: Diagnosis present

## 2021-10-03 ENCOUNTER — Encounter (HOSPITAL_BASED_OUTPATIENT_CLINIC_OR_DEPARTMENT_OTHER): Payer: Self-pay | Admitting: Radiology

## 2021-10-03 ENCOUNTER — Ambulatory Visit (HOSPITAL_BASED_OUTPATIENT_CLINIC_OR_DEPARTMENT_OTHER)
Admission: RE | Admit: 2021-10-03 | Discharge: 2021-10-03 | Disposition: A | Discharge status: Home / Self Care | Payer: No Typology Code available for payment source | Source: Ambulatory Visit | Attending: Obstetrics & Gynecology | Admitting: Obstetrics & Gynecology

## 2021-10-03 DIAGNOSIS — Z1231 Encounter for screening mammogram for malignant neoplasm of breast: Secondary | ICD-10-CM | POA: Diagnosis not present

## 2021-10-17 ENCOUNTER — Ambulatory Visit (INDEPENDENT_AMBULATORY_CARE_PROVIDER_SITE_OTHER): Payer: No Typology Code available for payment source | Admitting: Orthopaedic Surgery

## 2021-10-17 DIAGNOSIS — M47818 Spondylosis without myelopathy or radiculopathy, sacral and sacrococcygeal region: Secondary | ICD-10-CM | POA: Diagnosis not present

## 2021-10-17 NOTE — Progress Notes (Signed)
? ?                            ? ? ?Chief Complaint: Bilateral SI pain ?  ? ? ?History of Present Illness:  ? ? ?Amanda Mack is a 56 y.o. female presents with bilateral right and left L5 hip pain since she was 74 when someone landed on her back On a bunk bed.  Since that time she has been being treated for her lower back.  She has previously had epidural injections with no relief whatsoever.  She has not had physical therapy.  She has been very active here at the [facility] in terms of aquatic classes and personal training.  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  Here today for further assessment for MRI discussion ? ? ? ?Surgical History:   ?None ? ?PMH/PSH/Family History/Social History/Meds/Allergies:   ? ?Past Medical History:  ?Diagnosis Date  ? H/O LEEP 1990  ? Hypertension   ? Migraines   ? ?Past Surgical History:  ?Procedure Laterality Date  ? KNEE ARTHROSCOPY Left 2019  ? SHOULDER SURGERY    ? ?Social History  ? ?Socioeconomic History  ? Marital status: Widowed  ?  Spouse name: Not on file  ? Number of children: Not on file  ? Years of education: Not on file  ? Highest education level: Not on file  ?Occupational History  ? Not on file  ?Tobacco Use  ? Smoking status: Never  ? Smokeless tobacco: Never  ?Vaping Use  ? Vaping Use: Never used  ?Substance and Sexual Activity  ? Alcohol use: No  ? Drug use: No  ? Sexual activity: Not Currently  ?  Birth control/protection: Pill  ?Other Topics Concern  ? Not on file  ?Social History Narrative  ? Not on file  ? ?Social Determinants of Health  ? ?Financial Resource Strain: Not on file  ?Food Insecurity: Not on file  ?Transportation Needs: Not on file  ?Physical Activity: Not on file  ?Stress: Not on file  ?Social Connections: Not on file  ? ?Family History  ?Adopted: Yes  ? ?Allergies  ?Allergen Reactions  ? Nickel Rash  ? Strawberry Extract Anaphylaxis and Rash  ? ?Current Outpatient Medications  ?Medication Sig  Dispense Refill  ? COVID-19 At Home Antigen Test [facility] COVID-19 HOME TEST) KIT Use as directed 4 each 0  ? cyclobenzaprine (FLEXERIL) 10 MG tablet one tablet every 6 hours as needed for muscle spasm 60 tablet 0  ? losartan (COZAAR) 25 MG tablet Take 1 tablet (25 mg total) by mouth daily. 90 tablet 2  ? methylPREDNISolone (MEDROL) 4 MG TBPK tablet Take as directed on packaging. 21 tablet 0  ? metoprolol succinate (TOPROL-XL) 50 MG 24 hr tablet TAKE 1 TABLET (50 MG TOTAL) BY MOUTH DAILY. TAKE WITH OR IMMEDIATELY FOLLOWING A MEAL. 90 tablet 3  ? omeprazole (PRILOSEC) 20 MG capsule Take 1 capsule by mouth as needed.   3  ? SUMAtriptan (IMITREX) 100 MG tablet Take 1 tablet (100 mg total) by mouth once as needed for up to 1 dose for migraine. May repeat in 2 hours if headache persists or recurs. 9 tablet 11  ? ?No current facility-administered medications for this visit.  ? ?No results found. ? ?Review of Systems:   ?A ROS was performed including pertinent positives and negatives as documented in the HPI. ? ?Physical Exam :   ?  Constitutional: NAD and appears stated age ?Neurological: Alert and oriented ?Psych: Appropriate affect and cooperative ?There were no vitals taken for this visit.  ? ?Comprehensive Musculoskeletal Exam:   ? ?Tenderness palpation of bilateral SI joint.  There is no radiation to the leg.  Distal strength and sensation is intact. ? ?Imaging:   ? 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 ?I personally reviewed and interpreted the radiographs. ? ? ?Assessment:   ?56 y.o. female with ongoing bilateral SI type pain which is very symptomatic for her.  I described that this is not uncommon in the setting of a transitional vertebra.  At this time she continues to work on strengthening and stretching exercises of this.  I stated that she may pursue an SI injection when this does flareup on her.  She will  contact me as needed should she want an additional SI injection ? ?Plan :   ? ?-She will contact me as needed should she want additional SI injection ? ? ? ? ?I personally saw and evaluated the patient, and participated in the management and treatment plan. ? ?Vanetta Mulders, MD ?Attending Physician, Orthopedic Surgery ? ?This document was dictated using Systems analyst. A reasonable attempt at proof reading has been made to minimize errors. ?

## 2021-10-20 ENCOUNTER — Ambulatory Visit (INDEPENDENT_AMBULATORY_CARE_PROVIDER_SITE_OTHER): Payer: No Typology Code available for payment source | Admitting: Obstetrics & Gynecology

## 2021-10-20 ENCOUNTER — Other Ambulatory Visit (HOSPITAL_BASED_OUTPATIENT_CLINIC_OR_DEPARTMENT_OTHER): Payer: Self-pay

## 2021-10-20 ENCOUNTER — Encounter (HOSPITAL_BASED_OUTPATIENT_CLINIC_OR_DEPARTMENT_OTHER): Payer: Self-pay | Admitting: Obstetrics & Gynecology

## 2021-10-20 VITALS — BP 130/80 | Ht 65.0 in | Wt 231.0 lb

## 2021-10-20 DIAGNOSIS — Z01419 Encounter for gynecological examination (general) (routine) without abnormal findings: Secondary | ICD-10-CM

## 2021-10-20 DIAGNOSIS — I10 Essential (primary) hypertension: Secondary | ICD-10-CM

## 2021-10-20 DIAGNOSIS — K219 Gastro-esophageal reflux disease without esophagitis: Secondary | ICD-10-CM

## 2021-10-20 DIAGNOSIS — E781 Pure hyperglyceridemia: Secondary | ICD-10-CM

## 2021-10-20 DIAGNOSIS — Z8742 Personal history of other diseases of the female genital tract: Secondary | ICD-10-CM

## 2021-10-20 MED ORDER — OMEPRAZOLE 20 MG PO CPDR
20.0000 mg | DELAYED_RELEASE_CAPSULE | ORAL | 3 refills | Status: AC | PRN
  Filled 2021-10-20: qty 30, 30d supply, fill #0

## 2021-10-20 NOTE — Progress Notes (Signed)
56 y.o. G0P0000 Widowed White or Caucasian female here for annual exam.  Doing well.  Denies vaginal bleeding.   ? ?No LMP recorded. (Menstrual status: Other).          ?Sexually active: No.  ?The current method of family planning is none.    ? ?The pregnancy intention screening data noted above was reviewed. Potential methods of contraception were discussed. The patient elected to proceed with No data recorded.  ?Exercising: Yes.    Gym/ health club routine includes cardio. ?Smoker:  no ? ?Health Maintenance: ?Pap:  09/2020, neg HR HPV ?History of abnormal Pap:  yes LEEP age 62 ?MMG:  09/2021 ?Colonoscopy:  release signed again.  Pt reports had at [facility] ?BMD:   n/a ?Screening Labs: will do with Dr. Swaziland ? ? reports that she has never smoked. She has never been exposed to tobacco smoke. She has never used smokeless tobacco. She reports that she does not drink alcohol and does not use drugs. ? ?Past Medical History:  ?Diagnosis Date  ? H/O LEEP 1990  ? Hypertension   ? Migraines   ? ? ?Past Surgical History:  ?Procedure Laterality Date  ? KNEE ARTHROSCOPY Left 2019  ? SHOULDER SURGERY    ? ? ?Current Outpatient Medications  ?Medication Sig Dispense Refill  ? cyclobenzaprine (FLEXERIL) 10 MG tablet one tablet every 6 hours as needed for muscle spasm 60 tablet 0  ? losartan (COZAAR) 25 MG tablet Take 1 tablet (25 mg total) by mouth daily. 90 tablet 2  ? metoprolol succinate (TOPROL-XL) 50 MG 24 hr tablet TAKE 1 TABLET (50 MG TOTAL) BY MOUTH DAILY. TAKE WITH OR IMMEDIATELY FOLLOWING A MEAL. 90 tablet 3  ? SUMAtriptan (IMITREX) 100 MG tablet Take 1 tablet (100 mg total) by mouth once as needed for up to 1 dose for migraine. May repeat in 2 hours if headache persists or recurs. 9 tablet 11  ? omeprazole (PRILOSEC) 20 MG capsule Take 1 capsule by mouth as needed.  (Patient not taking: Reported on 10/20/2021)  3  ? ?No current facility-administered medications for this visit.  ? ? ?Family History  ?Adopted: Yes   ? ? ?A comprehensive review of systems was negative. ? ?Exam:   ?BP 130/80 (BP Location: Right Arm, Patient Position: Sitting, Cuff Size: Large)   Ht 5\' 5"  (1.651 m)   Wt 231 lb (104.8 kg)   BMI 38.44 kg/m?   Height: 5\' 5"  (165.1 cm) ? ?General appearance: alert, cooperative and appears stated age ?Head: Normocephalic, without obvious abnormality, atraumatic ?Neck: no adenopathy, supple, symmetrical, trachea midline and thyroid normal to inspection and palpation ?Lungs: clear to auscultation bilaterally ?Breasts: normal appearance, no masses or tenderness ?Heart: regular rate and rhythm ?Abdomen: soft, non-tender; bowel sounds normal; no masses,  no organomegaly ?Extremities: extremities normal, atraumatic, no cyanosis or edema ?Skin: Skin color, texture, turgor normal. No rashes or lesions ?Lymph nodes: Cervical, supraclavicular, and axillary nodes normal. ?No abnormal inguinal nodes palpated ?Neurologic: Grossly normal ? ? ?Pelvic: External genitalia:  no lesions ?             Urethra:  normal appearing urethra with no masses, tenderness or lesions ?             Bartholins and Skenes: normal    ?             Vagina: normal appearing vagina with normal color and no discharge, no lesions ?  Cervix: no lesions ?             Pap taken: No. ?Bimanual Exam:  Uterus:  normal size, contour, position, consistency, mobility, non-tender ?             Adnexa: normal adnexa and no mass, fullness, tenderness ?              Rectovaginal: Confirms ?              Anus:  normal sphincter tone, no lesions ? ?Chaperone, Beola Cord, CMA, was present for exam. ? ?Assessment/Plan: ?1. Well woman exam with routine gynecological exam ?- pap neg with neg HR HPV 2022.  Not indicated today. ?- MMG normal 09/2021 ?- Colonoscopy release signed again ?- BMD will be planned closer to age 45 ?- lab work done with Dr. Swaziland ?- vaccines reviewed/updated ? ?2. Gastroesophageal reflux disease without esophagitis ?- omeprazole  (PRILOSEC) 20 MG capsule; Take 1 capsule (20 mg total) by mouth as needed.  Dispense: 30 capsule; Refill: 3 ? ?3. Essential hypertension ?- on treatment and followed by Dr. Swaziland ? ?4. History of abnormal cervical Pap smear ?- remote hx and hx of LEEP  ?

## 2021-10-22 ENCOUNTER — Encounter (HOSPITAL_BASED_OUTPATIENT_CLINIC_OR_DEPARTMENT_OTHER): Payer: Self-pay | Admitting: Obstetrics & Gynecology

## 2021-11-10 ENCOUNTER — Other Ambulatory Visit (HOSPITAL_BASED_OUTPATIENT_CLINIC_OR_DEPARTMENT_OTHER): Payer: Self-pay

## 2021-11-25 ENCOUNTER — Other Ambulatory Visit (HOSPITAL_BASED_OUTPATIENT_CLINIC_OR_DEPARTMENT_OTHER): Payer: Self-pay

## 2021-11-27 ENCOUNTER — Encounter: Payer: Self-pay | Admitting: Family Medicine

## 2021-11-27 ENCOUNTER — Telehealth (INDEPENDENT_AMBULATORY_CARE_PROVIDER_SITE_OTHER): Payer: No Typology Code available for payment source | Admitting: Family Medicine

## 2021-11-27 ENCOUNTER — Other Ambulatory Visit (HOSPITAL_BASED_OUTPATIENT_CLINIC_OR_DEPARTMENT_OTHER): Payer: Self-pay

## 2021-11-27 DIAGNOSIS — R0981 Nasal congestion: Secondary | ICD-10-CM

## 2021-11-27 DIAGNOSIS — R051 Acute cough: Secondary | ICD-10-CM

## 2021-11-27 MED ORDER — BENZONATATE 100 MG PO CAPS
ORAL_CAPSULE | ORAL | 0 refills | Status: DC
  Filled 2021-11-27: qty 30, 8d supply, fill #0

## 2021-11-27 MED ORDER — AMOXICILLIN-POT CLAVULANATE 875-125 MG PO TABS
1.0000 | ORAL_TABLET | Freq: Two times a day (BID) | ORAL | 0 refills | Status: DC
  Filled 2021-11-27: qty 20, 10d supply, fill #0

## 2021-11-27 NOTE — Progress Notes (Signed)
Virtual Visit via Video Note  I connected with Amanda Mack  on 11/27/21 at 12:20 PM EDT by a video enabled telemedicine application and verified that I am speaking with the correct person using two identifiers.  Location patient: [place] Location provider:work or home office Persons participating in the virtual visit: patient, provider  I discussed the limitations and requested verbal permission for telemedicine visit. The patient expressed understanding and agreed to proceed.   HPI:  Acute telemedicine visit for : -Onset: a few weeks ago with allergies getting out of control, but feels like has gotten worse now with thick, yucky, discolored, some upper tooth pain, some sinus discomfort maxillary -covid testing negative -Denies:CP, SOB, NVD, fever -Has tried: nettie pot -Pertinent past medical history: see below -Pertinent medication allergies: Allergies  Allergen Reactions   Nickel Rash   Strawberry Extract Anaphylaxis and Rash  -COVID-19 vaccine status:  Immunization History  Administered Date(s) Administered   Influenza,inj,Quad PF,6+ Mos 04/18/2021   Influenza-Unspecified 04/04/2021   PFIZER(Purple Top)SARS-COV-2 Vaccination 03/29/2020, 04/19/2020   Td 01/01/2015     ROS: See pertinent positives and negatives per HPI.  Past Medical History:  Diagnosis Date   H/O LEEP 1990   Hypertension    Migraines     Past Surgical History:  Procedure Laterality Date   KNEE ARTHROSCOPY Left 2019   SHOULDER SURGERY       Current Outpatient Medications:    amoxicillin-clavulanate (AUGMENTIN) 875-125 MG tablet, Take 1 tablet by mouth 2 (two) times daily., Disp: 20 tablet, Rfl: 0   benzonatate (TESSALON PERLES) 100 MG capsule, Take 1-2 capsules up to twice daily as needed for cough, Disp: 30 capsule, Rfl: 0   cyclobenzaprine (FLEXERIL) 10 MG tablet, one tablet every 6 hours as needed for muscle spasm, Disp: 60 tablet, Rfl: 0   losartan (COZAAR) 25 MG tablet, Take 1 tablet (25 mg total) by  mouth daily., Disp: 90 tablet, Rfl: 2   metoprolol succinate (TOPROL-XL) 50 MG 24 hr tablet, TAKE 1 TABLET (50 MG TOTAL) BY MOUTH DAILY. TAKE WITH OR IMMEDIATELY FOLLOWING A MEAL., Disp: 90 tablet, Rfl: 3   omeprazole (PRILOSEC) 20 MG capsule, Take 1 capsule (20 mg total) by mouth as needed., Disp: 30 capsule, Rfl: 3   SUMAtriptan (IMITREX) 100 MG tablet, Take 1 tablet (100 mg total) by mouth once as needed for up to 1 dose for migraine. May repeat in 2 hours if headache persists or recurs., Disp: 9 tablet, Rfl: 11  EXAM:  VITALS per patient if applicable:  GENERAL: alert, oriented, appears well and in no acute distress  HEENT: atraumatic, conjunttiva clear, no obvious abnormalities on inspection of external nose and ears  NECK: normal movements of the head and neck  LUNGS: on inspection no signs of respiratory distress, breathing rate appears normal, no obvious gross SOB, gasping or wheezing  CV: no obvious cyanosis  MS: moves all visible extremities without noticeable abnormality  PSYCH/NEURO: pleasant and cooperative, no obvious depression or anxiety, speech and thought processing grossly intact  ASSESSMENT AND PLAN:  Discussed the following assessment and plan:  Nasal congestion  Acute cough  -we discussed possible serious and likely etiologies, options for evaluation and workup, limitations of telemedicine visit vs in person visit, treatment, treatment risks and precautions. Pt is agreeable to treatment via telemedicine at this moment. Query Allergies, VURI, reaction to smoke vs developing sinusitis vs other. She is concerned for sinusitis and has daughter's wedding this weekend. Discussed nasal saline rinses, short course nasal decongestant, sent  Rx for cough medication. Discussed risks/indications for abx and sent augment after discussion appropriate use in case worsening or not improving. Work/School slipped offered:declined  Advised to seek prompt virtual visit or in person  care if worsening, new symptoms arise, or if is not improving with treatment as expected per our conversation of expected course. Discussed options for follow up care. Did let this patient know that I do telemedicine on Tuesdays and Thursdays for [facility] and those are the days I am logged into the system. Advised to schedule follow up visit with PCP, [facility] virtual visits or [facility] if any further questions or concerns to avoid delays in care.   I discussed the assessment and treatment plan with the patient. The patient was provided an opportunity to ask questions and all were answered. The patient agreed with the plan and demonstrated an understanding of the instructions.     Terressa Koyanagi, DO

## 2021-11-27 NOTE — Patient Instructions (Signed)
-  I sent the medication(s) we discussed to your pharmacy: Meds ordered this encounter  Medications   benzonatate (TESSALON PERLES) 100 MG capsule    Sig: Take 1-2 capsules up to twice daily as needed for cough    Dispense:  30 capsule    Refill:  0   amoxicillin-clavulanate (AUGMENTIN) 875-125 MG tablet    Sig: Take 1 tablet by mouth 2 (two) times daily.    Dispense:  20 tablet    Refill:  0     I hope you are feeling better soon!  Seek in person care promptly if your symptoms worsen, new concerns arise or you are not improving with treatment.  It was nice to meet you today. I help [facility] out with telemedicine visits on Tuesdays and Thursdays and am happy to help if you need a virtual follow up visit on those days. Otherwise, if you have any concerns or questions following this visit please schedule a follow up visit with your Primary Care office or seek care at a local urgent care clinic to avoid delays in care. If you are having severe or life threatening symptoms please call 911 and/or go to the nearest emergency room.

## 2021-11-27 NOTE — Telephone Encounter (Signed)
I called the patient and scheduled a virtual visit today with Dr Maudie Mercury as PCP is out of the office.

## 2021-12-08 ENCOUNTER — Encounter: Payer: Self-pay | Admitting: Family Medicine

## 2021-12-29 NOTE — Progress Notes (Signed)
HPI: [name] is a 56 y.o. female, who is here today for her routine physical.  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 She eats out most of the time but in general she feels she follows a healthful diet, most of the time less than 2000 cal/day. She has noted that her clothes fit looser, she has to buy new clothes, smaller size.  Chronic medical problems: Hypertension, sinus tachycardia, GERD, and migraines among some.  Immunization History  Administered Date(s) Administered   Influenza,inj,Quad PF,6+ Mos 04/18/2021   Influenza-Unspecified 04/04/2021   PFIZER(Purple Top)SARS-COV-2 Vaccination 03/29/2020, 04/19/2020   Td 01/01/2015   Health Maintenance  Topic Date Due   Zoster Vaccines- Shingrix (1 of 2) Never done   COLONOSCOPY (Pts 45-3yrs Insurance coverage will need to be confirmed)  Never done   COVID-19 Vaccine (3 - Pfizer risk series) 01/15/2022 (Originally 05/17/2020)   HIV Screening  12/31/2022 (Originally 02/14/1981)   INFLUENZA VACCINE  01/20/2022   PAP SMEAR-Modifier  09/24/2023   MAMMOGRAM  10/04/2023   TETANUS/TDAP  12/31/2024   Hepatitis C Screening  Completed   HPV VACCINES  Aged Out   She saw Dr Hyacinth Meeker on 10/20/21 for ehr routine preventive female care.  She has no new concerns today. Hyperlipidemia: Currently she is on nonpharmacologic treatment. Lab Results  Component Value Date   CHOL 160 04/22/2020   HDL 37 (L) 04/22/2020   LDLCALC 91 04/22/2020   TRIG 232 (H) 04/22/2020   CHOLHDL 4.3 04/22/2020  Hypertension Sinus tachycardia: Currently she is on metoprolol succinate 50 mg daily and losartan 25 mg daily. Episodes of palpitations are not frequent.  Lab Results  Component Value Date   CREATININE 0.75 03/25/2021   BUN 16 03/25/2021   NA 139 03/25/2021   K 3.9 03/25/2021   CL 107 03/25/2021   CO2 26 03/25/2021   Lab Results  Component Value Date   TSH 1.72 12/24/2020    Vitamin D deficiency: She is on OTC vitamin D 5000 units every other day. Last 25 OH vitamin D was 18 in 04/2020..  Adjustment disorder with depressed mood after the death of her husband about 1-1/2 years ago. She is no longer on pharmacologic treatment. She sees a Veterinary surgeon weekly.  Review of Systems  Constitutional:  Negative for appetite change and fever.  HENT:  Negative for hearing loss, mouth sores, sore throat, trouble swallowing and voice change.   Eyes:  Negative for redness and visual disturbance.  Respiratory:  Negative for cough, shortness of breath and wheezing.   Cardiovascular:  Negative for chest pain and leg swelling.  Gastrointestinal:  Negative for abdominal pain, nausea and vomiting.       No changes in bowel habits.  Endocrine: Negative for cold intolerance, heat intolerance, polydipsia, polyphagia and polyuria.  Genitourinary:  Negative for decreased urine volume, dysuria, hematuria, vaginal bleeding and vaginal discharge.  Musculoskeletal:  Negative for gait problem and myalgias.  Skin:  Negative for color change and rash.  Allergic/Immunologic: Negative for environmental allergies.  Neurological:  Negative for syncope, weakness and headaches.  Hematological:  Negative for adenopathy. Does not bruise/bleed easily.  Psychiatric/Behavioral:  Negative for confusion. The patient is not nervous/anxious.   All other systems reviewed and are negative.  Current Outpatient Medications on File Prior to Visit  Medication Sig Dispense Refill   cyclobenzaprine (FLEXERIL) 10 MG tablet one tablet every 6 hours as needed for muscle spasm 60  tablet 0   omeprazole (PRILOSEC) 20 MG capsule Take 1 capsule (20 mg total) by mouth as needed. 30 capsule 3   SUMAtriptan (IMITREX) 100 MG tablet Take 1 tablet (100 mg total) by mouth once as needed for up to 1 dose for migraine. May repeat in 2 hours if headache persists or recurs. 9 tablet 11   No current facility-administered  medications on file prior to visit.   Past Medical History:  Diagnosis Date   H/O LEEP 1990   Hypertension    Migraines    Past Surgical History:  Procedure Laterality Date   KNEE ARTHROSCOPY Left 2019   SHOULDER SURGERY     Allergies  Allergen Reactions   Nickel Rash   Strawberry Extract Anaphylaxis and Rash   Family History  Adopted: Yes   Social History   Socioeconomic History   Marital status: Widowed    Spouse name: Not on file   Number of children: Not on file   Years of education: Not on file   Highest education level: Not on file  Occupational History   Not on file  Tobacco Use   Smoking status: Never    Passive exposure: Never   Smokeless tobacco: Never  Vaping Use   Vaping Use: Never used  Substance and Sexual Activity   Alcohol use: No   Drug use: No   Sexual activity: Not Currently    Birth control/protection: Pill  Other Topics Concern   Not on file  Social History Narrative   Not on file   Social Determinants of Health   Financial Resource Strain: Not on file  Food Insecurity: Not on file  Transportation Needs: Not on file  Physical Activity: Not on file  Stress: Not on file  Social Connections: Not on file   Vitals:   12/30/21 0947  BP: 120/80  Pulse: 90  Resp: 12  SpO2: 97%   Body mass index is 38.77 kg/m. Wt Readings from Last 3 Encounters:  12/30/21 233 lb (105.7 kg)  10/20/21 231 lb (104.8 kg)  04/22/21 229 lb (103.9 kg)  Physical Exam Vitals and nursing note reviewed.  Constitutional:      General: She is not in acute distress.    Appearance: She is well-developed.  HENT:     Head: Normocephalic and atraumatic.     Right Ear: Hearing, tympanic membrane, ear canal and external ear normal.     Left Ear: Hearing, tympanic membrane, ear canal and external ear normal.     Mouth/Throat:     Mouth: Mucous membranes are moist.     Pharynx: Oropharynx is clear. Uvula midline.  Eyes:     Extraocular Movements: Extraocular  movements intact.     Conjunctiva/sclera: Conjunctivae normal.     Pupils: Pupils are equal, round, and reactive to light.  Neck:     Thyroid: No thyromegaly.     Trachea: No tracheal deviation.  Cardiovascular:     Rate and Rhythm: Normal rate and regular rhythm.     Pulses:          Dorsalis pedis pulses are 2+ on the right side and 2+ on the left side.     Heart sounds: No murmur heard. Pulmonary:     Effort: Pulmonary effort is normal. No respiratory distress.     Breath sounds: Normal breath sounds.  Abdominal:     Palpations: Abdomen is soft. There is no hepatomegaly or mass.     Tenderness: There is no abdominal tenderness.  Genitourinary:    Comments: Deferred to gyn. Musculoskeletal:     Comments: No major deformity or signs of synovitis appreciated.  Lymphadenopathy:     Cervical: No cervical adenopathy.     Upper Body:     Right upper body: No supraclavicular adenopathy.     Left upper body: No supraclavicular adenopathy.  Skin:    General: Skin is warm.     Findings: No erythema or rash.  Neurological:     General: No focal deficit present.     Mental Status: She is alert and oriented to person, place, and time.     Cranial Nerves: No cranial nerve deficit.     Coordination: Coordination normal.     Gait: Gait normal.     Deep Tendon Reflexes:     Reflex Scores:      Bicep reflexes are 2+ on the right side and 2+ on the left side.      Patellar reflexes are 2+ on the right side and 2+ on the left side. Psychiatric:     Comments: Well groomed, good eye contact.   ASSESSMENT AND PLAN:  [name] was here today annual physical examination.  Orders Placed This Encounter  Procedures   VITAMIN D 25 Hydroxy (Vit-D Deficiency, Fractures)   Lipid panel   Hemoglobin A1c   Comprehensive metabolic panel   Ambulatory referral to Gastroenterology    Amanda Mack was seen today for annual exam.  Diagnoses and all orders for this visit:  Routine general  medical examination at a health care facility  Essential hypertension -     metoprolol succinate (TOPROL-XL) 50 MG 24 hr tablet; TAKE 1 TABLET (50 MG TOTAL) BY MOUTH DAILY. TAKE WITH OR IMMEDIATELY FOLLOWING A MEAL. -     losartan (COZAAR) 25 MG tablet; Take 1 tablet (25 mg total) by mouth daily.  Hyperlipidemia, unspecified hyperlipidemia type -     Lipid panel; Future -     Lipid panel  Vitamin D deficiency, unspecified -     VITAMIN D 25 Hydroxy (Vit-D Deficiency, Fractures); Future -     VITAMIN D 25 Hydroxy (Vit-D Deficiency, Fractures)  Colon cancer screening -     Ambulatory referral to Gastroenterology  Screening for endocrine, metabolic and immunity disorder -     Hemoglobin A1c; Future -     Comprehensive metabolic panel; Future -     Comprehensive metabolic panel -     Hemoglobin A1c    Vitamin D deficiency, unspecified Continue Vit D 5000 U every other day. Further recommendations according to 25 OH vit D result.  Hyperlipidemia Non pharmacologic treatment recommended for now. Further recommendations will be given according to 10 years CVD risk score and lipid panel numbers.  Essential hypertension BP adequately controlled. Continue metoprolol succinate 50 mg daily and losartan 25 mg daily. Continue monitoring BP regularly. As far as BP continues adequately controlled, labs are otherwise stable/normal, and no new problems present, I think is appropriate to follow annually.  Return in 1 year (on 12/31/2022).  Amanda Frisbie G. [place], MD  [facility]. [place] office.

## 2021-12-30 ENCOUNTER — Other Ambulatory Visit (HOSPITAL_BASED_OUTPATIENT_CLINIC_OR_DEPARTMENT_OTHER): Payer: Self-pay

## 2021-12-30 ENCOUNTER — Ambulatory Visit (INDEPENDENT_AMBULATORY_CARE_PROVIDER_SITE_OTHER): Payer: No Typology Code available for payment source | Admitting: Family Medicine

## 2021-12-30 ENCOUNTER — Encounter: Payer: Self-pay | Admitting: Family Medicine

## 2021-12-30 VITALS — BP 120/80 | HR 90 | Resp 12 | Ht 65.0 in | Wt 233.0 lb

## 2021-12-30 DIAGNOSIS — I10 Essential (primary) hypertension: Secondary | ICD-10-CM | POA: Diagnosis not present

## 2021-12-30 DIAGNOSIS — E785 Hyperlipidemia, unspecified: Secondary | ICD-10-CM | POA: Diagnosis not present

## 2021-12-30 DIAGNOSIS — Z13228 Encounter for screening for other metabolic disorders: Secondary | ICD-10-CM

## 2021-12-30 DIAGNOSIS — Z1329 Encounter for screening for other suspected endocrine disorder: Secondary | ICD-10-CM | POA: Diagnosis not present

## 2021-12-30 DIAGNOSIS — Z0000 Encounter for general adult medical examination without abnormal findings: Secondary | ICD-10-CM

## 2021-12-30 DIAGNOSIS — Z1211 Encounter for screening for malignant neoplasm of colon: Secondary | ICD-10-CM

## 2021-12-30 DIAGNOSIS — Z130 Encounter for screening for diseases of the blood and blood-forming organs and certain disorders involving the immune mechanism: Secondary | ICD-10-CM

## 2021-12-30 DIAGNOSIS — E559 Vitamin D deficiency, unspecified: Secondary | ICD-10-CM

## 2021-12-30 LAB — LIPID PANEL
Cholesterol: 184 mg/dL (ref 0–200)
HDL: 42.7 mg/dL (ref >39.00)
LDL Cholesterol: 123 mg/dL — ABNORMAL HIGH (ref 0–99)
NonHDL: 141.09
Total CHOL/HDL Ratio: 4
Triglycerides: 90 mg/dL (ref 0.0–149.0)
VLDL: 18 mg/dL (ref 0.0–40.0)

## 2021-12-30 LAB — COMPREHENSIVE METABOLIC PANEL
ALT: 23 U/L (ref 0–35)
AST: 21 U/L (ref 0–37)
Albumin: 4.1 g/dL (ref 3.5–5.2)
Alkaline Phosphatase: 71 U/L (ref 39–117)
BUN: 15 mg/dL (ref 6–23)
CO2: 28 mEq/L (ref 19–32)
Calcium: 9.6 mg/dL (ref 8.4–10.5)
Chloride: 106 mEq/L (ref 96–112)
Creatinine, Ser: 0.8 mg/dL (ref 0.40–1.20)
GFR: 83.85 mL/min (ref >60.00)
Glucose, Bld: 87 mg/dL (ref 70–99)
Potassium: 4.5 mEq/L (ref 3.5–5.1)
Sodium: 140 mEq/L (ref 135–145)
Total Bilirubin: 0.4 mg/dL (ref 0.2–1.2)
Total Protein: 6.5 g/dL (ref 6.0–8.3)

## 2021-12-30 LAB — VITAMIN D 25 HYDROXY (VIT D DEFICIENCY, FRACTURES): VITD: 26.66 ng/mL — ABNORMAL LOW (ref 30.00–100.00)

## 2021-12-30 LAB — HEMOGLOBIN A1C: Hgb A1c MFr Bld: 5.5 % (ref 4.6–6.5)

## 2021-12-30 MED ORDER — LOSARTAN POTASSIUM 25 MG PO TABS
25.0000 mg | ORAL_TABLET | Freq: Every day | ORAL | 3 refills | Status: DC
  Filled 2021-12-30 – 2022-01-28 (×2): qty 90, 90d supply, fill #0
  Filled 2022-04-18: qty 90, 90d supply, fill #1
  Filled 2022-08-03 (×2): qty 30, 30d supply, fill #2

## 2021-12-30 MED ORDER — METOPROLOL SUCCINATE ER 50 MG PO TB24
50.0000 mg | ORAL_TABLET | Freq: Every day | ORAL | 3 refills | Status: DC
  Filled 2021-12-30 – 2022-02-27 (×2): qty 90, 90d supply, fill #0
  Filled 2022-05-28: qty 30, 30d supply, fill #1
  Filled 2022-07-02: qty 30, 30d supply, fill #2
  Filled 2022-08-03: qty 30, 30d supply, fill #3

## 2021-12-30 NOTE — Assessment & Plan Note (Signed)
Continue Vit D 5000 U every other day. Further recommendations according to 25 OH vit D result.

## 2021-12-30 NOTE — Assessment & Plan Note (Signed)
BP adequately controlled. Continue metoprolol succinate 50 mg daily and losartan 25 mg daily. Continue monitoring BP regularly. As far as BP continues adequately controlled, labs are otherwise stable/normal, and no new problems present, I think is appropriate to follow annually.

## 2021-12-30 NOTE — Patient Instructions (Addendum)
A few things to remember from today's visit:  Routine general medical examination at a health care facility  Essential hypertension  Hyperlipidemia, unspecified hyperlipidemia type - Plan: Lipid panel  Vitamin D deficiency, unspecified - Plan: VITAMIN D 25 Hydroxy (Vit-D Deficiency, Fractures)  Colon cancer screening - Plan: Ambulatory referral to Gastroenterology  Screening for endocrine, metabolic and immunity disorder - Plan: Hemoglobin A1c, Comprehensive metabolic panel  If you need refills please call your pharmacy. Do not use My Chart to request refills or for acute issues that need immediate attention.   Please be sure medication list is accurate. If a new problem present, please set up appointment sooner than planned today.        Health Maintenance, Female Adopting a healthy lifestyle and getting preventive care are important in promoting health and wellness. Ask your health care provider about: The right schedule for you to have regular tests and exams. Things you can do on your own to prevent diseases and keep yourself healthy. What should I know about diet, weight, and exercise? Eat a healthy diet  Eat a diet that includes plenty of vegetables, fruits, low-fat dairy products, and lean protein. Do not eat a lot of foods that are high in solid fats, added sugars, or sodium. Maintain a healthy weight Body mass index (BMI) is used to identify weight problems. It estimates body fat based on height and weight. Your health care provider can help determine your BMI and help you achieve or maintain a healthy weight. Get regular exercise Get regular exercise. This is one of the most important things you can do for your health. Most adults should: Exercise for at least 150 minutes each week. The exercise should increase your heart rate and make you sweat (moderate-intensity exercise). Do strengthening exercises at least twice a week. This is in addition to the  moderate-intensity exercise. Spend less time sitting. Even light physical activity can be beneficial. Watch cholesterol and blood lipids Have your blood tested for lipids and cholesterol at 54 years of age, then have this test every 5 years. Have your cholesterol levels checked more often if: Your lipid or cholesterol levels are high. You are older than 54 years of age. You are at high risk for heart disease. What should I know about cancer screening? Depending on your health history and family history, you may need to have cancer screening at various ages. This may include screening for: Breast cancer. Cervical cancer. Colorectal cancer. Skin cancer. Lung cancer. What should I know about heart disease, diabetes, and high blood pressure? Blood pressure and heart disease High blood pressure causes heart disease and increases the risk of stroke. This is more likely to develop in people who have high blood pressure readings or are overweight. Have your blood pressure checked: Every 3-5 years if you are 46-46 years of age. Every year if you are 39 years old or older. Diabetes Have regular diabetes screenings. This checks your fasting blood sugar level. Have the screening done: Once every three years after age 40 if you are at a normal weight and have a low risk for diabetes. More often and at a younger age if you are overweight or have a high risk for diabetes. What should I know about preventing infection? Hepatitis B If you have a higher risk for hepatitis B, you should be screened for this virus. Talk with your health care provider to find out if you are at risk for hepatitis B infection. Hepatitis C Testing is  recommended for: Everyone born from [birth year] through 1963. Anyone with known risk factors for hepatitis C. Sexually transmitted infections (STIs) Get screened for STIs, including gonorrhea and chlamydia, if: You are sexually active and are younger than 54 years of age. You are  older than 54 years of age and your health care provider tells you that you are at risk for this type of infection. Your sexual activity has changed since you were last screened, and you are at increased risk for chlamydia or gonorrhea. Ask your health care provider if you are at risk. Ask your health care provider about whether you are at high risk for HIV. Your health care provider may recommend a prescription medicine to help prevent HIV infection. If you choose to take medicine to prevent HIV, you should first get tested for HIV. You should then be tested every 3 months for as long as you are taking the medicine. Pregnancy If you are about to stop having your period (premenopausal) and you may become pregnant, seek counseling before you get pregnant. Take 400 to 800 micrograms (mcg) of folic acid every day if you become pregnant. Ask for birth control (contraception) if you want to prevent pregnancy. Osteoporosis and menopause Osteoporosis is a disease in which the bones lose minerals and strength with aging. This can result in bone fractures. If you are 4 years old or older, or if you are at risk for osteoporosis and fractures, ask your health care provider if you should: Be screened for bone loss. Take a calcium or vitamin D supplement to lower your risk of fractures. Be given hormone replacement therapy (HRT) to treat symptoms of menopause. Follow these instructions at home: Alcohol use Do not drink alcohol if: Your health care provider tells you not to drink. You are pregnant, may be pregnant, or are planning to become pregnant. If you drink alcohol: Limit how much you have to: 0-1 drink a day. Know how much alcohol is in your drink. In the [place] one drink equals one 12 oz bottle of beer (355 mL), one 5 oz glass of wine (148 mL), or one 1 oz glass of hard liquor (44 mL). Lifestyle Do not use any products that contain nicotine or tobacco. These products include cigarettes, chewing  tobacco, and vaping devices, such as e-cigarettes. If you need help quitting, ask your health care provider. Do not use street drugs. Do not share needles. Ask your health care provider for help if you need support or information about quitting drugs. General instructions Schedule regular health, dental, and eye exams. Stay current with your vaccines. Tell your health care provider if: You often feel depressed. You have ever been abused or do not feel safe at home. Summary Adopting a healthy lifestyle and getting preventive care are important in promoting health and wellness. Follow your health care provider's instructions about healthy diet, exercising, and getting tested or screened for diseases. Follow your health care provider's instructions on monitoring your cholesterol and blood pressure. This information is not intended to replace advice given to you by your health care provider. Make sure you discuss any questions you have with your health care provider. Document Revised: 10/28/2020 Document Reviewed: 10/28/2020 Elsevier Patient Education  [employer].

## 2021-12-30 NOTE — Assessment & Plan Note (Signed)
Non pharmacologic treatment recommended for now. Further recommendations will be given according to 10 years CVD risk score and lipid panel numbers. 

## 2022-01-14 ENCOUNTER — Encounter: Payer: Self-pay | Admitting: Gastroenterology

## 2022-01-28 ENCOUNTER — Other Ambulatory Visit (HOSPITAL_BASED_OUTPATIENT_CLINIC_OR_DEPARTMENT_OTHER): Payer: Self-pay

## 2022-01-28 MED ORDER — ZOSTER VAC RECOMB ADJUVANTED 50 MCG/0.5ML IM SUSR
INTRAMUSCULAR | 1 refills | Status: DC
  Filled 2022-01-28: qty 0.5, 1d supply, fill #0
  Filled 2022-08-28: qty 0.5, 1d supply, fill #1

## 2022-01-29 ENCOUNTER — Ambulatory Visit: Payer: No Typology Code available for payment source | Admitting: Physician Assistant

## 2022-02-17 ENCOUNTER — Ambulatory Visit: Payer: No Typology Code available for payment source | Admitting: Gastroenterology

## 2022-02-27 ENCOUNTER — Other Ambulatory Visit (HOSPITAL_BASED_OUTPATIENT_CLINIC_OR_DEPARTMENT_OTHER): Payer: Self-pay

## 2022-04-11 ENCOUNTER — Other Ambulatory Visit (HOSPITAL_BASED_OUTPATIENT_CLINIC_OR_DEPARTMENT_OTHER): Payer: Self-pay

## 2022-04-11 MED ORDER — FLUARIX QUADRIVALENT 0.5 ML IM SUSY
PREFILLED_SYRINGE | INTRAMUSCULAR | 0 refills | Status: DC
  Filled 2022-04-11: qty 0.5, 1d supply, fill #0

## 2022-04-17 ENCOUNTER — Other Ambulatory Visit (HOSPITAL_BASED_OUTPATIENT_CLINIC_OR_DEPARTMENT_OTHER): Payer: Self-pay

## 2022-04-18 ENCOUNTER — Other Ambulatory Visit (HOSPITAL_BASED_OUTPATIENT_CLINIC_OR_DEPARTMENT_OTHER): Payer: Self-pay

## 2022-05-29 ENCOUNTER — Other Ambulatory Visit (HOSPITAL_BASED_OUTPATIENT_CLINIC_OR_DEPARTMENT_OTHER): Payer: Self-pay

## 2022-06-29 ENCOUNTER — Other Ambulatory Visit: Payer: Self-pay

## 2022-06-29 DIAGNOSIS — I10 Essential (primary) hypertension: Secondary | ICD-10-CM

## 2022-07-02 ENCOUNTER — Other Ambulatory Visit (HOSPITAL_BASED_OUTPATIENT_CLINIC_OR_DEPARTMENT_OTHER): Payer: Self-pay

## 2022-07-28 ENCOUNTER — Encounter (HOSPITAL_BASED_OUTPATIENT_CLINIC_OR_DEPARTMENT_OTHER): Payer: Self-pay | Admitting: Obstetrics & Gynecology

## 2022-08-03 ENCOUNTER — Other Ambulatory Visit (HOSPITAL_BASED_OUTPATIENT_CLINIC_OR_DEPARTMENT_OTHER): Payer: Self-pay

## 2022-08-03 ENCOUNTER — Other Ambulatory Visit: Payer: Self-pay

## 2022-08-03 DIAGNOSIS — I10 Essential (primary) hypertension: Secondary | ICD-10-CM

## 2022-08-03 DIAGNOSIS — A630 Anogenital (venereal) warts: Secondary | ICD-10-CM | POA: Diagnosis not present

## 2022-08-03 DIAGNOSIS — D239 Other benign neoplasm of skin, unspecified: Secondary | ICD-10-CM | POA: Diagnosis not present

## 2022-08-03 MED ORDER — METOPROLOL SUCCINATE ER 50 MG PO TB24: 50.0000 mg | ORAL_TABLET | Freq: Every day | ORAL | 3 refills | Status: DC

## 2022-08-10 ENCOUNTER — Encounter: Payer: Self-pay | Admitting: *Deleted

## 2022-08-13 ENCOUNTER — Encounter (HOSPITAL_BASED_OUTPATIENT_CLINIC_OR_DEPARTMENT_OTHER): Payer: Self-pay | Admitting: Obstetrics & Gynecology

## 2022-08-13 ENCOUNTER — Other Ambulatory Visit (HOSPITAL_BASED_OUTPATIENT_CLINIC_OR_DEPARTMENT_OTHER): Payer: Self-pay

## 2022-08-13 ENCOUNTER — Ambulatory Visit (HOSPITAL_BASED_OUTPATIENT_CLINIC_OR_DEPARTMENT_OTHER): Payer: BC Managed Care – PPO | Admitting: Obstetrics & Gynecology

## 2022-08-13 VITALS — BP 152/90 | HR 69 | Ht 65.0 in | Wt 241.6 lb

## 2022-08-13 DIAGNOSIS — I10 Essential (primary) hypertension: Secondary | ICD-10-CM

## 2022-08-13 DIAGNOSIS — R635 Abnormal weight gain: Secondary | ICD-10-CM

## 2022-08-13 DIAGNOSIS — N951 Menopausal and female climacteric states: Secondary | ICD-10-CM

## 2022-08-13 MED ORDER — PROGESTERONE MICRONIZED 100 MG PO CAPS
ORAL_CAPSULE | ORAL | 12 refills | Status: DC
  Filled 2022-08-13: qty 42, 84d supply, fill #0

## 2022-08-13 MED ORDER — ESTRADIOL 0.5 MG PO TABS
0.5000 mg | ORAL_TABLET | Freq: Every day | ORAL | 2 refills | Status: DC
  Filled 2022-08-13: qty 90, 90d supply, fill #0

## 2022-08-13 NOTE — Progress Notes (Signed)
GYNECOLOGY  VISIT  CC:   menopausal symptoms  HPI: 57 y.o. G0P0000 Widowed White or Caucasian female here for concerns about weight gain that she feels is related   She is also having night sweats and heat intolerance during the days.  She also can feel quite cold at times.  She hasn't had any bleeding in two years.  Last MMG was 09/2015.  Would like to consider HRT.  H/o migraines but hasn't had any issues with this in the last two year.  Risks reviewed.  Possibility of having bleeding discussed.  Will start 1/2 dosing with 14 days each month progesterone use.  If no bleeding in 3 months, could use combination HRT.  Advised to also let me know if starts having headaches again.    Pt aware BP elevated.  Is having stressors with 28 yo mother including this morning.  On Losartan and metoprolol.  Does see Dr. [place] and checks BPs at home.  Reports they are much better than here.     Past Medical History:  Diagnosis Date   Adopted    H/O LEEP 1990   Hypertension    Migraines     MEDS:   Current Outpatient Medications on File Prior to Visit  Medication Sig Dispense Refill   influenza vac split quadrivalent PF (FLUARIX QUADRIVALENT) 0.5 ML injection Inject into the muscle. 0.5 mL 0   losartan (COZAAR) 25 MG tablet Take 1 tablet (25 mg total) by mouth daily. 90 tablet 3   metoprolol succinate (TOPROL-XL) 50 MG 24 hr tablet TAKE 1 TABLET (50 MG TOTAL) BY MOUTH DAILY. TAKE WITH OR IMMEDIATELY FOLLOWING A MEAL. 90 tablet 3   omeprazole (PRILOSEC) 20 MG capsule Take 1 capsule (20 mg total) by mouth as needed. 30 capsule 3   SUMAtriptan (IMITREX) 100 MG tablet Take 1 tablet (100 mg total) by mouth once as needed for up to 1 dose for migraine. May repeat in 2 hours if headache persists or recurs. 9 tablet 11   Zoster Vaccine Adjuvanted [facility]) injection Inject into the muscle. 0.5 mL 1   No current facility-administered medications on file prior to visit.    ALLERGIES: Nickel and Strawberry  extract  SH:  widowed, non smoker  Review of Systems  Constitutional: Negative.   Genitourinary: Negative.     PHYSICAL EXAMINATION:    BP (!) 152/90   Pulse 69   Ht 5' 5"$  (1.651 m) Comment: Reported  Wt 241 lb 9.6 oz (109.6 kg)   BMI 40.20 kg/m     General appearance: alert, cooperative and appears stated age No other exam performed today  Assessment/Plan: 1. Menopausal symptoms - will start HRT today.  Will start half dose for estradiol.  Dosing and route of administration discussed.  She is in the pool with work several times a week.  Will plan to proceed with  - estradiol (ESTRACE) 0.5 MG tablet; Take 1 tablet (0.5 mg total) by mouth daily.  Dispense: 30 tablet; Refill: 2 - progesterone (PROMETRIUM) 100 MG capsule; Take 1 capsule nightly days 1-14 each month  Dispense: 15 capsule; Refill: 12  2. Weight gain  3. Essential hypertension - she is going to keep watching blood pressures at home.  Was out of medication for a few days due to issues with [facility] shipping medication to her.

## 2022-08-28 ENCOUNTER — Other Ambulatory Visit (HOSPITAL_BASED_OUTPATIENT_CLINIC_OR_DEPARTMENT_OTHER): Payer: Self-pay

## 2022-09-02 ENCOUNTER — Other Ambulatory Visit: Payer: Self-pay

## 2022-09-02 DIAGNOSIS — I10 Essential (primary) hypertension: Secondary | ICD-10-CM

## 2022-09-02 MED ORDER — LOSARTAN POTASSIUM 25 MG PO TABS: 25.0000 mg | ORAL_TABLET | Freq: Every day | ORAL | 3 refills | Status: DC

## 2022-09-07 DIAGNOSIS — A630 Anogenital (venereal) warts: Secondary | ICD-10-CM | POA: Diagnosis not present

## 2022-09-07 DIAGNOSIS — D239 Other benign neoplasm of skin, unspecified: Secondary | ICD-10-CM | POA: Diagnosis not present

## 2022-09-21 ENCOUNTER — Other Ambulatory Visit (HOSPITAL_BASED_OUTPATIENT_CLINIC_OR_DEPARTMENT_OTHER): Payer: Self-pay

## 2022-10-05 DIAGNOSIS — A630 Anogenital (venereal) warts: Secondary | ICD-10-CM | POA: Diagnosis not present

## 2022-10-15 ENCOUNTER — Other Ambulatory Visit (HOSPITAL_BASED_OUTPATIENT_CLINIC_OR_DEPARTMENT_OTHER): Payer: Self-pay

## 2022-10-22 ENCOUNTER — Other Ambulatory Visit (HOSPITAL_BASED_OUTPATIENT_CLINIC_OR_DEPARTMENT_OTHER): Payer: Self-pay

## 2022-10-23 ENCOUNTER — Other Ambulatory Visit (HOSPITAL_BASED_OUTPATIENT_CLINIC_OR_DEPARTMENT_OTHER): Payer: Self-pay

## 2022-10-26 ENCOUNTER — Other Ambulatory Visit (HOSPITAL_BASED_OUTPATIENT_CLINIC_OR_DEPARTMENT_OTHER): Payer: Self-pay

## 2022-10-26 ENCOUNTER — Ambulatory Visit (HOSPITAL_BASED_OUTPATIENT_CLINIC_OR_DEPARTMENT_OTHER): Payer: No Typology Code available for payment source | Admitting: Obstetrics & Gynecology

## 2022-10-28 DIAGNOSIS — L814 Other melanin hyperpigmentation: Secondary | ICD-10-CM | POA: Diagnosis not present

## 2022-10-28 DIAGNOSIS — L578 Other skin changes due to chronic exposure to nonionizing radiation: Secondary | ICD-10-CM | POA: Diagnosis not present

## 2022-10-28 DIAGNOSIS — L821 Other seborrheic keratosis: Secondary | ICD-10-CM | POA: Diagnosis not present

## 2022-10-28 DIAGNOSIS — D1801 Hemangioma of skin and subcutaneous tissue: Secondary | ICD-10-CM | POA: Diagnosis not present

## 2022-10-28 DIAGNOSIS — A630 Anogenital (venereal) warts: Secondary | ICD-10-CM | POA: Diagnosis not present

## 2022-10-29 ENCOUNTER — Other Ambulatory Visit (HOSPITAL_BASED_OUTPATIENT_CLINIC_OR_DEPARTMENT_OTHER): Payer: Self-pay

## 2022-11-05 ENCOUNTER — Ambulatory Visit (INDEPENDENT_AMBULATORY_CARE_PROVIDER_SITE_OTHER): Payer: BC Managed Care – PPO | Admitting: Obstetrics & Gynecology

## 2022-11-05 ENCOUNTER — Encounter (HOSPITAL_BASED_OUTPATIENT_CLINIC_OR_DEPARTMENT_OTHER): Payer: Self-pay | Admitting: Obstetrics & Gynecology

## 2022-11-05 ENCOUNTER — Other Ambulatory Visit (HOSPITAL_BASED_OUTPATIENT_CLINIC_OR_DEPARTMENT_OTHER): Payer: Self-pay

## 2022-11-05 VITALS — BP 138/77 | HR 70 | Ht 65.0 in | Wt 245.2 lb

## 2022-11-05 DIAGNOSIS — Z79890 Hormone replacement therapy (postmenopausal): Secondary | ICD-10-CM | POA: Diagnosis not present

## 2022-11-05 DIAGNOSIS — Z1389 Encounter for screening for other disorder: Secondary | ICD-10-CM

## 2022-11-05 DIAGNOSIS — Z1231 Encounter for screening mammogram for malignant neoplasm of breast: Secondary | ICD-10-CM | POA: Diagnosis not present

## 2022-11-05 DIAGNOSIS — N951 Menopausal and female climacteric states: Secondary | ICD-10-CM

## 2022-11-05 DIAGNOSIS — Z01419 Encounter for gynecological examination (general) (routine) without abnormal findings: Secondary | ICD-10-CM | POA: Diagnosis not present

## 2022-11-05 DIAGNOSIS — Z1211 Encounter for screening for malignant neoplasm of colon: Secondary | ICD-10-CM

## 2022-11-05 DIAGNOSIS — I10 Essential (primary) hypertension: Secondary | ICD-10-CM

## 2022-11-05 MED ORDER — PROGESTERONE MICRONIZED 100 MG PO CAPS
ORAL_CAPSULE | ORAL | 4 refills | Status: DC
  Filled 2022-11-05: qty 42, 84d supply, fill #0
  Filled 2023-02-09: qty 42, 84d supply, fill #1
  Filled 2023-05-11: qty 42, 84d supply, fill #2
  Filled 2023-10-13: qty 42, 84d supply, fill #3

## 2022-11-05 MED ORDER — ESTRADIOL 0.5 MG PO TABS
0.5000 mg | ORAL_TABLET | Freq: Every day | ORAL | 4 refills | Status: DC
  Filled 2022-11-05: qty 30, 30d supply, fill #0
  Filled 2022-12-08: qty 30, 30d supply, fill #1
  Filled 2023-01-09: qty 30, 30d supply, fill #2
  Filled 2023-02-09: qty 30, 30d supply, fill #3
  Filled 2023-03-10: qty 30, 30d supply, fill #4
  Filled 2023-04-13: qty 30, 30d supply, fill #5
  Filled 2023-05-11: qty 30, 30d supply, fill #6
  Filled 2023-06-08: qty 30, 30d supply, fill #7
  Filled 2023-07-14: qty 30, 30d supply, fill #8
  Filled 2023-08-16: qty 30, 30d supply, fill #9
  Filled 2023-09-16: qty 30, 30d supply, fill #10
  Filled 2023-10-13: qty 30, 30d supply, fill #11

## 2022-11-05 NOTE — Progress Notes (Signed)
57 y.o. G0P0000 Widowed White or Caucasian female here for annual exam.  She started HRT about three months ago.  Has not had any bleeding.  Discussed changing progesterone to daily  No LMP recorded. (Menstrual status: Other).          Sexually active: No.  The current method of family planning is post menopausal status.    Exercising: No.   Smoker:  no  Health Maintenance: Pap:  09/23/2020 Negative History of abnormal Pap:  no MMG:  10/03/2021 Negative Colonoscopy:  did at [facility].  Was due this year.  Requests referral to [facility]:   not indicated Screening Labs: 12/2021   reports that she has never smoked. She has never been exposed to tobacco smoke. She has never used smokeless tobacco. She reports that she does not drink alcohol and does not use drugs.  Past Medical History:  Diagnosis Date   Adopted    H/O LEEP 1990   Hypertension    Migraines     Past Surgical History:  Procedure Laterality Date   KNEE ARTHROSCOPY Left 2019   SHOULDER SURGERY      Current Outpatient Medications  Medication Sig Dispense Refill   influenza vac split quadrivalent PF (FLUARIX QUADRIVALENT) 0.5 ML injection Inject into the muscle. 0.5 mL 0   losartan (COZAAR) 25 MG tablet Take 1 tablet (25 mg total) by mouth daily. 90 tablet 3   metoprolol succinate (TOPROL-XL) 50 MG 24 hr tablet TAKE 1 TABLET (50 MG TOTAL) BY MOUTH DAILY. TAKE WITH OR IMMEDIATELY FOLLOWING A MEAL. 90 tablet 3   omeprazole (PRILOSEC) 20 MG capsule Take 1 capsule (20 mg total) by mouth as needed. 30 capsule 3   SUMAtriptan (IMITREX) 100 MG tablet Take 1 tablet (100 mg total) by mouth once as needed for up to 1 dose for migraine. May repeat in 2 hours if headache persists or recurs. 9 tablet 11   estradiol (ESTRACE) 0.5 MG tablet Take 1 tablet (0.5 mg total) by mouth daily. 90 tablet 4   progesterone (PROMETRIUM) 100 MG capsule Take 1 capsule nightly days 1-14 each month 90 capsule 4   No current  facility-administered medications for this visit.    Family History  Adopted: Yes    ROS: Constitutional: negative Genitourinary:negative  Exam:   BP 138/77 (BP Location: Right Arm, Patient Position: Sitting, Cuff Size: Large)   Pulse 70   Ht 5\' 5"  (1.651 m) Comment: Reported  Wt 245 lb 3.2 oz (111.2 kg)   BMI 40.80 kg/m   Height: 5\' 5"  (165.1 cm) (Reported)  General appearance: alert, cooperative and appears stated age Head: Normocephalic, without obvious abnormality, atraumatic Neck: no adenopathy, supple, symmetrical, trachea midline and thyroid normal to inspection and palpation Lungs: clear to auscultation bilaterally Breasts: normal appearance, no masses or tenderness Heart: regular rate and rhythm Abdomen: soft, non-tender; bowel sounds normal; no masses,  no organomegaly Extremities: extremities normal, atraumatic, no cyanosis or edema Skin: Skin color, texture, turgor normal. No rashes or lesions Lymph nodes: Cervical, supraclavicular, and axillary nodes normal. No abnormal inguinal nodes palpated Neurologic: Grossly normal   Pelvic: External genitalia:  no lesions              Urethra:  normal appearing urethra with no masses, tenderness or lesions              Bartholins and Skenes: normal                 Vagina:  normal appearing vagina with normal color and no discharge, no lesions              Cervix: no lesions              Pap taken: No. Bimanual Exam:  Uterus:  normal size, contour, position, consistency, mobility, non-tender              Adnexa: normal adnexa and no mass, fullness, tenderness               Rectovaginal: Confirms               Anus:  normal sphincter tone, no lesions  Chaperone, Ina Homes, CMA, was present for exam.  Assessment/Plan: 1. Well woman exam with routine gynecological exam - Pap smear neg with neg HR HPV 2022 - Mammogram ordered - Colonoscopy referral placed - Bone mineral density not indicated yet - lab work done with  PCP, Dr. [place] - vaccines reviewed/updated  2. Menopausal symptoms  3. Hormone replacement therapy (HRT) - estradiol (ESTRACE) 0.5 MG tablet; Take 1 tablet (0.5 mg total) by mouth daily.  Dispense: 90 tablet; Refill: 4 - progesterone (PROMETRIUM) 100 MG capsule; Take 1 capsule nightly days 1-14 each month  Dispense: 90 capsule; Refill: 4  4. Encounter for screening mammogram for malignant neoplasm of breast - MM 3D SCREENING MAMMOGRAM BILATERAL BREAST; Future  5. Colon cancer screening - Ambulatory referral to Gastroenterology  6. Essential hypertension

## 2022-12-08 ENCOUNTER — Other Ambulatory Visit (HOSPITAL_BASED_OUTPATIENT_CLINIC_OR_DEPARTMENT_OTHER): Payer: Self-pay

## 2023-02-09 ENCOUNTER — Other Ambulatory Visit (HOSPITAL_BASED_OUTPATIENT_CLINIC_OR_DEPARTMENT_OTHER): Payer: Self-pay

## 2023-03-02 ENCOUNTER — Telehealth: Payer: Self-pay

## 2023-03-02 DIAGNOSIS — E785 Hyperlipidemia, unspecified: Secondary | ICD-10-CM

## 2023-03-02 DIAGNOSIS — I10 Essential (primary) hypertension: Secondary | ICD-10-CM

## 2023-03-02 DIAGNOSIS — E559 Vitamin D deficiency, unspecified: Secondary | ICD-10-CM

## 2023-03-02 DIAGNOSIS — Z0189 Encounter for other specified special examinations: Secondary | ICD-10-CM

## 2023-03-02 NOTE — Telephone Encounter (Signed)
Patient would like her labs drawn prior to her CPE on 9/17, she would also like a cortisol level drawn. Okay to order? I pended them for you.

## 2023-03-03 NOTE — Addendum Note (Signed)
Addended by: Kathreen Devoid on: 03/03/2023 02:53 PM   Modules accepted: Orders

## 2023-03-03 NOTE — Telephone Encounter (Signed)
Labs in, just needs lab appt, thank you!

## 2023-03-04 ENCOUNTER — Other Ambulatory Visit (INDEPENDENT_AMBULATORY_CARE_PROVIDER_SITE_OTHER): Payer: BC Managed Care – PPO

## 2023-03-04 DIAGNOSIS — E785 Hyperlipidemia, unspecified: Secondary | ICD-10-CM

## 2023-03-04 DIAGNOSIS — E559 Vitamin D deficiency, unspecified: Secondary | ICD-10-CM | POA: Diagnosis not present

## 2023-03-04 DIAGNOSIS — I10 Essential (primary) hypertension: Secondary | ICD-10-CM

## 2023-03-04 LAB — LIPID PANEL
Cholesterol: 179 mg/dL (ref 0–200)
HDL: 40.8 mg/dL (ref >39.00)
LDL Cholesterol: 115 mg/dL — ABNORMAL HIGH (ref 0–99)
NonHDL: 137.94
Total CHOL/HDL Ratio: 4
Triglycerides: 116 mg/dL (ref 0.0–149.0)
VLDL: 23.2 mg/dL (ref 0.0–40.0)

## 2023-03-04 LAB — COMPREHENSIVE METABOLIC PANEL
ALT: 19 U/L (ref 0–35)
AST: 18 U/L (ref 0–37)
Albumin: 3.9 g/dL (ref 3.5–5.2)
Alkaline Phosphatase: 67 U/L (ref 39–117)
BUN: 18 mg/dL (ref 6–23)
CO2: 25 meq/L (ref 19–32)
Calcium: 9.1 mg/dL (ref 8.4–10.5)
Chloride: 108 meq/L (ref 96–112)
Creatinine, Ser: 0.83 mg/dL (ref 0.40–1.20)
GFR: 79.57 mL/min (ref >60.00)
Glucose, Bld: 92 mg/dL (ref 70–99)
Potassium: 4.3 meq/L (ref 3.5–5.1)
Sodium: 141 meq/L (ref 135–145)
Total Bilirubin: 0.5 mg/dL (ref 0.2–1.2)
Total Protein: 6.3 g/dL (ref 6.0–8.3)

## 2023-03-04 LAB — VITAMIN D 25 HYDROXY (VIT D DEFICIENCY, FRACTURES): VITD: 28.63 ng/mL — ABNORMAL LOW (ref 30.00–100.00)

## 2023-03-04 LAB — HEMOGLOBIN A1C: Hgb A1c MFr Bld: 5.3 % (ref 4.6–6.5)

## 2023-03-09 ENCOUNTER — Ambulatory Visit (INDEPENDENT_AMBULATORY_CARE_PROVIDER_SITE_OTHER): Payer: BC Managed Care – PPO | Admitting: Family Medicine

## 2023-03-09 ENCOUNTER — Encounter: Payer: Self-pay | Admitting: Family Medicine

## 2023-03-09 VITALS — BP 120/80 | HR 76 | Temp 98.7°F | Resp 12 | Ht 65.0 in | Wt 249.5 lb

## 2023-03-09 DIAGNOSIS — E785 Hyperlipidemia, unspecified: Secondary | ICD-10-CM | POA: Diagnosis not present

## 2023-03-09 DIAGNOSIS — E559 Vitamin D deficiency, unspecified: Secondary | ICD-10-CM

## 2023-03-09 DIAGNOSIS — I10 Essential (primary) hypertension: Secondary | ICD-10-CM

## 2023-03-09 DIAGNOSIS — Z1211 Encounter for screening for malignant neoplasm of colon: Secondary | ICD-10-CM | POA: Diagnosis not present

## 2023-03-09 DIAGNOSIS — Z6841 Body Mass Index (BMI) 40.0 and over, adult: Secondary | ICD-10-CM

## 2023-03-09 DIAGNOSIS — Z0000 Encounter for general adult medical examination without abnormal findings: Secondary | ICD-10-CM | POA: Diagnosis not present

## 2023-03-09 HISTORY — DX: Encounter for general adult medical examination without abnormal findings: Z00.00

## 2023-03-09 NOTE — Assessment & Plan Note (Signed)
Problem has been otherwise stable. Continue nonpharmacologic treatment. Follow-up in 1 year.

## 2023-03-09 NOTE — Assessment & Plan Note (Signed)
25 OH vitamin D is still mildly low,28.6 on 03/04/23, recommend increasing vitamin D supplementation from 5000 units to 10,000 units Tuesdays and Thursdays, continue rest of the days 5000 units. We will plan on rechecking 25 OH vitamin D in 1 year.

## 2023-03-09 NOTE — Patient Instructions (Addendum)
A few things to remember from today's visit:  Routine general medical examination at a health care facility  Colon cancer screening - Plan: Ambulatory referral to Gastroenterology  Essential hypertension  Hyperlipidemia, unspecified hyperlipidemia type  Vitamin D deficiency, unspecified Increase vit D from 1 cap to 2 caps Tuesday and Thursday.  If you need refills for medications you take chronically, please call your pharmacy. Do not use My Chart to request refills or for acute issues that need immediate attention. If you send a my chart message, it may take a few days to be addressed, specially if I am not in the office.  Please be sure medication list is accurate. If a new problem present, please set up appointment sooner than planned today.  Health Maintenance, Female Adopting a healthy lifestyle and getting preventive care are important in promoting health and wellness. Ask your health care provider about: The right schedule for you to have regular tests and exams. Things you can do on your own to prevent diseases and keep yourself healthy. What should I know about diet, weight, and exercise? Eat a healthy diet  Eat a diet that includes plenty of vegetables, fruits, low-fat dairy products, and lean protein. Do not eat a lot of foods that are high in solid fats, added sugars, or sodium. Maintain a healthy weight Body mass index (BMI) is used to identify weight problems. It estimates body fat based on height and weight. Your health care provider can help determine your BMI and help you achieve or maintain a healthy weight. Get regular exercise Get regular exercise. This is one of the most important things you can do for your health. Most adults should: Exercise for at least 150 minutes each week. The exercise should increase your heart rate and make you sweat (moderate-intensity exercise). Do strengthening exercises at least twice a week. This is in addition to the  moderate-intensity exercise. Spend less time sitting. Even light physical activity can be beneficial. Watch cholesterol and blood lipids Have your blood tested for lipids and cholesterol at 55 years of age, then have this test every 5 years. Have your cholesterol levels checked more often if: Your lipid or cholesterol levels are high. You are older than 55 years of age. You are at high risk for heart disease. What should I know about cancer screening? Depending on your health history and family history, you may need to have cancer screening at various ages. This may include screening for: Breast cancer. Cervical cancer. Colorectal cancer. Skin cancer. Lung cancer. What should I know about heart disease, diabetes, and high blood pressure? Blood pressure and heart disease High blood pressure causes heart disease and increases the risk of stroke. This is more likely to develop in people who have high blood pressure readings or are overweight. Have your blood pressure checked: Every 3-5 years if you are 38-36 years of age. Every year if you are 53 years old or older. Diabetes Have regular diabetes screenings. This checks your fasting blood sugar level. Have the screening done: Once every three years after age 41 if you are at a normal weight and have a low risk for diabetes. More often and at a younger age if you are overweight or have a high risk for diabetes. What should I know about preventing infection? Hepatitis B If you have a higher risk for hepatitis B, you should be screened for this virus. Talk with your health care provider to find out if you are at risk for hepatitis  B infection. Hepatitis C Testing is recommended for: Everyone born from [birth year] through 1963. Anyone with known risk factors for hepatitis C. Sexually transmitted infections (STIs) Get screened for STIs, including gonorrhea and chlamydia, if: You are sexually active and are younger than 55 years of age. You are  older than 55 years of age and your health care provider tells you that you are at risk for this type of infection. Your sexual activity has changed since you were last screened, and you are at increased risk for chlamydia or gonorrhea. Ask your health care provider if you are at risk. Ask your health care provider about whether you are at high risk for HIV. Your health care provider may recommend a prescription medicine to help prevent HIV infection. If you choose to take medicine to prevent HIV, you should first get tested for HIV. You should then be tested every 3 months for as long as you are taking the medicine. Pregnancy If you are about to stop having your period (premenopausal) and you may become pregnant, seek counseling before you get pregnant. Take 400 to 800 micrograms (mcg) of folic acid every day if you become pregnant. Ask for birth control (contraception) if you want to prevent pregnancy. Osteoporosis and menopause Osteoporosis is a disease in which the bones lose minerals and strength with aging. This can result in bone fractures. If you are 24 years old or older, or if you are at risk for osteoporosis and fractures, ask your health care provider if you should: Be screened for bone loss. Take a calcium or vitamin D supplement to lower your risk of fractures. Be given hormone replacement therapy (HRT) to treat symptoms of menopause. Follow these instructions at home: Alcohol use Do not drink alcohol if: Your health care provider tells you not to drink. You are pregnant, may be pregnant, or are planning to become pregnant. If you drink alcohol: Limit how much you have to: 0-1 drink a day. Know how much alcohol is in your drink. In the [place] one drink equals one 12 oz bottle of beer (355 mL), one 5 oz glass of wine (148 mL), or one 1 oz glass of hard liquor (44 mL). Lifestyle Do not use any products that contain nicotine or tobacco. These products include cigarettes, chewing  tobacco, and vaping devices, such as e-cigarettes. If you need help quitting, ask your health care provider. Do not use street drugs. Do not share needles. Ask your health care provider for help if you need support or information about quitting drugs. General instructions Schedule regular health, dental, and eye exams. Stay current with your vaccines. Tell your health care provider if: You often feel depressed. You have ever been abused or do not feel safe at home. Summary Adopting a healthy lifestyle and getting preventive care are important in promoting health and wellness. Follow your health care provider's instructions about healthy diet, exercising, and getting tested or screened for diseases. Follow your health care provider's instructions on monitoring your cholesterol and blood pressure. This information is not intended to replace advice given to you by your health care provider. Make sure you discuss any questions you have with your health care provider. Document Revised: 10/28/2020 Document Reviewed: 10/28/2020 Elsevier Patient Education  2024 [employer].

## 2023-03-09 NOTE — Progress Notes (Signed)
HPI: Amanda Mack is a 57 y.o. female with PMHx significant for HTN, HLD, and vitamin D deficiency, who is here today for her routine physical.  Last CPE: 12/30/2021 She sees gyn regularly, recently started on hormonal therapy.  Exercise: Walking 2+ miles at least 3x/wk. Teaches swimming as well.  Following a healthy diet: Eats out 3 nights/wk. Eats a salad for lunch every day from the same restaurant. Reports it is measured at 890 calories with dressing. Patient reports eating less than 2000 calories/day. She is concerned about wt gain since hormonal therapy started.  Sleeps in averaging 6-6.5 hours per night. Reports some nights are closer to 5 and some are higher.  Smoking: Never Alcohol consumption: Drinks wine approximately 3x/month  Dental: UTD on routine dental care. Recently completed Invisalign with orthodontist. Vision: UTD on routine visual care.  Immunization History  Administered Date(s) Administered   Influenza,inj,Quad PF,6+ Mos 04/18/2021, 04/11/2022   Influenza-Unspecified 04/04/2021   PFIZER(Purple Top)SARS-COV-2 Vaccination 03/29/2020, 04/19/2020   Td 01/01/2015   Zoster Recombinant(Shingrix) 01/28/2022, 08/28/2022   Health Maintenance  Topic Date Due   Colonoscopy  Never done   COVID-19 Vaccine (3 - Pfizer risk series) 05/17/2020   INFLUENZA VACCINE  09/20/2023 (Originally 01/21/2023)   HIV Screening  03/08/2028 (Originally 02/14/1981)   MAMMOGRAM  10/04/2023   DTaP/Tdap/Td (2 - Tdap) 12/31/2024   Cervical Cancer Screening (HPV/Pap Cotest)  09/23/2025   Hepatitis C Screening  Completed   Zoster Vaccines- Shingrix  Completed   HPV VACCINES  Aged Out   She had labs done recently.  Hypertension on Losartan 25mg /day and Metoprolol succinate 50mg /day BP readings at home: Not checking at home. Had them checked at orthodontist and dentist appointments.  Sinus tachycardia: She reports resting heart rate averaging 80s during the day and low 60s at night,  using watch to track Reports an arrhythmia (not during exercise) Reports she is not having palpitations as often as before, sx improved since last visit.   Lab Results  Component Value Date   CREATININE 0.83 03/04/2023   BUN 18 03/04/2023   NA 141 03/04/2023   K 4.3 03/04/2023   CL 108 03/04/2023   CO2 25 03/04/2023   Hyperlipidemia:On non pharmacologic treatment.  Lab Results  Component Value Date   CHOL 179 03/04/2023   HDL 40.80 03/04/2023   LDLCALC 115 (H) 03/04/2023   TRIG 116.0 03/04/2023   CHOLHDL 4 03/04/2023   Vitamin D Deficiency:  Taking supplement daily at night, 5000 U daily. Occasionally forgetting to take but not frequently.   Migraines:She reports no migraines for a long time. She is not taking IMITREX   GERD:She is taking omeprazole 20 mg daily as needed, occasionally.   Review of Systems  Constitutional:  Negative for appetite change and fever.  HENT:  Negative for dental problem, hearing loss, mouth sores, sore throat and trouble swallowing.   Eyes:  Negative for redness and visual disturbance.  Respiratory:  Negative for cough, shortness of breath and wheezing.   Cardiovascular:  Negative for chest pain and leg swelling.  Gastrointestinal:  Negative for abdominal pain, nausea and vomiting.       No changes in bowel habits.  Endocrine: Negative for cold intolerance, heat intolerance, polydipsia, polyphagia and polyuria.  Genitourinary:  Negative for decreased urine volume, dysuria and hematuria.  Musculoskeletal:  Negative for gait problem and myalgias.  Skin:  Negative for color change and rash.  Allergic/Immunologic: Negative for environmental allergies.  Neurological:  Negative for syncope, weakness  and headaches.  Hematological:  Negative for adenopathy. Does not bruise/bleed easily.  Psychiatric/Behavioral:  Negative for confusion. The patient is not nervous/anxious.   All other systems reviewed and are negative.  Current Outpatient Medications  on File Prior to Visit  Medication Sig Dispense Refill   estradiol (ESTRACE) 0.5 MG tablet Take 1 tablet (0.5 mg total) by mouth daily. 90 tablet 4   influenza vac split quadrivalent PF (FLUARIX QUADRIVALENT) 0.5 ML injection Inject into the muscle. 0.5 mL 0   losartan (COZAAR) 25 MG tablet Take 1 tablet (25 mg total) by mouth daily. 90 tablet 3   metoprolol succinate (TOPROL-XL) 50 MG 24 hr tablet TAKE 1 TABLET (50 MG TOTAL) BY MOUTH DAILY. TAKE WITH OR IMMEDIATELY FOLLOWING A MEAL. 90 tablet 3   omeprazole (PRILOSEC) 20 MG capsule Take 1 capsule (20 mg total) by mouth as needed. 30 capsule 3   progesterone (PROMETRIUM) 100 MG capsule Take 1 capsule by mouth nightly on days 1-14 of each month 90 capsule 4   SUMAtriptan (IMITREX) 100 MG tablet Take 1 tablet (100 mg total) by mouth once as needed for up to 1 dose for migraine. May repeat in 2 hours if headache persists or recurs. 9 tablet 11   No current facility-administered medications on file prior to visit.    Past Medical History:  Diagnosis Date   Adopted    Allergy    Arthritis    GERD (gastroesophageal reflux disease)    H/O LEEP 1990   Hypertension    Migraines    Routine general medical examination at a health care facility 03/09/2023    Past Surgical History:  Procedure Laterality Date   KNEE ARTHROSCOPY Left 2019   SHOULDER SURGERY      Allergies  Allergen Reactions   Nickel Rash   Strawberry Extract Anaphylaxis and Rash    Family History  Adopted: Yes    Social History   Socioeconomic History   Marital status: Widowed    Spouse name: Not on file   Number of children: Not on file   Years of education: Not on file   Highest education level: Bachelor's degree (e.g., BA, AB, BS)  Occupational History   Not on file  Tobacco Use   Smoking status: Never    Passive exposure: Never   Smokeless tobacco: Never  Vaping Use   Vaping status: Never Used  Substance and Sexual Activity   Alcohol use: No   Drug  use: No   Sexual activity: Not Currently    Birth control/protection: Pill  Other Topics Concern   Not on file  Social History Narrative   Not on file   Social Determinants of Health   Financial Resource Strain: Low Risk  (03/05/2023)   Overall Financial Resource Strain (CARDIA)    Difficulty of Paying Living Expenses: Not hard at all  Food Insecurity: No Food Insecurity (03/05/2023)   Hunger Vital Sign    Worried About Running Out of Food in the Last Year: Never true    Ran Out of Food in the Last Year: Never true  Transportation Needs: No Transportation Needs (03/05/2023)   PRAPARE - Administrator, Civil Service (Medical): No    Lack of Transportation (Non-Medical): No  Physical Activity: Sufficiently Active (03/05/2023)   Exercise Vital Sign    Days of Exercise per Week: 3 days    Minutes of Exercise per Session: 60 min  Stress: Stress Concern Present (03/05/2023)   [employer] of  Occupational Health - Occupational Stress Questionnaire    Feeling of Stress : To some extent  Social Connections: Moderately Integrated (03/05/2023)   Social Connection and Isolation Panel [NHANES]    Frequency of Communication with Friends and Family: More than three times a week    Frequency of Social Gatherings with Friends and Family: Three times a week    Attends Religious Services: More than 4 times per year    Active Member of Clubs or Organizations: Yes    Attends Banker Meetings: More than 4 times per year    Marital Status: Widowed   Vitals:   03/09/23 0849 03/09/23 0936  BP: 120/80   Pulse: (!) 105 76  Resp: 12   Temp: 98.7 F (37.1 C)   SpO2: 96%    Body mass index is 41.52 kg/m.  Wt Readings from Last 3 Encounters:  03/09/23 249 lb 8 oz (113.2 kg)  11/05/22 245 lb 3.2 oz (111.2 kg)  08/13/22 241 lb 9.6 oz (109.6 kg)   Physical Exam Vitals and nursing note reviewed.  Constitutional:      General: She is not in acute distress.    Appearance:  She is well-developed.  HENT:     Head: Normocephalic and atraumatic.     Right Ear: Tympanic membrane, ear canal and external ear normal.     Left Ear: Tympanic membrane, ear canal and external ear normal.     Mouth/Throat:     Mouth: Mucous membranes are moist.     Pharynx: Oropharynx is clear. Uvula midline.  Eyes:     Extraocular Movements: Extraocular movements intact.     Conjunctiva/sclera: Conjunctivae normal.     Pupils: Pupils are equal, round, and reactive to light.  Neck:     Thyroid: No thyromegaly.     Trachea: No tracheal deviation.  Cardiovascular:     Rate and Rhythm: Normal rate and regular rhythm.     Pulses:          Dorsalis pedis pulses are 2+ on the right side and 2+ on the left side.     Heart sounds: No murmur heard. Pulmonary:     Effort: Pulmonary effort is normal. No respiratory distress.     Breath sounds: Normal breath sounds.  Abdominal:     Palpations: Abdomen is soft. There is no hepatomegaly or mass.     Tenderness: There is no abdominal tenderness.  Genitourinary:    Comments: Deferred to gyn. Musculoskeletal:     Comments: No major deformity or signs of synovitis appreciated.  Lymphadenopathy:     Cervical: No cervical adenopathy.     Upper Body:     Right upper body: No supraclavicular adenopathy.     Left upper body: No supraclavicular adenopathy.  Skin:    General: Skin is warm.     Findings: No erythema or rash.  Neurological:     General: No focal deficit present.     Mental Status: She is alert and oriented to person, place, and time.     Cranial Nerves: No cranial nerve deficit.     Coordination: Coordination normal.     Gait: Gait normal.     Deep Tendon Reflexes:     Reflex Scores:      Bicep reflexes are 2+ on the right side and 2+ on the left side.      Patellar reflexes are 2+ on the right side and 2+ on the left side. Psychiatric:  Mood and Affect: Mood and affect normal.    ASSESSMENT AND PLAN: Ms. Kyra Sphar was here today annual physical examination.  Orders Placed This Encounter  Procedures   Ambulatory referral to Gastroenterology   Amb Ref to Medical Weight Management   Routine general medical examination at a health care facility Assessment & Plan: We discussed the importance of regular physical activity and healthy diet for prevention of chronic illness and/or complications. Preventive guidelines reviewed. Vaccination up-to-date. Ca++ and vit D supplementation recommended. Continue her female preventive care with Dr. Hyacinth Meeker. Next CPE in a year.   Colon cancer screening -     Ambulatory referral to Gastroenterology  Essential hypertension Assessment & Plan: BP adequately controlled. Continue metoprolol succinate 50 mg daily and losartan 25 mg daily. Recommend monitoring BP regularly. Continue annual follow-ups as well as home BP is at goal.   Hyperlipidemia, unspecified hyperlipidemia type Assessment & Plan: Problem has been otherwise stable. Continue nonpharmacologic treatment. Follow-up in 1 year.   Vitamin D deficiency, unspecified Assessment & Plan: 61 OH vitamin D is still mildly low,28.6 on 03/04/23, recommend increasing vitamin D supplementation from 5000 units to 10,000 units Tuesdays and Thursdays, continue rest of the days 5000 units. We will plan on rechecking 25 OH vitamin D in 1 year.   Class 3 severe obesity with serious comorbidity and body mass index (BMI) of 40.0 to 44.9 in adult, unspecified obesity type [facility]) Assessment & Plan: She understands the benefits of wt loss as well as adverse effects of obesity. She has gained some weight since hormonal therapy started. Consistency with healthy diet and physical activity encouraged. She would like to establish with healthy weight and wellness clinic, referral placed.  Orders: -     Amb Ref to Medical Weight Management   Return in 1 year (on 03/08/2024) for CPE.  I,Rachel Rivera,acting as  a scribe for Zooey Schreurs [place], MD.,have documented all relevant documentation on the behalf of Pratyush Ammon [place], MD,as directed by  Roma Bierlein [place], MD while in the presence of Emelyn Roen [place], MD.  I, Isabelle Course, have reviewed all documentation for this visit. The documentation on 03/09/23 for the exam, diagnosis, procedures, and orders are all accurate and complete.   Everette Dimauro G. [place], MD  [facility]. [place] office.

## 2023-03-09 NOTE — Assessment & Plan Note (Signed)
BP adequately controlled. Continue metoprolol succinate 50 mg daily and losartan 25 mg daily. Recommend monitoring BP regularly. Continue annual follow-ups as well as home BP is at goal.

## 2023-03-09 NOTE — Assessment & Plan Note (Signed)
She understands the benefits of wt loss as well as adverse effects of obesity. She has gained some weight since hormonal therapy started. Consistency with healthy diet and physical activity encouraged. She would like to establish with healthy weight and wellness clinic, referral placed.

## 2023-03-09 NOTE — Assessment & Plan Note (Signed)
We discussed the importance of regular physical activity and healthy diet for prevention of chronic illness and/or complications. Preventive guidelines reviewed. Vaccination up-to-date. Ca++ and vit D supplementation recommended. Continue her female preventive care with Dr. Hyacinth Meeker. Next CPE in a year.

## 2023-04-16 ENCOUNTER — Other Ambulatory Visit (HOSPITAL_BASED_OUTPATIENT_CLINIC_OR_DEPARTMENT_OTHER): Payer: Self-pay

## 2023-04-16 MED ORDER — INFLUENZA VIRUS VACC SPLIT PF (FLUZONE) 0.5 ML IM SUSY
0.5000 mL | PREFILLED_SYRINGE | Freq: Once | INTRAMUSCULAR | 0 refills | Status: AC
  Filled 2023-04-16: qty 0.5, 1d supply, fill #0

## 2023-04-30 ENCOUNTER — Other Ambulatory Visit (HOSPITAL_BASED_OUTPATIENT_CLINIC_OR_DEPARTMENT_OTHER): Payer: Self-pay

## 2023-04-30 DIAGNOSIS — M25561 Pain in right knee: Secondary | ICD-10-CM | POA: Diagnosis not present

## 2023-04-30 MED ORDER — MELOXICAM 15 MG PO TABS
15.0000 mg | ORAL_TABLET | Freq: Every day | ORAL | 2 refills | Status: AC
  Filled 2023-04-30: qty 30, 30d supply, fill #0
  Filled 2023-09-17: qty 30, 30d supply, fill #1

## 2023-05-11 ENCOUNTER — Other Ambulatory Visit (HOSPITAL_BASED_OUTPATIENT_CLINIC_OR_DEPARTMENT_OTHER): Payer: Self-pay

## 2023-05-11 ENCOUNTER — Other Ambulatory Visit: Payer: Self-pay

## 2023-06-08 ENCOUNTER — Other Ambulatory Visit (HOSPITAL_BASED_OUTPATIENT_CLINIC_OR_DEPARTMENT_OTHER): Payer: Self-pay

## 2023-06-13 IMAGING — MG MM DIGITAL SCREENING BILAT W/ TOMO AND CAD
6 of 12 series · 6 of 36 positions shown · non-contrast
Comparison: Previous exam(s).

CLINICAL DATA: Screening.

EXAM:
DIGITAL SCREENING BILATERAL MAMMOGRAM WITH TOMOSYNTHESIS AND CAD
TECHNIQUE: Bilateral screening digital craniocaudal and mediolateral oblique
mammograms were obtained. Bilateral screening digital breast
tomosynthesis was performed. The images were evaluated with
computer-aided detection.

[L MLO synth-2D (1 of 2)]
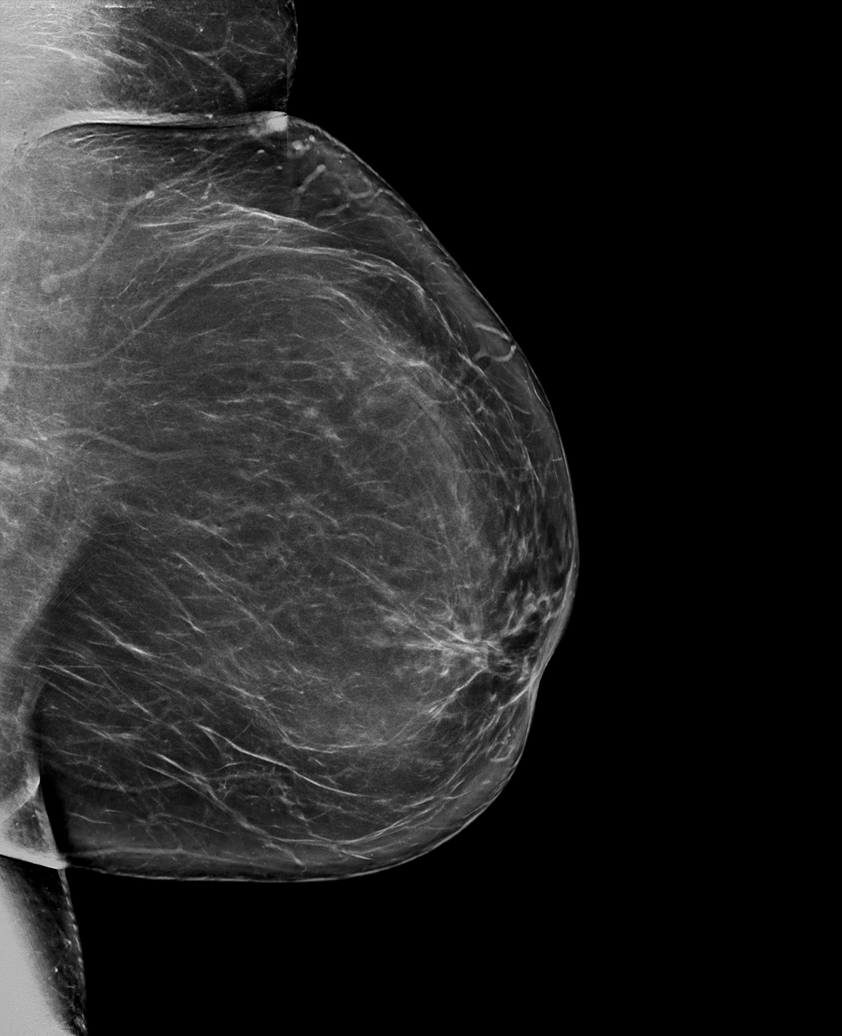

[R MLO synth-2D (1 of 2)]
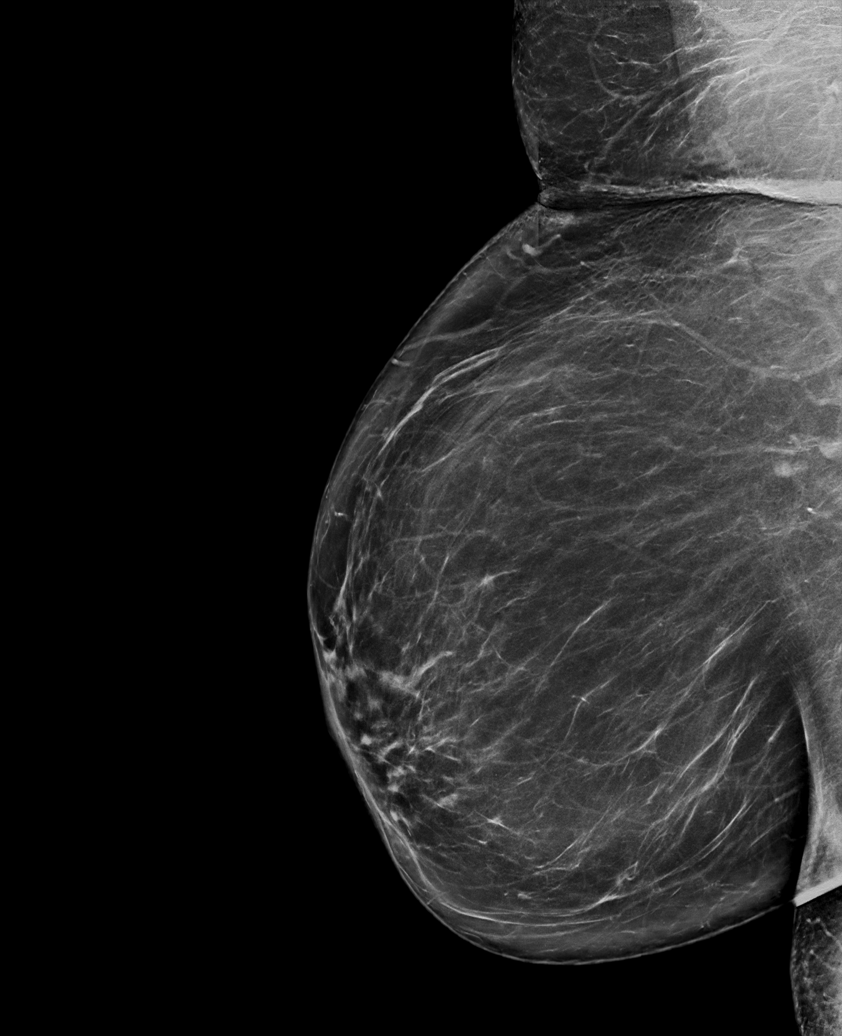

[R CC synth-2D]
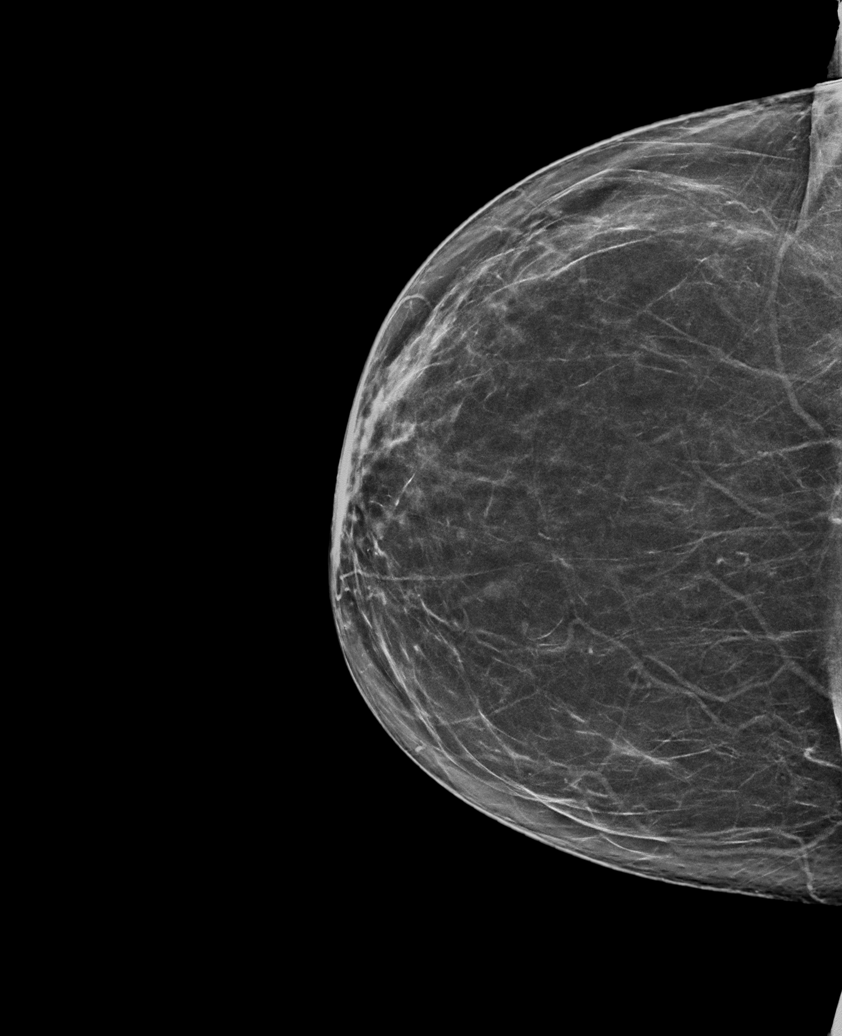

[L CC synth-2D]
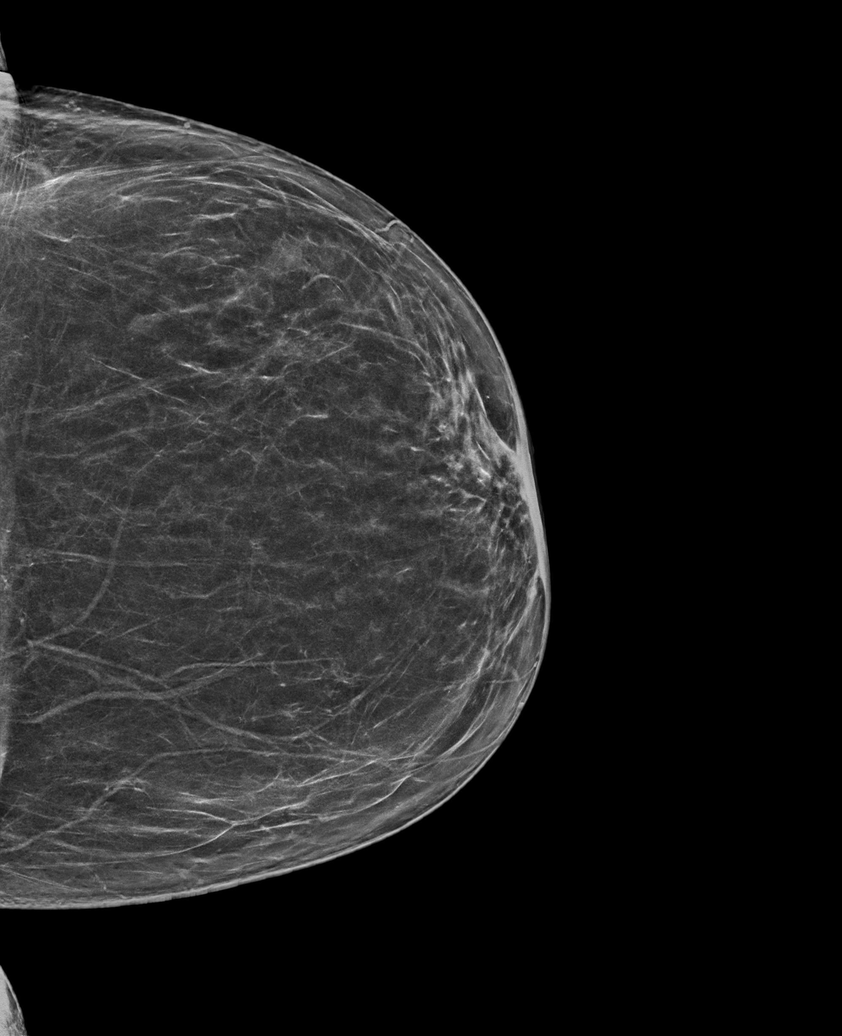

[R MLO synth-2D (2 of 2)]
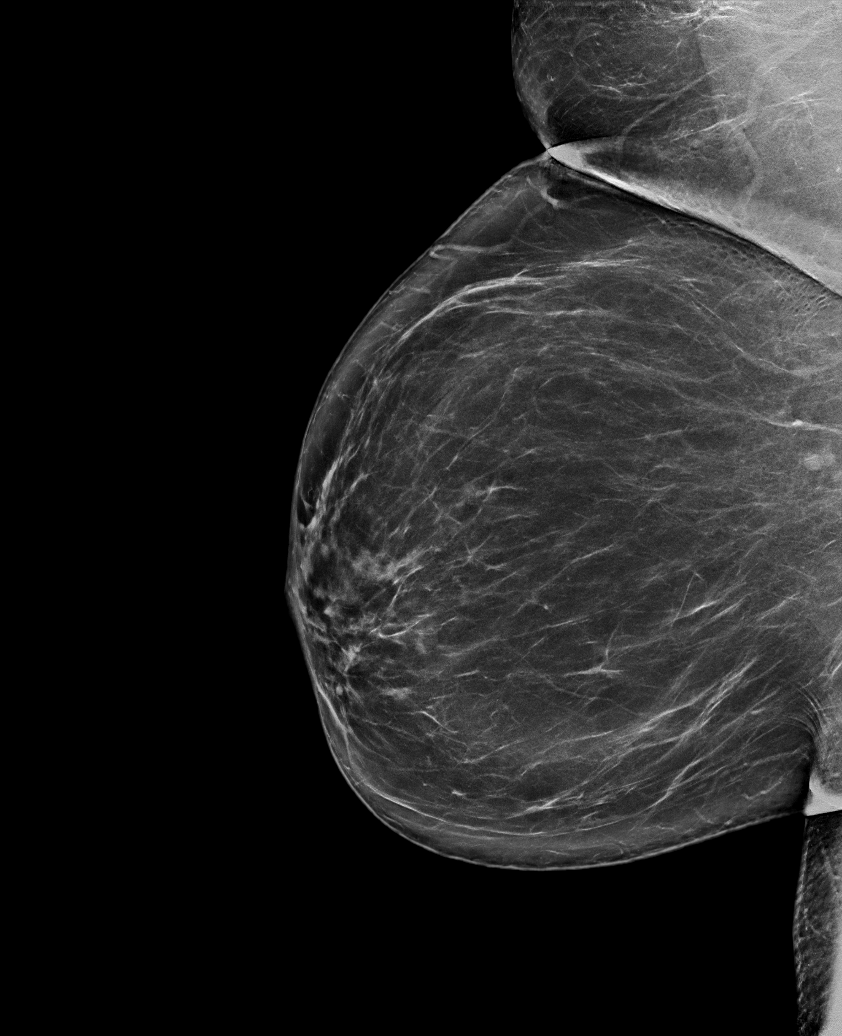

[L MLO synth-2D (2 of 2)]
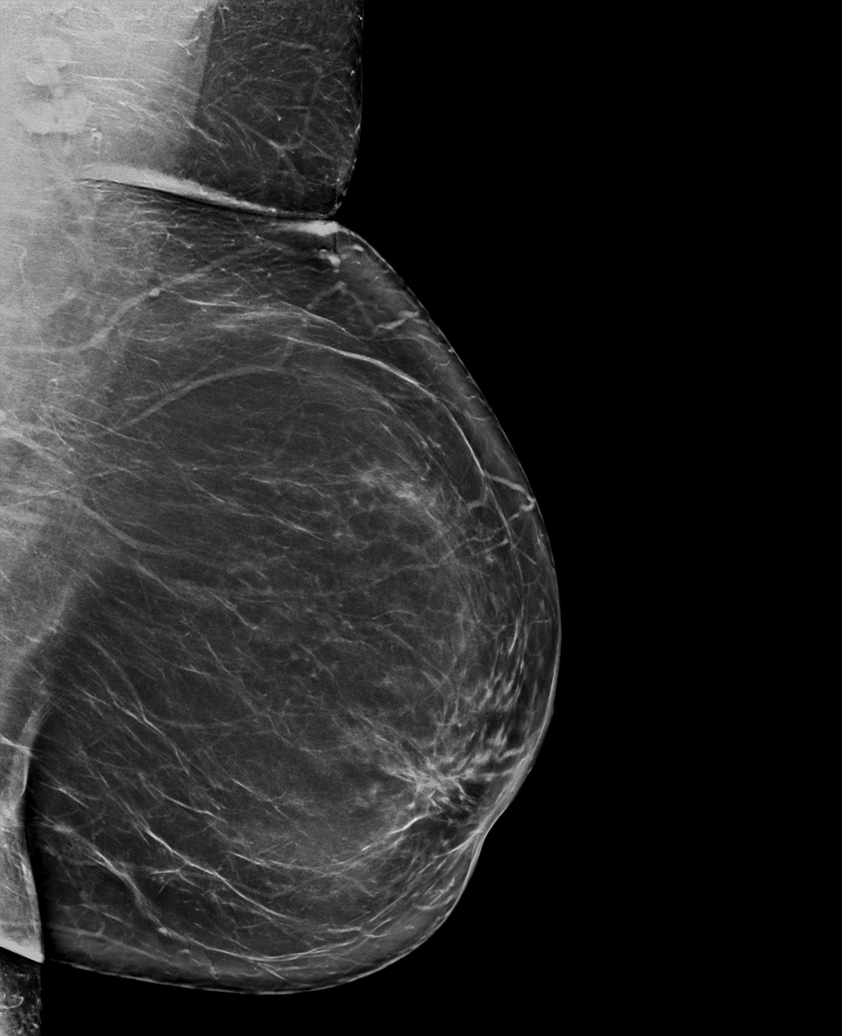

[6 of 36 positions shown; findings below may reference images not displayed]

ACR Breast Density Category b: There are scattered areas of
fibroglandular density.
FINDINGS: There are no findings suspicious for malignancy.
IMPRESSION: No mammographic evidence of malignancy. A result letter of this
screening mammogram will be mailed directly to the patient.

RECOMMENDATION:
Screening mammogram in one year. (Code:51-O-LD2)

BI-RADS CATEGORY  1: Negative.

## 2023-07-06 ENCOUNTER — Other Ambulatory Visit: Payer: Self-pay | Admitting: Family Medicine

## 2023-07-06 DIAGNOSIS — I10 Essential (primary) hypertension: Secondary | ICD-10-CM

## 2023-07-20 ENCOUNTER — Encounter (INDEPENDENT_AMBULATORY_CARE_PROVIDER_SITE_OTHER): Payer: BC Managed Care – PPO | Admitting: Physician Assistant

## 2023-08-02 ENCOUNTER — Encounter (INDEPENDENT_AMBULATORY_CARE_PROVIDER_SITE_OTHER): Payer: BC Managed Care – PPO | Admitting: Family Medicine

## 2023-08-02 ENCOUNTER — Encounter (INDEPENDENT_AMBULATORY_CARE_PROVIDER_SITE_OTHER): Payer: Self-pay

## 2023-08-16 ENCOUNTER — Other Ambulatory Visit (HOSPITAL_BASED_OUTPATIENT_CLINIC_OR_DEPARTMENT_OTHER): Payer: Self-pay

## 2023-09-09 ENCOUNTER — Other Ambulatory Visit: Payer: Self-pay | Admitting: Family Medicine

## 2023-09-09 ENCOUNTER — Other Ambulatory Visit (HOSPITAL_BASED_OUTPATIENT_CLINIC_OR_DEPARTMENT_OTHER): Payer: Self-pay

## 2023-09-09 DIAGNOSIS — I10 Essential (primary) hypertension: Secondary | ICD-10-CM

## 2023-09-09 NOTE — Telephone Encounter (Signed)
 Copied from CRM [id number]. Topic: Clinical - Medication Refill >> Sep 09, 2023  9:20 AM Isabell A wrote: Most Recent Primary Care Visit:  Provider: [place], BETTY G  Department: [facility]  Visit Type: PHYSICAL  Date: 03/09/2023  Medication: losartan (COZAAR) 25 MG tablet  Has the patient contacted their pharmacy? No (Agent: If no, request that the patient contact the pharmacy for the refill. If patient does not wish to contact the pharmacy document the reason why and proceed with request.) (Agent: If yes, when and what did the pharmacy advise?)  Is this the correct pharmacy for this prescription? Yes If no, delete pharmacy and type the correct one.  This is the patient's preferred pharmacy:  [facility] [address] [place] [address] Phone: [phone] Fax: [phone]  Has the prescription been filled recently? Yes  Is the patient out of the medication? No, 4 left - leaving town on Saturday  Has the patient been seen for an appointment in the last year OR does the patient have an upcoming appointment? Yes  Can we respond through MyChart? Yes  Agent: Please be advised that Rx refills may take up to 3 business days. We ask that you follow-up with your pharmacy.

## 2023-09-10 ENCOUNTER — Other Ambulatory Visit (HOSPITAL_BASED_OUTPATIENT_CLINIC_OR_DEPARTMENT_OTHER): Payer: Self-pay

## 2023-09-10 MED ORDER — LOSARTAN POTASSIUM 25 MG PO TABS
25.0000 mg | ORAL_TABLET | Freq: Every day | ORAL | 1 refills | Status: DC
Start: 2023-09-10 — End: 2024-03-23
  Filled 2023-09-10: qty 30, 30d supply, fill #0
  Filled 2023-10-13: qty 30, 30d supply, fill #1
  Filled 2023-12-17: qty 30, 30d supply, fill #2

## 2023-09-16 ENCOUNTER — Other Ambulatory Visit (HOSPITAL_BASED_OUTPATIENT_CLINIC_OR_DEPARTMENT_OTHER): Payer: Self-pay

## 2023-10-14 ENCOUNTER — Other Ambulatory Visit (HOSPITAL_BASED_OUTPATIENT_CLINIC_OR_DEPARTMENT_OTHER): Payer: Self-pay

## 2023-10-14 ENCOUNTER — Other Ambulatory Visit: Payer: Self-pay

## 2023-10-19 ENCOUNTER — Encounter: Payer: Self-pay | Admitting: Family Medicine

## 2023-10-20 ENCOUNTER — Encounter: Payer: Self-pay | Admitting: Pediatrics

## 2023-11-16 ENCOUNTER — Other Ambulatory Visit (HOSPITAL_BASED_OUTPATIENT_CLINIC_OR_DEPARTMENT_OTHER): Payer: Self-pay | Admitting: Obstetrics & Gynecology

## 2023-11-16 DIAGNOSIS — Z79890 Hormone replacement therapy (postmenopausal): Secondary | ICD-10-CM

## 2023-11-17 ENCOUNTER — Other Ambulatory Visit: Payer: Self-pay

## 2023-11-17 ENCOUNTER — Other Ambulatory Visit (HOSPITAL_BASED_OUTPATIENT_CLINIC_OR_DEPARTMENT_OTHER): Payer: Self-pay | Admitting: Obstetrics & Gynecology

## 2023-11-17 ENCOUNTER — Encounter (HOSPITAL_BASED_OUTPATIENT_CLINIC_OR_DEPARTMENT_OTHER): Payer: Self-pay | Admitting: Obstetrics & Gynecology

## 2023-11-17 ENCOUNTER — Other Ambulatory Visit (HOSPITAL_BASED_OUTPATIENT_CLINIC_OR_DEPARTMENT_OTHER): Payer: Self-pay

## 2023-11-17 DIAGNOSIS — Z79890 Hormone replacement therapy (postmenopausal): Secondary | ICD-10-CM

## 2023-11-17 MED ORDER — PROGESTERONE MICRONIZED 100 MG PO CAPS
ORAL_CAPSULE | ORAL | 0 refills | Status: DC
Start: 2023-11-17 — End: 2024-01-13
  Filled 2023-11-17: qty 90, fill #0

## 2023-11-17 MED ORDER — ESTRADIOL 0.5 MG PO TABS
0.5000 mg | ORAL_TABLET | Freq: Every day | ORAL | 0 refills | Status: DC
  Filled 2023-11-17: qty 30, 30d supply, fill #0
  Filled 2023-12-17: qty 30, 30d supply, fill #1

## 2023-11-26 ENCOUNTER — Ambulatory Visit

## 2023-11-26 ENCOUNTER — Other Ambulatory Visit (HOSPITAL_BASED_OUTPATIENT_CLINIC_OR_DEPARTMENT_OTHER): Payer: Self-pay

## 2023-11-26 VITALS — Ht 65.0 in | Wt 240.0 lb

## 2023-11-26 DIAGNOSIS — Z1211 Encounter for screening for malignant neoplasm of colon: Secondary | ICD-10-CM

## 2023-11-26 MED ORDER — NA SULFATE-K SULFATE-MG SULF 17.5-3.13-1.6 GM/177ML PO SOLN
1.0000 | Freq: Once | ORAL | 0 refills | Status: AC
  Filled 2023-11-26 – 2023-12-07 (×2): qty 354, 1d supply, fill #0

## 2023-11-26 NOTE — Progress Notes (Signed)

## 2023-12-03 ENCOUNTER — Encounter: Payer: Self-pay | Admitting: Pediatrics

## 2023-12-06 ENCOUNTER — Other Ambulatory Visit (HOSPITAL_BASED_OUTPATIENT_CLINIC_OR_DEPARTMENT_OTHER): Payer: Self-pay

## 2023-12-07 ENCOUNTER — Other Ambulatory Visit (HOSPITAL_BASED_OUTPATIENT_CLINIC_OR_DEPARTMENT_OTHER): Payer: Self-pay

## 2023-12-14 NOTE — Progress Notes (Signed)
 [facility] History and Physical   Primary Care Physician:  [place], Betty G, MD   Reason for Procedure:  Colorectal cancer screening  Plan:    Screening colonoscopy     HPI: Amanda Mack is a 58 y.o. female undergoing screening colonoscopy for colorectal cancer screening.  This is the patient's first colonoscopy.  Patient is adopted and therefore no family history is known regarding colorectal cancer.  Patient denies rectal bleeding or change in bowel habits at the time of this exam.   Past Medical History:  Diagnosis Date   Adopted    Allergy    Arthritis    GERD (gastroesophageal reflux disease)    H/O LEEP 1990   Hypertension    Migraines    Routine general medical examination at a health care facility 03/09/2023    Past Surgical History:  Procedure Laterality Date   KNEE ARTHROSCOPY Left 2019   SHOULDER SURGERY      Prior to Admission medications   Medication Sig Start Date End Date Taking? Authorizing Provider  estradiol  (ESTRACE ) 0.5 MG tablet Take 1 tablet (0.5 mg total) by mouth daily. 11/17/23   Cleotilde Ronal RAMAN, MD  influenza vac split quadrivalent PF (FLUARIX  QUADRIVALENT) 0.5 ML injection Inject into the muscle. 04/11/22   Luiz Channel, MD  losartan  (COZAAR ) 25 MG tablet Take 1 tablet (25 mg total) by mouth daily. 09/10/23   [place], Betty G, MD  meloxicam  (MOBIC ) 15 MG tablet Take 1 tablet (15 mg total) by mouth daily with a meal 04/30/23     metoprolol  succinate (TOPROL -XL) 50 MG 24 hr tablet TAKE 1 TABLET DAILY WITH OR IMMEDIATELY FOLLOWING A MEAL 07/06/23   [place], Betty G, MD  omeprazole  (PRILOSEC) 20 MG capsule Take 1 capsule (20 mg total) by mouth as needed. 10/20/21   Cleotilde Ronal RAMAN, MD  progesterone  (PROMETRIUM ) 100 MG capsule Take 1 capsule by mouth nightly on days 1-14 of each month. 11/17/23   Cleotilde Ronal RAMAN, MD  SUMAtriptan  (IMITREX ) 100 MG tablet Take 1 tablet (100 mg total) by mouth once as needed for up to 1 dose for migraine. May  repeat in 2 hours if headache persists or recurs. 09/23/20   Cleotilde Ronal RAMAN, MD    Current Outpatient Medications  Medication Sig Dispense Refill   estradiol  (ESTRACE ) 0.5 MG tablet Take 1 tablet (0.5 mg total) by mouth daily. 90 tablet 0   losartan  (COZAAR ) 25 MG tablet Take 1 tablet (25 mg total) by mouth daily. 90 tablet 1   metoprolol  succinate (TOPROL -XL) 50 MG 24 hr tablet TAKE 1 TABLET DAILY WITH OR IMMEDIATELY FOLLOWING A MEAL 90 tablet 3   meloxicam  (MOBIC ) 15 MG tablet Take 1 tablet (15 mg total) by mouth daily with a meal 30 tablet 2   omeprazole  (PRILOSEC) 20 MG capsule Take 1 capsule (20 mg total) by mouth as needed. 30 capsule 3   progesterone  (PROMETRIUM ) 100 MG capsule Take 1 capsule by mouth nightly on days 1-14 of each month. 90 capsule 0   SUMAtriptan  (IMITREX ) 100 MG tablet Take 1 tablet (100 mg total) by mouth once as needed for up to 1 dose for migraine. May repeat in 2 hours if headache persists or recurs. 9 tablet 11   Current Facility-Administered Medications  Medication Dose Route Frequency Provider Last Rate Last Admin   0.9 %  sodium chloride infusion  500 mL Intravenous Continuous [facility] [place], Inocente HERO, MD        Allergies as of 12/17/2023 -  Review Complete 12/17/2023  Allergen Reaction Noted   Nickel Rash 05/11/2011   Strawberry extract Anaphylaxis and Rash 05/11/2011    Family History  Adopted: Yes    Social History   Socioeconomic History   Marital status: Widowed    Spouse name: Not on file   Number of children: Not on file   Years of education: Not on file   Highest education level: Bachelor's degree (e.g., BA, AB, BS)  Occupational History   Not on file  Tobacco Use   Smoking status: Never    Passive exposure: Never   Smokeless tobacco: Never  Vaping Use   Vaping status: Never Used  Substance and Sexual Activity   Alcohol use: No   Drug use: No   Sexual activity: Not Currently    Birth control/protection: Pill  Other Topics Concern   Not  on file  Social History Narrative   Not on file   Social Drivers of Health   Financial Resource Strain: Low Risk  (03/05/2023)   Overall Financial Resource Strain (CARDIA)    Difficulty of Paying Living Expenses: Not hard at all  Food Insecurity: No Food Insecurity (03/05/2023)   Hunger Vital Sign    Worried About Running Out of Food in the Last Year: Never true    Ran Out of Food in the Last Year: Never true  Transportation Needs: No Transportation Needs (03/05/2023)   PRAPARE - Administrator, Civil Service (Medical): No    Lack of Transportation (Non-Medical): No  Physical Activity: Sufficiently Active (03/05/2023)   Exercise Vital Sign    Days of Exercise per Week: 3 days    Minutes of Exercise per Session: 60 min  Stress: Stress Concern Present (03/05/2023)   [employer] of Occupational Health - Occupational Stress Questionnaire    Feeling of Stress : To some extent  Social Connections: Moderately Integrated (03/05/2023)   Social Connection and Isolation Panel    Frequency of Communication with Friends and Family: More than three times a week    Frequency of Social Gatherings with Friends and Family: Three times a week    Attends Religious Services: More than 4 times per year    Active Member of Clubs or Organizations: Yes    Attends Banker Meetings: More than 4 times per year    Marital Status: Widowed  Intimate Partner Violence: Not on file    Review of Systems:  All other review of systems negative except as mentioned in the HPI.  Physical Exam: Vital signs BP (!) 152/90   Pulse 90   Temp (!) 87 F (30.6 C) (Temporal)   Ht 5' 5 (1.651 m)   Wt 240 lb (108.9 kg)   LMP  (LMP Unknown)   SpO2 95%   BMI 39.94 kg/m   General:   Alert,  Well-developed, well-nourished, pleasant and cooperative in NAD Airway:  Mallampati 3 Lungs:  Clear throughout to auscultation.   Heart:  Regular rate and rhythm; no murmurs, clicks, rubs,  or  gallops. Abdomen:  Soft, nontender and nondistended. Normal bowel sounds.   Neuro/Psych:  Normal mood and affect. A and O x 3  Inocente Hausen, MD [facility]

## 2023-12-17 ENCOUNTER — Encounter: Payer: Self-pay | Admitting: Pediatrics

## 2023-12-17 ENCOUNTER — Ambulatory Visit (AMBULATORY_SURGERY_CENTER): Admitting: Pediatrics

## 2023-12-17 VITALS — BP 159/72 | HR 82 | Temp 87.0°F | Resp 13 | Ht 65.0 in | Wt 240.0 lb

## 2023-12-17 DIAGNOSIS — D125 Benign neoplasm of sigmoid colon: Secondary | ICD-10-CM | POA: Diagnosis not present

## 2023-12-17 DIAGNOSIS — Z1211 Encounter for screening for malignant neoplasm of colon: Secondary | ICD-10-CM | POA: Diagnosis not present

## 2023-12-17 DIAGNOSIS — K6389 Other specified diseases of intestine: Secondary | ICD-10-CM | POA: Diagnosis not present

## 2023-12-17 DIAGNOSIS — K635 Polyp of colon: Secondary | ICD-10-CM

## 2023-12-17 DIAGNOSIS — K644 Residual hemorrhoidal skin tags: Secondary | ICD-10-CM

## 2023-12-17 DIAGNOSIS — K648 Other hemorrhoids: Secondary | ICD-10-CM

## 2023-12-17 DIAGNOSIS — D127 Benign neoplasm of rectosigmoid junction: Secondary | ICD-10-CM | POA: Diagnosis not present

## 2023-12-17 DIAGNOSIS — D122 Benign neoplasm of ascending colon: Secondary | ICD-10-CM | POA: Diagnosis not present

## 2023-12-17 DIAGNOSIS — D128 Benign neoplasm of rectum: Secondary | ICD-10-CM

## 2023-12-17 DIAGNOSIS — D123 Benign neoplasm of transverse colon: Secondary | ICD-10-CM | POA: Diagnosis not present

## 2023-12-17 MED ORDER — SODIUM CHLORIDE 0.9 % IV SOLN: 500.0000 mL | INTRAVENOUS | Status: AC

## 2023-12-17 NOTE — Progress Notes (Signed)
 9040 pt started wretching.  Sedation halted. Suctioning started. O2 to 15L. Zofran and Robinul given.  Pt allowed to wake up.  Pt reports no side effects.  Sats never dropped BS clear <50 cc in cannister some on pillow

## 2023-12-17 NOTE — Op Note (Signed)
 [facility] Patient Name: Amanda Mack Procedure Date: 12/17/2023 9:38 AM MRN: [id number] Endoscopist: Inocente Hausen , MD, [id number] Age: 58 Referring MD:  Date of Birth: 12/24/1965 Gender: Female Account #: [id number] Procedure:                Colonoscopy Indications:              Screening for colorectal malignant neoplasm,                            Patient reports a remote colonoscopy many years ago                            to investigate GI symptoms without significant                            findings Medicines:                Monitored Anesthesia Care Procedure:                Pre-Anesthesia Assessment:                           - Prior to the procedure, a History and Physical                            was performed, and patient medications and                            allergies were reviewed. The patient's tolerance of                            previous anesthesia was also reviewed. The risks                            and benefits of the procedure and the sedation                            options and risks were discussed with the patient.                            All questions were answered, and informed consent                            was obtained. Prior Anticoagulants: The patient has                            taken no anticoagulant or antiplatelet agents. ASA                            Grade Assessment: III - A patient with severe                            systemic disease. After reviewing the risks and  benefits, the patient was deemed in satisfactory                            condition to undergo the procedure.                           After obtaining informed consent, the colonoscope                            was passed under direct vision. Throughout the                            procedure, the patient's blood pressure, pulse, and                            oxygen saturations were monitored continuously. The                             CF [id number] #[id number] was introduced through the anus                            and advanced to the cecum, identified by                            appendiceal orifice and ileocecal valve. The                            colonoscopy was performed without difficulty. The                            patient tolerated the procedure well. The quality                            of the bowel preparation was good. The ileocecal                            valve, appendiceal orifice, and rectum were                            photographed. Scope In: 9:49:53 AM Scope Out: 10:12:32 AM Scope Withdrawal Time: 0 hours 19 minutes 45 seconds  Total Procedure Duration: 0 hours 22 minutes 39 seconds  Findings:                 Hemorrhoids were found on perianal exam.                           The digital rectal exam was normal. Pertinent                            negatives include normal sphincter tone and no                            palpable rectal lesions.  A 9 mm polyp was found in the proximal ascending                            colon. The polyp was sessile. The polyp was removed                            with a hot snare. Resection and retrieval were                            complete.                           A 4 mm polyp was found in the proximal ascending                            colon. The polyp was sessile. The polyp was removed                            with a cold biopsy forceps. Resection and retrieval                            were complete.                           A 6 mm polyp was found in the distal ascending                            colon. The polyp was sessile. The polyp was removed                            with a cold snare. Resection and retrieval were                            complete.                           Two sessile polyps were found in the rectum and                            transverse colon. The polyps were 3 to  4 mm in                            size. These polyps were removed with a cold biopsy                            forceps. Resection and retrieval were complete.                           A 5 mm polyp was found in the sigmoid colon. The                            polyp was sessile. The polyp was removed with a  cold snare. Resection and retrieval were complete.                           Internal hemorrhoids were found during retroflexion. Complications:            Patient started retching during procedure and                            anesthesia was ceased to limit aspiration.                            Estimated blood loss: Minimal. Estimated Blood Loss:     Estimated blood loss was minimal. Impression:               - Hemorrhoids found on perianal exam.                           - One 9 mm polyp in the proximal ascending colon,                            removed with a hot snare. Resected and retrieved.                           - One 4 mm polyp in the proximal ascending colon,                            removed with a cold biopsy forceps. Resected and                            retrieved.                           - One 6 mm polyp in the distal ascending colon,                            removed with a cold snare. Resected and retrieved.                           - Two 3 to 4 mm polyps in the rectum and in the                            transverse colon, removed with a cold biopsy                            forceps. Resected and retrieved.                           - One 5 mm polyp in the sigmoid colon, removed with                            a cold snare. Resected and retrieved.                           - Internal hemorrhoids. Recommendation:           -  Discharge patient to home (ambulatory).                           - Await pathology results.                           - Repeat colonoscopy for surveillance based on                            pathology  results.                           - The findings and recommendations were discussed                            with the patient's family.                           - Return to referring physician.                           - Patient has a contact number available for                            emergencies. The signs and symptoms of potential                            delayed complications were discussed with the                            patient. Return to normal activities tomorrow.                            Written discharge instructions were provided to the                            patient. Inocente Hausen, MD 12/17/2023 10:19:04 AM This report has been signed electronically.

## 2023-12-17 NOTE — Progress Notes (Signed)
 Called to room to assist during endoscopic procedure.  Patient ID and intended procedure confirmed with present staff. Received instructions for my participation in the procedure from the performing physician.

## 2023-12-17 NOTE — Progress Notes (Signed)
 Pt's BP elevated on discharge, MD aware and advised patient to keep a check on it today at home and if still elevated go ahead and take her scheduled metoprolol  and a 1/2 dose of losartan  and contact PCP.   Patient verbalizes understanding of the plan and no further questions.

## 2023-12-17 NOTE — Patient Instructions (Signed)

## 2023-12-20 ENCOUNTER — Telehealth: Payer: Self-pay

## 2023-12-20 NOTE — Telephone Encounter (Signed)
Attempted f/u call. NO answer, left VM.

## 2023-12-21 ENCOUNTER — Ambulatory Visit: Payer: Self-pay | Admitting: Pediatrics

## 2023-12-21 LAB — SURGICAL PATHOLOGY

## 2023-12-31 ENCOUNTER — Other Ambulatory Visit (HOSPITAL_BASED_OUTPATIENT_CLINIC_OR_DEPARTMENT_OTHER): Payer: Self-pay

## 2023-12-31 DIAGNOSIS — M25561 Pain in right knee: Secondary | ICD-10-CM | POA: Diagnosis not present

## 2023-12-31 MED ORDER — METHOCARBAMOL 500 MG PO TABS
500.0000 mg | ORAL_TABLET | Freq: Two times a day (BID) | ORAL | 0 refills | Status: DC
  Filled 2023-12-31: qty 14, 7d supply, fill #0

## 2023-12-31 MED ORDER — PREDNISONE 5 MG (21) PO TBPK
ORAL_TABLET | ORAL | 0 refills | Status: DC
  Filled 2023-12-31: qty 21, 6d supply, fill #0

## 2024-01-03 ENCOUNTER — Other Ambulatory Visit (HOSPITAL_BASED_OUTPATIENT_CLINIC_OR_DEPARTMENT_OTHER): Payer: Self-pay

## 2024-01-03 DIAGNOSIS — M79604 Pain in right leg: Secondary | ICD-10-CM | POA: Diagnosis not present

## 2024-01-03 DIAGNOSIS — M25561 Pain in right knee: Secondary | ICD-10-CM | POA: Diagnosis not present

## 2024-01-03 MED ORDER — PREDNISONE 5 MG (21) PO TBPK
ORAL_TABLET | ORAL | 0 refills | Status: DC
  Filled 2024-01-03: qty 21, 6d supply, fill #0

## 2024-01-04 ENCOUNTER — Other Ambulatory Visit (HOSPITAL_BASED_OUTPATIENT_CLINIC_OR_DEPARTMENT_OTHER): Payer: Self-pay

## 2024-01-05 NOTE — Therapy (Signed)
 OUTPATIENT PHYSICAL THERAPY LOWER EXTREMITY EVALUATION   Patient Name: Amanda Mack MRN: [id number] DOB:[birth year], 58 y.o., female Today's Date: 01/07/2024  END OF SESSION:  PT End of Session - 01/06/24 1117     Visit Number 1    Number of Visits 14    Date for PT Re-Evaluation 03/02/24    Authorization Type BCBS    PT Start Time 1111    PT Stop Time 1211    PT Time Calculation (min) 60 min    Activity Tolerance Patient tolerated treatment well    Behavior During Therapy WFL for tasks assessed/performed          Past Medical History:  Diagnosis Date   Adopted    Allergy    Arthritis    GERD (gastroesophageal reflux disease)    H/O LEEP 1990   Hypertension    Migraines    Routine general medical examination at a health care facility 03/09/2023   Past Surgical History:  Procedure Laterality Date   KNEE ARTHROSCOPY Left 2019   SHOULDER SURGERY     Patient Active Problem List   Diagnosis Date Noted   Routine general medical examination at a health care facility 03/09/2023   Menopausal symptoms 08/13/2022   Hyperlipidemia 09/23/2020   Vitamin D  deficiency, unspecified 03/01/2019   Sinus tachycardia 09/29/2016   Essential hypertension 04/17/2016   Class 3 severe obesity with serious comorbidity and body mass index (BMI) of 40.0 to 44.9 in adult 04/17/2016     REFERRING PROVIDER: Bissell, Jaclyn, PA-C  REFERRING DIAG: Pain in R lower limb, M79.604  THERAPY DIAG:  Pain in right thigh  Muscle weakness (generalized)  Difficulty in walking, not elsewhere classified  Stiffness of right knee, not elsewhere classified  Rationale for Evaluation and Treatment: Rehabilitation  ONSET DATE: 12/17/2023 and 01/02/2024  SUBJECTIVE:   SUBJECTIVE STATEMENT: Pt was moving a wood pile and had soreness in R HS on 12/17/2023.  She lifted up her bed and tried to move the rug with her leg.  She had increased pain the following AM.  Pt went to [facility] ortho and saw PA on  12/31/23.  Pt had x rays.  PA note indicated suspected strain of R biceps femoris.  Pt received crutches and PA prescribed prednisone  and robaxin .  He instructed her in using tylenol .  Pt also received home exercises including a standing stretch and supine heel slides.  She can't remember the 3rd exercise.  Pt states she felt better after using prednisone .    Pt was doing her exercises on 7/13 and felt and heard a pop when she stood up.  Pt had significant pain and felt unstable in knee.  She had increased pain with Wb'ing.  Pt saw PA on 01/03/24 and note indicated concern for a distal HS tear.  She was placed in a knee immobilizer and recommended to back of off stretching and activity.  PA ordered a MRI to rule out HS tear.  MRI is scheduled for 7/26 which pt may cancel if she is doing better.  She also refilled prednisone .   Pt states she started feeling better Tuesday and feels much better this week.  She is tolerating increased Wb'ing through R LE and is using a KI.  Her pain has improved though R LE feels weak.  She has pain with pushing foot in shoe.  Pt is limited with mobility, standing, and walking.  Pt unable to carry objects with walking.  She is very limited with household  chores.  Pt is extremely slow and careful with showering.  Pt has been using a shower chair.  Pt performs her stairs twice per day.         Pt was having knee pain from OA in November 2024 which is feeling fine now.  PERTINENT HISTORY: Bilat knee OA, L knee arthroscopy 2013 Hx of SI pain R Shoulder surgery HTN  PAIN:  NPRS:  0/10 current, > 10/10 worst Location:  distal R HS  PRECAUTIONS: Other: knee OA    WEIGHT BEARING RESTRICTIONS: No  FALLS:  Has patient fallen in last 6 months? 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   PLOF: Independent  PATIENT  GOALS: to remove crutches and walk normally   OBJECTIVE:  Note: Objective measures were completed at Evaluation unless otherwise noted.  DIAGNOSTIC FINDINGS: X rays per PA note:  tricompartmental degenerative changes present without acute displaced fracture or dislocation identified.  When compared to radiographs from Nov 2024, no significant changes identified.  PATIENT SURVEYS:  LEFS  Extreme difficulty/unable (0), Quite a bit of difficulty (1), Moderate difficulty (2), Little difficulty (3), No difficulty (4) Survey date:  01/06/24  Any of your usual work, housework or school activities 0  2. Usual hobbies, recreational or sporting activities 0  3. Getting into/out of the bath 3  4. Walking between rooms 3  5. Putting on socks/shoes 2  6. Squatting  0  7. Lifting an object, like a bag of groceries from the floor 0  8. Performing light activities around your home 0  9. Performing heavy activities around your home 0  10. Getting into/out of a car 2  11. Walking 2 blocks 0  12. Walking 1 mile 0  13. Going up/down 10 stairs (1 flight) 0  14. Standing for 1 hour 0  15.  sitting for 1 hour 0  16. Running on even ground 0  17. Running on uneven ground 0  18. Making sharp turns while running fast 0  19. Hopping  0  20. Rolling over in bed 4  Score total:  14/80     COGNITION: Overall cognitive status: Within functional limits for tasks assessed      EDEMA:  PT unable to view HS for any bruising due to pt's clothing.   PALPATION: TTP medial and lateral distal HS.   LOWER EXTREMITY ROM:   ROM Right eval Left Eval AROM  Hip flexion    Hip extension    Hip abduction    Hip adduction    Hip internal rotation    Hip external rotation    Knee flexion 109 PROM 129  Knee extension 0 deg in resting 7  Ankle dorsiflexion    Ankle plantarflexion    Ankle inversion    Ankle eversion     (Blank rows = not tested)  LOWER EXTREMITY MMT:  MMT Right eval Left eval  Hip  flexion    Hip extension    Hip abduction    Hip adduction    Hip internal rotation    Hip external rotation    Knee flexion    Knee extension    Ankle dorsiflexion    Ankle plantarflexion    Ankle inversion    Ankle eversion     (Blank rows = not tested)   GAIT: Comments: Pt ambulated with  bilat crutches with KI a step through gait pattern.  Pt ambulated without crutches with KI and felt some twinges with gait.                                                                                                                                TREATMENT:   Pt performed seated submaximal mid range HS isometric with 5 sec hold x 10 reps.  Pt denied any pain.  Pt received a HEP handout and was educated in correct form and appropriate frequency.  PT instructed pt to stop exercise if she has pain.  PATIENT EDUCATION:  Education details: relevant anatomy, rationale of interventions, POC, dx, objective findings, prognosis, gait, crutch usage, HEP, ice usage, and positioning.  PT instructed pt to not stretch HS. PT answered questions. Person educated: Patient Education method: Programmer, multimedia, Demonstration, Verbal cues, and Handouts Education comprehension: verbalized understanding, returned demonstration, verbal cues required, and needs further education  HOME EXERCISE PROGRAM: Seated mid range submaximal HS isometric once per day, 2 sets of 5-10 reps with 5 sec hold  ASSESSMENT:  CLINICAL IMPRESSION: Patient is a 58 y.o. female with a dx of pain in R lower limb with suspected HS strain.  Pt is in [place] and ambulating with bilat crutches.  She has been feeling better this week since her exacerbation on Sunday.  Pt states her leg felt unstable though this is improving  as she is able to increase her Wb'ing through her R LE in [place] with gait.  She reports having weakness in R LE.  Pt is limited and has difficulty with her self care activities and ADLs/IADLs.  Pt is limited with standing and her normal  functional mobility skills including walking and stairs.  Her pain is located in distal HS and she is tender to palpate medial and lateral distal HS.  Pt should benefit from skilled PT to address impairments and assist in returning pt to PLOF.   OBJECTIVE IMPAIRMENTS: Abnormal gait, decreased activity tolerance, decreased endurance, decreased mobility, difficulty walking, decreased ROM, decreased strength, hypomobility, increased fascial restrictions, impaired flexibility, and pain.   ACTIVITY LIMITATIONS: carrying, bending, standing, squatting, stairs, transfers, and locomotion level, dressing, bathing  PARTICIPATION LIMITATIONS: meal prep, cleaning, laundry, driving, shopping, and community activity  PERSONAL FACTORS: 1-2 comorbidities: bilat knee OA, Hx of SI pain are also affecting patient's functional outcome.   REHAB POTENTIAL: Good  CLINICAL DECISION MAKING: Stable/uncomplicated  EVALUATION COMPLEXITY: Low   GOALS  SHORT TERM GOALS:   Pt will be independent and compliant with HEP for improved pain, strength, ROM, and function.  Baseline: Goal status: INITIAL Target date:  01/27/2024  2.  Pt will wean out of KI and off crutches without adverse effects.   Baseline:  Goal status: INITIAL Target date:  01/27/2024  3.  Pt will report she is able to ambulate community distance without any feelings of instability.  Baseline:  Goal status: INITIAL Target date:  02/03/2024  4.  Pt will ambulate with a normalized heel to toe gait pattern without limping.  Baseline:  Goal status: INITIAL Target date:  02/10/2024  5.  Pt will be able to perform her self care activities and shower transfers without increased pain and difficulty.  Baseline:  Goal status: INITIAL Target date:  02/17/2024  6.  Pt will be able to tolerate exercises without adverse effects for improved strength and proprioception to assist with returning to PLOF.  Baseline:  Goal status: INITIAL Target date:   02/10/2024  LONG TERM GOALS: Target date: 03/02/2024  Pt will be able to perform stairs with a reciprocal gait with good control without difficulty.  Baseline:  Goal status: INITIAL  2.  Pt will be able to perform her household chores without significant pain and difficulty.  Baseline:  Goal status: INITIAL  3.  Pt will ambulate extended community distance without significant pain and difficulty.  Baseline:  Goal status: INITIAL  4.  Pt will demo at least a 4 to 4+/5 MMT strength in R HS for improved performance of functional mobility and tolerance of daily mobility.  Baseline:  Goal status: INITIAL     PLAN:  PT FREQUENCY: 1x/wk for 2-3 weeks and 2x/wk afterwards  PT DURATION: 8 weeks  PLANNED INTERVENTIONS: 97164- PT Re-evaluation, 97750- Physical Performance Testing, 97110-Therapeutic exercises, 97530- Therapeutic activity, [id number]- Neuromuscular re-education, 97535- Self Care, [id number]- Manual therapy, [phone]- Gait training, [phone]- Aquatic Therapy, [phone]- Electrical stimulation (unattended), [phone]- Electrical stimulation (manual), [id number]- Ultrasound, Patient/Family education, Balance training, Stair training, Taping, Joint mobilization, Spinal mobilization, DME instructions, Cryotherapy, and Moist heat  PLAN FOR NEXT SESSION: Cont per protocol, healing constrains, and sx's.  Gait training.  Ice as needed.   Leigh Minerva III PT, DPT 01/07/24 3:58 PM

## 2024-01-06 ENCOUNTER — Ambulatory Visit (HOSPITAL_BASED_OUTPATIENT_CLINIC_OR_DEPARTMENT_OTHER): Attending: Orthopedic Surgery | Admitting: Physical Therapy

## 2024-01-06 DIAGNOSIS — M6281 Muscle weakness (generalized): Secondary | ICD-10-CM | POA: Insufficient documentation

## 2024-01-06 DIAGNOSIS — M79651 Pain in right thigh: Secondary | ICD-10-CM | POA: Diagnosis not present

## 2024-01-06 DIAGNOSIS — M25661 Stiffness of right knee, not elsewhere classified: Secondary | ICD-10-CM | POA: Insufficient documentation

## 2024-01-06 DIAGNOSIS — R262 Difficulty in walking, not elsewhere classified: Secondary | ICD-10-CM | POA: Diagnosis not present

## 2024-01-07 ENCOUNTER — Encounter (HOSPITAL_BASED_OUTPATIENT_CLINIC_OR_DEPARTMENT_OTHER): Payer: Self-pay | Admitting: Physical Therapy

## 2024-01-07 ENCOUNTER — Other Ambulatory Visit: Payer: Self-pay

## 2024-01-12 ENCOUNTER — Ambulatory Visit (HOSPITAL_BASED_OUTPATIENT_CLINIC_OR_DEPARTMENT_OTHER)

## 2024-01-12 ENCOUNTER — Telehealth (HOSPITAL_BASED_OUTPATIENT_CLINIC_OR_DEPARTMENT_OTHER): Payer: Self-pay | Admitting: Obstetrics & Gynecology

## 2024-01-12 ENCOUNTER — Encounter (HOSPITAL_BASED_OUTPATIENT_CLINIC_OR_DEPARTMENT_OTHER): Payer: Self-pay

## 2024-01-12 DIAGNOSIS — M6281 Muscle weakness (generalized): Secondary | ICD-10-CM

## 2024-01-12 DIAGNOSIS — M79651 Pain in right thigh: Secondary | ICD-10-CM

## 2024-01-12 DIAGNOSIS — R262 Difficulty in walking, not elsewhere classified: Secondary | ICD-10-CM | POA: Diagnosis not present

## 2024-01-12 DIAGNOSIS — M25661 Stiffness of right knee, not elsewhere classified: Secondary | ICD-10-CM

## 2024-01-12 NOTE — Therapy (Signed)
 OUTPATIENT PHYSICAL THERAPY LOWER EXTREMITY EVALUATION   Patient Name: Amanda Mack MRN: [id number] DOB:1966/01/08, 58 y.o., female Today's Date: 01/12/2024  END OF SESSION:  PT End of Session - 01/12/24 1654     Visit Number 2    Number of Visits 14    Date for PT Re-Evaluation 03/02/24    Authorization Type [employer]    PT Start Time 1434    PT Stop Time 1515    PT Time Calculation (min) 41 min    Activity Tolerance Patient tolerated treatment well    Behavior During Therapy WFL for tasks assessed/performed           Past Medical History:  Diagnosis Date   Adopted    Allergy    Arthritis    GERD (gastroesophageal reflux disease)    H/O LEEP 1990   Hypertension    Migraines    Routine general medical examination at a health care facility 03/09/2023   Past Surgical History:  Procedure Laterality Date   KNEE ARTHROSCOPY Left 2019   SHOULDER SURGERY     Patient Active Problem List   Diagnosis Date Noted   Routine general medical examination at a health care facility 03/09/2023   Menopausal symptoms 08/13/2022   Hyperlipidemia 09/23/2020   Vitamin D  deficiency, unspecified 03/01/2019   Sinus tachycardia 09/29/2016   Essential hypertension 04/17/2016   Class 3 severe obesity with serious comorbidity and body mass index (BMI) of 40.0 to 44.9 in adult 04/17/2016     REFERRING PROVIDER: Bissell, Jaclyn, PA-C  REFERRING DIAG: Pain in R lower limb, M79.604  THERAPY DIAG:  Pain in right thigh  Muscle weakness (generalized)  Difficulty in walking, not elsewhere classified  Stiffness of right knee, not elsewhere classified  Rationale for Evaluation and Treatment: Rehabilitation  ONSET DATE: 12/17/2023 and 01/02/2024  SUBJECTIVE:   SUBJECTIVE STATEMENT:  Arrived with immobilizer and bilateral crutches. Can walk with single crutch for short distances. Unable to step up onto a stair without pain/instability. Pain has improved significantly. Does not drive  with immobilizer.  Eval: Pt was moving a wood pile and had soreness in R HS on 12/17/2023.  She lifted up her bed and tried to move the rug with her leg.  She had increased pain the following AM.  Pt went to [facility] ortho and saw PA on 12/31/23.  Pt had x rays.  PA note indicated suspected strain of R biceps femoris.  Pt received crutches and PA prescribed prednisone  and robaxin .  He instructed her in using tylenol .  Pt also received home exercises including a standing stretch and supine heel slides.  She can't remember the 3rd exercise.  Pt states she felt better after using prednisone .    Pt was doing her exercises on 7/13 and felt and heard a pop when she stood up.  Pt had significant pain and felt unstable in knee.  She had increased pain with Wb'ing.  Pt saw PA on 01/03/24 and note indicated concern for a distal HS tear.  She was placed in a knee immobilizer and recommended to back of off stretching and activity.  PA ordered a MRI to rule out HS tear.  MRI is scheduled for 7/26 which pt may cancel if she is doing better.  She also refilled prednisone .   Pt states she started feeling better Tuesday and feels much better this week.  She is tolerating increased Wb'ing through R LE and is using a KI.  Her pain has improved though R  LE feels weak.  She has pain with pushing foot in shoe.  Pt is limited with mobility, standing, and walking.  Pt unable to carry objects with walking.  She is very limited with household chores.  Pt is extremely slow and careful with showering.  Pt has been using a shower chair.  Pt performs her stairs twice per day.         Pt was having knee pain from OA in November 2024 which is feeling fine now.  PERTINENT HISTORY: Bilat knee OA, L knee arthroscopy 2013 Hx of SI pain R Shoulder surgery HTN  PAIN:  NPRS:  0/10 current, > 10/10 worst Location:  distal R HS  PRECAUTIONS: Other: knee OA    WEIGHT BEARING RESTRICTIONS: No  FALLS:  Has patient fallen in last 6  months? 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   PLOF: Independent  PATIENT GOALS: to remove crutches and walk normally   OBJECTIVE:  Note: Objective measures were completed at Evaluation unless otherwise noted.  DIAGNOSTIC FINDINGS: X rays per PA note:  tricompartmental degenerative changes present without acute displaced fracture or dislocation identified.  When compared to radiographs from Nov 2024, no significant changes identified.  PATIENT SURVEYS:  LEFS  Extreme difficulty/unable (0), Quite a bit of difficulty (1), Moderate difficulty (2), Little difficulty (3), No difficulty (4) Survey date:  01/06/24  Any of your usual work, housework or school activities 0  2. Usual hobbies, recreational or sporting activities 0  3. Getting into/out of the bath 3  4. Walking between rooms 3  5. Putting on socks/shoes 2  6. Squatting  0  7. Lifting an object, like a bag of groceries from the floor 0  8. Performing light activities around your home 0  9. Performing heavy activities around your home 0  10. Getting into/out of a car 2  11. Walking 2 blocks 0  12. Walking 1 mile 0  13. Going up/down 10 stairs (1 flight) 0  14. Standing for 1 hour 0  15.  sitting for 1 hour 0  16. Running on even ground 0  17. Running on uneven ground 0  18. Making sharp turns while running fast 0  19. Hopping  0  20. Rolling over in bed 4  Score total:  14/80     COGNITION: Overall cognitive status: Within functional limits for tasks assessed      EDEMA:  PT unable to view HS for any bruising due to pt's clothing.   PALPATION: TTP medial and lateral distal HS.   LOWER EXTREMITY ROM:   ROM Right eval Left Eval AROM  Hip flexion    Hip extension    Hip abduction    Hip adduction    Hip internal rotation    Hip external rotation     Knee flexion 109 PROM 129  Knee extension 0 deg in resting 7  Ankle dorsiflexion    Ankle plantarflexion    Ankle inversion    Ankle eversion     (Blank rows = not tested)  LOWER EXTREMITY MMT:  MMT Right eval Left eval  Hip flexion    Hip extension    Hip abduction    Hip adduction    Hip internal rotation    Hip external rotation    Knee flexion    Knee extension  Ankle dorsiflexion    Ankle plantarflexion    Ankle inversion    Ankle eversion     (Blank rows = not tested)   GAIT: Comments: Pt ambulated with bilat crutches with KI a step through gait pattern.  Pt ambulated without crutches with KI and felt some twinges with gait.                                                                                                                                TREATMENT:   7/23: STM to distal HS in prone (pilow under hips) Prone HSC x10 Prone hip extension x10ea Gait with bil crutches and immobilizer doffed 2x157ft Gait along rail with unilateral support x1 lap SKTC with leg on red physioball 2x10 Submax isometrics with leg on physioball 5 x10 Nu-step UE and LE L1 x56min   Eval: Pt performed seated submaximal mid range HS isometric with 5 sec hold x 10 reps.  Pt denied any pain.  Pt received a HEP handout and was educated in correct form and appropriate frequency.  PT instructed pt to stop exercise if she has pain.  PATIENT EDUCATION:  Education details: relevant anatomy, rationale of interventions, POC, dx, objective findings, prognosis, gait, crutch usage, HEP, ice usage, and positioning.  PT instructed pt to not stretch HS. PT answered questions. Person educated: Patient Education method: Explanation, Demonstration, Verbal cues, and Handouts Education comprehension: verbalized understanding, returned demonstration, verbal cues required, and needs further education  HOME EXERCISE PROGRAM: Seated mid range submaximal HS isometric once per day, 2 sets of 5-10  reps with 5 sec hold  ASSESSMENT:  CLINICAL IMPRESSION: Patient very tender to palpation of localized area in distal medial hamstrings.  Very gentle STM performed to this area.  Patient with good tolerance for gentle knee range of motion in addition to submax hamstring isometrics.  Patient does have history of bilateral SIJ issues to monitored this with exercises.  Worked on gait training without immobilizer. She did well with this using bilateral crutches and denied pain.  Able to trial NuStep in clinic for short duration without incidence of discomfort or pain.  Patient to continue with current HEP and bilateral pression/immobilizer until therapy can gauge response to progressions.  Eval: Patient is a 58 y.o. female with a dx of pain in R lower limb with suspected HS strain.  Pt is in [place] and ambulating with bilat crutches.  She has been feeling better this week since her exacerbation on Sunday.  Pt states her leg felt unstable though this is improving  as she is able to increase her Wb'ing through her R LE in [place] with gait.  She reports having weakness in R LE.  Pt is limited and has difficulty with her self care activities and ADLs/IADLs.  Pt is limited with standing and her normal functional mobility skills including walking and stairs.  Her pain is located in distal HS and she is tender to  palpate medial and lateral distal HS.  Pt should benefit from skilled PT to address impairments and assist in returning pt to PLOF.   OBJECTIVE IMPAIRMENTS: Abnormal gait, decreased activity tolerance, decreased endurance, decreased mobility, difficulty walking, decreased ROM, decreased strength, hypomobility, increased fascial restrictions, impaired flexibility, and pain.   ACTIVITY LIMITATIONS: carrying, bending, standing, squatting, stairs, transfers, and locomotion level, dressing, bathing  PARTICIPATION LIMITATIONS: meal prep, cleaning, laundry, driving, shopping, and community activity  PERSONAL FACTORS:  1-2 comorbidities: bilat knee OA, Hx of SI pain are also affecting patient's functional outcome.   REHAB POTENTIAL: Good  CLINICAL DECISION MAKING: Stable/uncomplicated  EVALUATION COMPLEXITY: Low   GOALS  SHORT TERM GOALS:   Pt will be independent and compliant with HEP for improved pain, strength, ROM, and function.  Baseline: Goal status: INITIAL Target date:  01/27/2024  2.  Pt will wean out of KI and off crutches without adverse effects.   Baseline:  Goal status: INITIAL Target date:  01/27/2024  3.  Pt will report she is able to ambulate community distance without any feelings of instability.  Baseline:  Goal status: INITIAL Target date: 02/03/2024  4.  Pt will ambulate with a normalized heel to toe gait pattern without limping.  Baseline:  Goal status: INITIAL Target date:  02/10/2024  5.  Pt will be able to perform her self care activities and shower transfers without increased pain and difficulty.  Baseline:  Goal status: INITIAL Target date:  02/17/2024  6.  Pt will be able to tolerate exercises without adverse effects for improved strength and proprioception to assist with returning to PLOF.  Baseline:  Goal status: INITIAL Target date:  02/10/2024  LONG TERM GOALS: Target date: 03/02/2024  Pt will be able to perform stairs with a reciprocal gait with good control without difficulty.  Baseline:  Goal status: INITIAL  2.  Pt will be able to perform her household chores without significant pain and difficulty.  Baseline:  Goal status: INITIAL  3.  Pt will ambulate extended community distance without significant pain and difficulty.  Baseline:  Goal status: INITIAL  4.  Pt will demo at least a 4 to 4+/5 MMT strength in R HS for improved performance of functional mobility and tolerance of daily mobility.  Baseline:  Goal status: INITIAL     PLAN:  PT FREQUENCY: 1x/wk for 2-3 weeks and 2x/wk afterwards  PT DURATION: 8 weeks  PLANNED INTERVENTIONS:  [id number]- PT Re-evaluation, 97750- Physical Performance Testing, 97110-Therapeutic exercises, 97530- Therapeutic activity, [id number]- Neuromuscular re-education, 97535- Self Care, [id number]- Manual therapy, [phone]- Gait training, [phone]- Aquatic Therapy, [phone]- Electrical stimulation (unattended), [phone]- Electrical stimulation (manual), [id number]- Ultrasound, Patient/Family education, Balance training, Stair training, Taping, Joint mobilization, Spinal mobilization, DME instructions, Cryotherapy, and Moist heat  PLAN FOR NEXT SESSION: Cont per protocol, healing constrains, and sx's.  Gait training.  Ice as needed.   Asberry Rodes, PTA  01/12/24 5:08 PM

## 2024-01-12 NOTE — Telephone Encounter (Signed)
 Patient is requesting prescription refill for: Estradiol  & Progesterone . TY

## 2024-01-13 ENCOUNTER — Other Ambulatory Visit (HOSPITAL_BASED_OUTPATIENT_CLINIC_OR_DEPARTMENT_OTHER): Payer: Self-pay | Admitting: Obstetrics & Gynecology

## 2024-01-13 ENCOUNTER — Other Ambulatory Visit (HOSPITAL_BASED_OUTPATIENT_CLINIC_OR_DEPARTMENT_OTHER): Payer: Self-pay

## 2024-01-13 DIAGNOSIS — Z79890 Hormone replacement therapy (postmenopausal): Secondary | ICD-10-CM

## 2024-01-13 MED ORDER — ESTRADIOL 0.5 MG PO TABS
0.5000 mg | ORAL_TABLET | Freq: Every day | ORAL | 0 refills | Status: DC
  Filled 2024-01-13: qty 30, 30d supply, fill #0

## 2024-01-13 MED ORDER — ESTRADIOL 0.5 MG PO TABS
0.5000 mg | ORAL_TABLET | Freq: Every day | ORAL | 0 refills | Status: DC
  Filled 2024-01-14: qty 30, 30d supply, fill #0

## 2024-01-13 MED ORDER — PROGESTERONE MICRONIZED 100 MG PO CAPS
ORAL_CAPSULE | ORAL | 0 refills | Status: DC
  Filled 2024-01-13: qty 42, 84d supply, fill #0

## 2024-01-13 MED ORDER — PROGESTERONE MICRONIZED 100 MG PO CAPS
ORAL_CAPSULE | ORAL | 0 refills | Status: DC
  Filled 2024-01-13: qty 45, fill #0
  Filled 2024-01-14: qty 15, 30d supply, fill #0

## 2024-01-14 ENCOUNTER — Other Ambulatory Visit (HOSPITAL_BASED_OUTPATIENT_CLINIC_OR_DEPARTMENT_OTHER): Payer: Self-pay

## 2024-01-14 ENCOUNTER — Encounter (HOSPITAL_BASED_OUTPATIENT_CLINIC_OR_DEPARTMENT_OTHER): Payer: Self-pay | Admitting: Radiology

## 2024-01-14 ENCOUNTER — Ambulatory Visit (HOSPITAL_BASED_OUTPATIENT_CLINIC_OR_DEPARTMENT_OTHER)
Admission: RE | Admit: 2024-01-14 | Discharge: 2024-01-14 | Disposition: A | Discharge status: Home / Self Care | Source: Ambulatory Visit | Attending: Obstetrics & Gynecology | Admitting: Obstetrics & Gynecology

## 2024-01-14 DIAGNOSIS — Z1231 Encounter for screening mammogram for malignant neoplasm of breast: Secondary | ICD-10-CM | POA: Diagnosis not present

## 2024-01-18 ENCOUNTER — Encounter (HOSPITAL_BASED_OUTPATIENT_CLINIC_OR_DEPARTMENT_OTHER): Payer: Self-pay

## 2024-01-18 ENCOUNTER — Ambulatory Visit (HOSPITAL_BASED_OUTPATIENT_CLINIC_OR_DEPARTMENT_OTHER)

## 2024-01-18 DIAGNOSIS — R262 Difficulty in walking, not elsewhere classified: Secondary | ICD-10-CM | POA: Diagnosis not present

## 2024-01-18 DIAGNOSIS — M6281 Muscle weakness (generalized): Secondary | ICD-10-CM

## 2024-01-18 DIAGNOSIS — M79651 Pain in right thigh: Secondary | ICD-10-CM | POA: Diagnosis not present

## 2024-01-18 DIAGNOSIS — M25661 Stiffness of right knee, not elsewhere classified: Secondary | ICD-10-CM

## 2024-01-18 NOTE — Therapy (Signed)
 OUTPATIENT PHYSICAL THERAPY LOWER EXTREMITY TREATMENT   Patient Name: Amanda Mack MRN: [id number] DOB:Nov 10, 1965, 58 y.o., female Today's Date: 01/18/2024  END OF SESSION:  PT End of Session - 01/18/24 1137     Visit Number 3    Number of Visits 14    Date for PT Re-Evaluation 03/02/24    Authorization Type [employer]    PT Start Time 1145    PT Stop Time 1230    PT Time Calculation (min) 45 min    Activity Tolerance Patient tolerated treatment well    Behavior During Therapy WFL for tasks assessed/performed            Past Medical History:  Diagnosis Date   Adopted    Allergy    Arthritis    GERD (gastroesophageal reflux disease)    H/O LEEP 1990   Hypertension    Migraines    Routine general medical examination at a health care facility 03/09/2023   Past Surgical History:  Procedure Laterality Date   KNEE ARTHROSCOPY Left 2019   SHOULDER SURGERY     Patient Active Problem List   Diagnosis Date Noted   Routine general medical examination at a health care facility 03/09/2023   Menopausal symptoms 08/13/2022   Hyperlipidemia 09/23/2020   Vitamin D  deficiency, unspecified 03/01/2019   Sinus tachycardia 09/29/2016   Essential hypertension 04/17/2016   Class 3 severe obesity with serious comorbidity and body mass index (BMI) of 40.0 to 44.9 in adult 04/17/2016     REFERRING PROVIDER: Bissell, Jaclyn, PA-C  REFERRING DIAG: Pain in R lower limb, M79.604  THERAPY DIAG:  Pain in right thigh  Muscle weakness (generalized)  Difficulty in walking, not elsewhere classified  Stiffness of right knee, not elsewhere classified  Rationale for Evaluation and Treatment: Rehabilitation  ONSET DATE: 12/17/2023 and 01/02/2024  SUBJECTIVE:   SUBJECTIVE STATEMENT:  Pt arrives with bilateral crutches. Using single crutch at home and in office. It does completely fine. No pain at rest. My knees just feel tight. Pt reports she stopped using immobilizer sa it was  bothering her.   Eval: Pt was moving a wood pile and had soreness in R HS on 12/17/2023.  She lifted up her bed and tried to move the rug with her leg.  She had increased pain the following AM.  Pt went to [facility] orth[facility] and saw PA on 12/31/23.  Pt had x rays.  PA note indicated suspected strain of R biceps femoris.  Pt received crutches and PA prescribed prednisone  and robaxin .  He instructed her in using tylenol .  Pt also received home exercises including a standing stretch and supine heel slides.  She can't remember the 3rd exercise.  Pt states she felt better after using prednisone .    Pt was doing her exercises on 7/13 and felt and heard a pop when she stood up.  Pt had significant pain and felt unstable in knee.  She had increased pain with Wb'ing.  Pt saw PA on 01/03/24 and note indicated concern for a distal HS tear.  She was placed in a knee immobilizer and recommended to back of off stretching and activity.  PA ordered a MRI to rule out HS tear.  MRI is scheduled for 7/26 which pt may cancel if she is doing better.  She also refilled prednisone .   Pt states she started feeling better Tuesday and feels much better this week.  She is tolerating increased Wb'ing through R LE and is using a KI.  Her pain has improved though R LE feels weak.  She has pain with pushing foot in shoe.  Pt is limited with mobility, standing, and walking.  Pt unable to carry objects with walking.  She is very limited with household chores.  Pt is extremely slow and careful with showering.  Pt has been using a shower chair.  Pt performs her stairs twice per day.         Pt was having knee pain from OA in November 2024 which is feeling fine now.  PERTINENT HISTORY: Bilat knee OA, L knee arthroscopy 2013 Hx of SI pain R Shoulder surgery HTN  PAIN:  NPRS:  0/10 current, > 10/10 worst Location:  distal R HS  PRECAUTIONS: Other: knee OA    WEIGHT BEARING RESTRICTIONS: No  FALLS:  Has patient fallen in last 6 months?  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   PLOF: Independent  PATIENT GOALS: to remove crutches and walk normally   OBJECTIVE:  Note: Objective measures were completed at Evaluation unless otherwise noted.  DIAGNOSTIC FINDINGS: X rays per PA note:  tricompartmental degenerative changes present without acute displaced fracture or dislocation identified.  When compared to radiographs from Nov 2024, no significant changes identified.  PATIENT SURVEYS:  LEFS  Extreme difficulty/unable (0), Quite a bit of difficulty (1), Moderate difficulty (2), Little difficulty (3), No difficulty (4) Survey date:  01/06/24  Any of your usual work, housework or school activities 0  2. Usual hobbies, recreational or sporting activities 0  3. Getting into/out of the bath 3  4. Walking between rooms 3  5. Putting on socks/shoes 2  6. Squatting  0  7. Lifting an object, like a bag of groceries from the floor 0  8. Performing light activities around your home 0  9. Performing heavy activities around your home 0  10. Getting into/out of a car 2  11. Walking 2 blocks 0  12. Walking 1 mile 0  13. Going up/down 10 stairs (1 flight) 0  14. Standing for 1 hour 0  15.  sitting for 1 hour 0  16. Running on even ground 0  17. Running on uneven ground 0  18. Making sharp turns while running fast 0  19. Hopping  0  20. Rolling over in bed 4  Score total:  14/80     COGNITION: Overall cognitive status: Within functional limits for tasks assessed      EDEMA:  PT unable to view HS for any bruising due to pt's clothing.   PALPATION: TTP medial and lateral distal HS.   LOWER EXTREMITY ROM:   ROM Right eval Left Eval AROM  Hip flexion    Hip extension    Hip abduction    Hip adduction    Hip internal rotation    Hip external rotation    Knee  flexion 109 PROM 129  Knee extension 0 deg in resting 7  Ankle dorsiflexion    Ankle plantarflexion    Ankle inversion    Ankle eversion     (Blank rows = not tested)  LOWER EXTREMITY MMT:  MMT Right eval Left eval  Hip flexion    Hip extension    Hip abduction    Hip adduction    Hip internal rotation    Hip external rotation    Knee  flexion    Knee extension    Ankle dorsiflexion    Ankle plantarflexion    Ankle inversion    Ankle eversion     (Blank rows = not tested)   GAIT: Comments: Pt ambulated with bilat crutches with KI a step through gait pattern.  Pt ambulated without crutches with KI and felt some twinges with gait.                                                                                                                                TREATMENT:    7/29: Nu-step UE and LE L5 x11min STM to distal HS in prone (pilow under hips) Prone HSC 2x10 Prone hip extension 2x10ea Long sit HSS 30sec x3R Gait with single crutch  2x117ft Standing HR 2x15 Standing hip 3way 2x10ea/bil Standing marching 2x10 Standing adductor stretch  7/23: STM to distal HS in prone (pilow under hips) Prone HSC x10 Prone hip extension x10ea Gait with bil crutches and immobilizer doffed 2x174ft Gait along rail with unilateral support x1 lap SKTC with leg on red physioball 2x10 Submax isometrics with leg on physioball 5 x10 Nu-step UE and LE L1 x7min   Eval: Pt performed seated submaximal mid range HS isometric with 5 sec hold x 10 reps.  Pt denied any pain.  Pt received a HEP handout and was educated in correct form and appropriate frequency.  PT instructed pt to stop exercise if she has pain.  PATIENT EDUCATION:  Education details: relevant anatomy, rationale of interventions, POC, dx, objective findings, prognosis, gait, crutch usage, HEP, ice usage, and positioning.  PT instructed pt to not stretch HS. PT answered questions. Person educated: Patient Education method:  Explanation, Demonstration, Verbal cues, and Handouts Education comprehension: verbalized understanding, returned demonstration, verbal cues required, and needs further education  HOME EXERCISE PROGRAM: Seated mid range submaximal HS isometric once per day, 2 sets of 5-10 reps with 5 sec hold  ASSESSMENT:  CLINICAL IMPRESSION: Pt remains tender to medial distal HS which is present bilaterally. Pt also tender more proximally and laterally. Spent time on STM on these areas to decrease restrictions. Pt demonstrates improved tolerance for ambulation with single crutch today compared to last session and had improved gait quality. Good tolerance for progressions to standing therex without c/o increased pain. Pt may get into water next week for class, but will refrain from exercises for now.   Eval: Patient is a 58 y.o. female with a dx of pain in R lower limb with suspected HS strain.  Pt is in [place] and ambulating with bilat crutches.  She has been feeling better this week since her exacerbation on Sunday.  Pt states her leg felt unstable though this is improving  as she is able to increase her Wb'ing through her R LE in [place] with gait.  She reports having weakness in R LE.  Pt is limited and has difficulty with her self care activities  and ADLs/IADLs.  Pt is limited with standing and her normal functional mobility skills including walking and stairs.  Her pain is located in distal HS and she is tender to palpate medial and lateral distal HS.  Pt should benefit from skilled PT to address impairments and assist in returning pt to PLOF.   OBJECTIVE IMPAIRMENTS: Abnormal gait, decreased activity tolerance, decreased endurance, decreased mobility, difficulty walking, decreased ROM, decreased strength, hypomobility, increased fascial restrictions, impaired flexibility, and pain.   ACTIVITY LIMITATIONS: carrying, bending, standing, squatting, stairs, transfers, and locomotion level, dressing,  bathing  PARTICIPATION LIMITATIONS: meal prep, cleaning, laundry, driving, shopping, and community activity  PERSONAL FACTORS: 1-2 comorbidities: bilat knee OA, Hx of SI pain are also affecting patient's functional outcome.   REHAB POTENTIAL: Good  CLINICAL DECISION MAKING: Stable/uncomplicated  EVALUATION COMPLEXITY: Low   GOALS  SHORT TERM GOALS:   Pt will be independent and compliant with HEP for improved pain, strength, ROM, and function.  Baseline: Goal status: INITIAL Target date:  01/27/2024  2.  Pt will wean out of KI and off crutches without adverse effects.   Baseline:  Goal status: INITIAL Target date:  01/27/2024  3.  Pt will report she is able to ambulate community distance without any feelings of instability.  Baseline:  Goal status: INITIAL Target date: 02/03/2024  4.  Pt will ambulate with a normalized heel to toe gait pattern without limping.  Baseline:  Goal status: INITIAL Target date:  02/10/2024  5.  Pt will be able to perform her self care activities and shower transfers without increased pain and difficulty.  Baseline:  Goal status: INITIAL Target date:  02/17/2024  6.  Pt will be able to tolerate exercises without adverse effects for improved strength and proprioception to assist with returning to PLOF.  Baseline:  Goal status: INITIAL Target date:  02/10/2024  LONG TERM GOALS: Target date: 03/02/2024  Pt will be able to perform stairs with a reciprocal gait with good control without difficulty.  Baseline:  Goal status: INITIAL  2.  Pt will be able to perform her household chores without significant pain and difficulty.  Baseline:  Goal status: INITIAL  3.  Pt will ambulate extended community distance without significant pain and difficulty.  Baseline:  Goal status: INITIAL  4.  Pt will demo at least a 4 to 4+/5 MMT strength in R HS for improved performance of functional mobility and tolerance of daily mobility.  Baseline:  Goal status:  INITIAL     PLAN:  PT FREQUENCY: 1x/wk for 2-3 weeks and 2x/wk afterwards  PT DURATION: 8 weeks  PLANNED INTERVENTIONS: 97164- PT Re-evaluation, 97750- Physical Performance Testing, 97110-Therapeutic exercises, 97530- Therapeutic activity, [id number]- Neuromuscular re-education, 97535- Self Care, [id number]- Manual therapy, [phone]- Gait training, [phone]- Aquatic Therapy, [phone]- Electrical stimulation (unattended), [phone]- Electrical stimulation (manual), [id number]- Ultrasound, Patient/Family education, Balance training, Stair training, Taping, Joint mobilization, Spinal mobilization, DME instructions, Cryotherapy, and Moist heat  PLAN FOR NEXT SESSION: Cont per protocol, healing constrains, and sx's.  Gait training.  Ice as needed.   Asberry Rodes, PTA  01/18/24 1:20 PM

## 2024-01-19 ENCOUNTER — Encounter (HOSPITAL_BASED_OUTPATIENT_CLINIC_OR_DEPARTMENT_OTHER): Admitting: Physical Therapy

## 2024-01-24 ENCOUNTER — Ambulatory Visit (HOSPITAL_BASED_OUTPATIENT_CLINIC_OR_DEPARTMENT_OTHER): Payer: Self-pay | Attending: Orthopedic Surgery

## 2024-01-24 DIAGNOSIS — M25661 Stiffness of right knee, not elsewhere classified: Secondary | ICD-10-CM | POA: Insufficient documentation

## 2024-01-24 DIAGNOSIS — M6281 Muscle weakness (generalized): Secondary | ICD-10-CM | POA: Insufficient documentation

## 2024-01-24 DIAGNOSIS — M79651 Pain in right thigh: Secondary | ICD-10-CM | POA: Diagnosis not present

## 2024-01-24 DIAGNOSIS — R262 Difficulty in walking, not elsewhere classified: Secondary | ICD-10-CM | POA: Diagnosis not present

## 2024-01-24 NOTE — Therapy (Signed)
 OUTPATIENT PHYSICAL THERAPY LOWER EXTREMITY TREATMENT   Patient Name: Amanda Mack MRN: [id number] DOB:08-26-1965, 58 y.o., female Today's Date: 01/24/2024  END OF SESSION:  PT End of Session - 01/24/24 1054     Visit Number 4    Number of Visits 14    Date for PT Re-Evaluation 03/02/24    Authorization Type [employer]    PT Start Time 1015    PT Stop Time 1100    PT Time Calculation (min) 45 min    Activity Tolerance Patient tolerated treatment well    Behavior During Therapy WFL for tasks assessed/performed             Past Medical History:  Diagnosis Date   Adopted    Allergy    Arthritis    GERD (gastroesophageal reflux disease)    H/O LEEP 1990   Hypertension    Migraines    Routine general medical examination at a health care facility 03/09/2023   Past Surgical History:  Procedure Laterality Date   KNEE ARTHROSCOPY Left 2019   SHOULDER SURGERY     Patient Active Problem List   Diagnosis Date Noted   Routine general medical examination at a health care facility 03/09/2023   Menopausal symptoms 08/13/2022   Hyperlipidemia 09/23/2020   Vitamin D  deficiency, unspecified 03/01/2019   Sinus tachycardia 09/29/2016   Essential hypertension 04/17/2016   Class 3 severe obesity with serious comorbidity and body mass index (BMI) of 40.0 to 44.9 in adult 04/17/2016     REFERRING PROVIDER: Bissell, Jaclyn, PA-C  REFERRING DIAG: Pain in R lower limb, M79.604  THERAPY DIAG:  Pain in right thigh  Muscle weakness (generalized)  Difficulty in walking, not elsewhere classified  Stiffness of right knee, not elsewhere classified  Rationale for Evaluation and Treatment: Rehabilitation  ONSET DATE: 12/17/2023 and 01/02/2024  SUBJECTIVE:   SUBJECTIVE STATEMENT:  Pt arrives with bilateral crutches. Pt reports she used Voltaren gel on her knee which helped a lot with the tightness. Pt reports she does well with single crutch. Leg gets tired when standing on it.  Plans to get into pool tomorrow. No pain at entry.   Eval: Pt was moving a wood pile and had soreness in R HS on 12/17/2023.  She lifted up her bed and tried to move the rug with her leg.  She had increased pain the following AM.  Pt went to [facility] ortho and saw PA on 12/31/23.  Pt had x rays.  PA note indicated suspected strain of R biceps femoris.  Pt received crutches and PA prescribed prednisone  and robaxin .  He instructed her in using tylenol .  Pt also received home exercises including a standing stretch and supine heel slides.  She can't remember the 3rd exercise.  Pt states she felt better after using prednisone .    Pt was doing her exercises on 7/13 and felt and heard a pop when she stood up.  Pt had significant pain and felt unstable in knee.  She had increased pain with Wb'ing.  Pt saw PA on 01/03/24 and note indicated concern for a distal HS tear.  She was placed in a knee immobilizer and recommended to back of off stretching and activity.  PA ordered a MRI to rule out HS tear.  MRI is scheduled for 7/26 which pt may cancel if she is doing better.  She also refilled prednisone .   Pt states she started feeling better Tuesday and feels much better this week.  She is tolerating  increased Wb'ing through R LE and is using a KI.  Her pain has improved though R LE feels weak.  She has pain with pushing foot in shoe.  Pt is limited with mobility, standing, and walking.  Pt unable to carry objects with walking.  She is very limited with household chores.  Pt is extremely slow and careful with showering.  Pt has been using a shower chair.  Pt performs her stairs twice per day.         Pt was having knee pain from OA in November 2024 which is feeling fine now.  PERTINENT HISTORY: Bilat knee OA, L knee arthroscopy 2013 Hx of SI pain R Shoulder surgery HTN  PAIN:  NPRS:  0/10 current, > 10/10 worst Location:  distal R HS  PRECAUTIONS: Other: knee OA    WEIGHT BEARING RESTRICTIONS: No  FALLS:   Has patient fallen in last 6 months? 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   PLOF: Independent  PATIENT GOALS: to remove crutches and walk normally   OBJECTIVE:  Note: Objective measures were completed at Evaluation unless otherwise noted.  DIAGNOSTIC FINDINGS: X rays per PA note:  tricompartmental degenerative changes present without acute displaced fracture or dislocation identified.  When compared to radiographs from Nov 2024, no significant changes identified.  PATIENT SURVEYS:  LEFS  Extreme difficulty/unable (0), Quite a bit of difficulty (1), Moderate difficulty (2), Little difficulty (3), No difficulty (4) Survey date:  01/06/24  Any of your usual work, housework or school activities 0  2. Usual hobbies, recreational or sporting activities 0  3. Getting into/out of the bath 3  4. Walking between rooms 3  5. Putting on socks/shoes 2  6. Squatting  0  7. Lifting an object, like a bag of groceries from the floor 0  8. Performing light activities around your home 0  9. Performing heavy activities around your home 0  10. Getting into/out of a car 2  11. Walking 2 blocks 0  12. Walking 1 mile 0  13. Going up/down 10 stairs (1 flight) 0  14. Standing for 1 hour 0  15.  sitting for 1 hour 0  16. Running on even ground 0  17. Running on uneven ground 0  18. Making sharp turns while running fast 0  19. Hopping  0  20. Rolling over in bed 4  Score total:  14/80     COGNITION: Overall cognitive status: Within functional limits for tasks assessed      EDEMA:  PT unable to view HS for any bruising due to pt's clothing.   PALPATION: TTP medial and lateral distal HS.   LOWER EXTREMITY ROM:   ROM Right eval Left Eval AROM  Hip flexion    Hip extension    Hip abduction    Hip adduction    Hip internal  rotation    Hip external rotation    Knee flexion 109 PROM 129  Knee extension 0 deg in resting 7  Ankle dorsiflexion    Ankle plantarflexion    Ankle inversion    Ankle eversion     (Blank rows = not tested)  LOWER EXTREMITY MMT:  MMT Right eval Left eval  Hip flexion    Hip extension    Hip abduction    Hip adduction    Hip internal  rotation    Hip external rotation    Knee flexion    Knee extension    Ankle dorsiflexion    Ankle plantarflexion    Ankle inversion    Ankle eversion     (Blank rows = not tested)   GAIT: Comments: Pt ambulated with bilat crutches with KI a step through gait pattern.  Pt ambulated without crutches with KI and felt some twinges with gait.                                                                                                                                TREATMENT:    8/4: Nu-step  LE's only L5 x68min STM to distal HS in prone (pilow under hips) Prone HSC 2x10 Prone hip extension 2x10ea Long sit HSS 30sec x3R Gait with single crutch  x228ft Standing HR 2x15 Standing hip abd 2x10ea/bil Standing marching 2x10 Hip hinge at back of bike- x10  7/29: Nu-step UE and LE L5 x59min STM to distal HS in prone (pilow under hips) Prone HSC 2x10 Prone hip extension 2x10ea Long sit HSS 30sec x3R Gait with single crutch  2x111ft Standing HR 2x15 Standing hip 3way 2x10ea/bil Standing marching 2x10 Standing adductor stretch  7/23: STM to distal HS in prone (pilow under hips) Prone HSC x10 Prone hip extension x10ea Gait with bil crutches and immobilizer doffed 2x118ft Gait along rail with unilateral support x1 lap SKTC with leg on red physioball 2x10 Submax isometrics with leg on physioball 5 x10 Nu-step UE and LE L1 x21min   Eval: Pt performed seated submaximal mid range HS isometric with 5 sec hold x 10 reps.  Pt denied any pain.  Pt received a HEP handout and was educated in correct form and appropriate frequency.  PT  instructed pt to stop exercise if she has pain.  PATIENT EDUCATION:  Education details: relevant anatomy, rationale of interventions, POC, dx, objective findings, prognosis, gait, crutch usage, HEP, ice usage, and positioning.  PT instructed pt to not stretch HS. PT answered questions. Person educated: Patient Education method: Explanation, Demonstration, Verbal cues, and Handouts Education comprehension: verbalized understanding, returned demonstration, verbal cues required, and needs further education  HOME EXERCISE PROGRAM: Seated mid range submaximal HS isometric once per day, 2 sets of 5-10 reps with 5 sec hold  ASSESSMENT:  CLINICAL IMPRESSION: Pt with improved anterior knee/quad tightness today. Remains significantly tender to palpation of lateral HS. Performed STM and IASTM using roller to address this. Initiated hip hinge movement today without increase in pain. At end of session, she did report fatigue and mild soreness, though no pain.    Eval: Patient is a 58 y.o. female with a dx of pain in R lower limb with suspected HS strain.  Pt is in [place] and ambulating with bilat crutches.  She has been feeling better this week since her exacerbation on Sunday.  Pt states her leg felt unstable though this is improving  as she is able to increase her Wb'ing through her R LE in [place] with gait.  She reports having weakness in R LE.  Pt is limited and has difficulty with her self care activities and ADLs/IADLs.  Pt is limited with standing and her normal functional mobility skills including walking and stairs.  Her pain is located in distal HS and she is tender to palpate medial and lateral distal HS.  Pt should benefit from skilled PT to address impairments and assist in returning pt to PLOF.   OBJECTIVE IMPAIRMENTS: Abnormal gait, decreased activity tolerance, decreased endurance, decreased mobility, difficulty walking, decreased ROM, decreased strength, hypomobility, increased fascial restrictions,  impaired flexibility, and pain.   ACTIVITY LIMITATIONS: carrying, bending, standing, squatting, stairs, transfers, and locomotion level, dressing, bathing  PARTICIPATION LIMITATIONS: meal prep, cleaning, laundry, driving, shopping, and community activity  PERSONAL FACTORS: 1-2 comorbidities: bilat knee OA, Hx of SI pain are also affecting patient's functional outcome.   REHAB POTENTIAL: Good  CLINICAL DECISION MAKING: Stable/uncomplicated  EVALUATION COMPLEXITY: Low   GOALS  SHORT TERM GOALS:   Pt will be independent and compliant with HEP for improved pain, strength, ROM, and function.  Baseline: Goal status: INITIAL Target date:  01/27/2024  2.  Pt will wean out of KI and off crutches without adverse effects.   Baseline:  Goal status: INITIAL Target date:  01/27/2024  3.  Pt will report she is able to ambulate community distance without any feelings of instability.  Baseline:  Goal status: INITIAL Target date: 02/03/2024  4.  Pt will ambulate with a normalized heel to toe gait pattern without limping.  Baseline:  Goal status: INITIAL Target date:  02/10/2024  5.  Pt will be able to perform her self care activities and shower transfers without increased pain and difficulty.  Baseline:  Goal status: INITIAL Target date:  02/17/2024  6.  Pt will be able to tolerate exercises without adverse effects for improved strength and proprioception to assist with returning to PLOF.  Baseline:  Goal status: INITIAL Target date:  02/10/2024  LONG TERM GOALS: Target date: 03/02/2024  Pt will be able to perform stairs with a reciprocal gait with good control without difficulty.  Baseline:  Goal status: INITIAL  2.  Pt will be able to perform her household chores without significant pain and difficulty.  Baseline:  Goal status: INITIAL  3.  Pt will ambulate extended community distance without significant pain and difficulty.  Baseline:  Goal status: INITIAL  4.  Pt will demo at  least a 4 to 4+/5 MMT strength in R HS for improved performance of functional mobility and tolerance of daily mobility.  Baseline:  Goal status: INITIAL     PLAN:  PT FREQUENCY: 1x/wk for 2-3 weeks and 2x/wk afterwards  PT DURATION: 8 weeks  PLANNED INTERVENTIONS: 97164- PT Re-evaluation, 97750- Physical Performance Testing, 97110-Therapeutic exercises, 97530- Therapeutic activity, [id number]- Neuromuscular re-education, 97535- Self Care, [id number]- Manual therapy, [phone]- Gait training, [phone]- Aquatic Therapy, [phone]- Electrical stimulation (unattended), [phone]- Electrical stimulation (manual), [id number]- Ultrasound, Patient/Family education, Balance training, Stair training, Taping, Joint mobilization, Spinal mobilization, DME instructions, Cryotherapy, and Moist heat  PLAN FOR NEXT SESSION: Cont per protocol, healing constrains, and sx's.  Gait training.  Ice as needed.   Asberry Rodes, PTA  01/24/24 12:30 PM

## 2024-02-01 ENCOUNTER — Encounter (HOSPITAL_BASED_OUTPATIENT_CLINIC_OR_DEPARTMENT_OTHER): Payer: Self-pay

## 2024-02-01 ENCOUNTER — Ambulatory Visit (HOSPITAL_BASED_OUTPATIENT_CLINIC_OR_DEPARTMENT_OTHER)

## 2024-02-01 DIAGNOSIS — M25661 Stiffness of right knee, not elsewhere classified: Secondary | ICD-10-CM | POA: Diagnosis not present

## 2024-02-01 DIAGNOSIS — M6281 Muscle weakness (generalized): Secondary | ICD-10-CM

## 2024-02-01 DIAGNOSIS — M79651 Pain in right thigh: Secondary | ICD-10-CM

## 2024-02-01 DIAGNOSIS — R262 Difficulty in walking, not elsewhere classified: Secondary | ICD-10-CM | POA: Diagnosis not present

## 2024-02-01 NOTE — Therapy (Signed)
 OUTPATIENT PHYSICAL THERAPY LOWER EXTREMITY TREATMENT   Patient Name: Amanda Mack MRN: [id number] DOB:30-Jun-1965, 58 y.o., female Today's Date: 02/01/2024  END OF SESSION:  PT End of Session - 02/01/24 1626     Visit Number 5    Number of Visits 14    Date for PT Re-Evaluation 03/02/24    Authorization Type [employer]    PT Start Time 1605    PT Stop Time 1649    PT Time Calculation (min) 44 min    Activity Tolerance Patient tolerated treatment well    Behavior During Therapy WFL for tasks assessed/performed              Past Medical History:  Diagnosis Date   Adopted    Allergy    Arthritis    GERD (gastroesophageal reflux disease)    H/O LEEP 1990   Hypertension    Migraines    Routine general medical examination at a health care facility 03/09/2023   Past Surgical History:  Procedure Laterality Date   KNEE ARTHROSCOPY Left 2019   SHOULDER SURGERY     Patient Active Problem List   Diagnosis Date Noted   Routine general medical examination at a health care facility 03/09/2023   Menopausal symptoms 08/13/2022   Hyperlipidemia 09/23/2020   Vitamin D  deficiency, unspecified 03/01/2019   Sinus tachycardia 09/29/2016   Essential hypertension 04/17/2016   Class 3 severe obesity with serious comorbidity and body mass index (BMI) of 40.0 to 44.9 in adult 04/17/2016     REFERRING PROVIDER: Bissell, Jaclyn, PA-C  REFERRING DIAG: Pain in R lower limb, M79.604  THERAPY DIAG:  Pain in right thigh  Muscle weakness (generalized)  Difficulty in walking, not elsewhere classified  Stiffness of right knee, not elsewhere classified  Rationale for Evaluation and Treatment: Rehabilitation  ONSET DATE: 12/17/2023 and 01/02/2024  SUBJECTIVE:   SUBJECTIVE STATEMENT:  Pt arrives with bil crutches. 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 She felt sore after the first time, but better after the second time. Feels bil knee joint pain  recently more so than HS.   Eval: Pt was moving a wood pile and had soreness in R HS on 12/17/2023.  She lifted up her bed and tried to move the rug with her leg.  She had increased pain the following AM.  Pt went to [facility] ortho and saw PA on 12/31/23.  Pt had x rays.  PA note indicated suspected strain of R biceps femoris.  Pt received crutches and PA prescribed prednisone  and robaxin .  He instructed her in using tylenol .  Pt also received home exercises including a standing stretch and supine heel slides.  She can't remember the 3rd exercise.  Pt states she felt better after using prednisone .    Pt was doing her exercises on 7/13 and felt and heard a pop when she stood up.  Pt had significant pain and felt unstable in knee.  She had increased pain with Wb'ing.  Pt saw PA on 01/03/24 and note indicated concern for a distal HS tear.  She was placed in a knee immobilizer and recommended to back of off stretching and activity.  PA ordered a MRI to rule out HS tear.  MRI is scheduled for 7/26 which pt may cancel if she is doing better.  She also refilled prednisone .   Pt states she started feeling better Tuesday and feels much better this week.  She is tolerating increased Wb'ing through R  LE and is using a KI.  Her pain has improved though R LE feels weak.  She has pain with pushing foot in shoe.  Pt is limited with mobility, standing, and walking.  Pt unable to carry objects with walking.  She is very limited with household chores.  Pt is extremely slow and careful with showering.  Pt has been using a shower chair.  Pt performs her stairs twice per day.         Pt was having knee pain from OA in November 2024 which is feeling fine now.  PERTINENT HISTORY: Bilat knee OA, L knee arthroscopy 2013 Hx of SI pain R Shoulder surgery HTN  PAIN:  NPRS:  0/10 current, > 10/10 worst Location:  distal R HS  PRECAUTIONS: Other: knee OA    WEIGHT BEARING RESTRICTIONS: No  FALLS:  Has patient fallen in last  6 months? 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   PLOF: Independent  PATIENT GOALS: to remove crutches and walk normally   OBJECTIVE:  Note: Objective measures were completed at Evaluation unless otherwise noted.  DIAGNOSTIC FINDINGS: X rays per PA note:  tricompartmental degenerative changes present without acute displaced fracture or dislocation identified.  When compared to radiographs from Nov 2024, no significant changes identified.  PATIENT SURVEYS:  LEFS  Extreme difficulty/unable (0), Quite a bit of difficulty (1), Moderate difficulty (2), Little difficulty (3), No difficulty (4) Survey date:  01/06/24  Any of your usual work, housework or school activities 0  2. Usual hobbies, recreational or sporting activities 0  3. Getting into/out of the bath 3  4. Walking between rooms 3  5. Putting on socks/shoes 2  6. Squatting  0  7. Lifting an object, like a bag of groceries from the floor 0  8. Performing light activities around your home 0  9. Performing heavy activities around your home 0  10. Getting into/out of a car 2  11. Walking 2 blocks 0  12. Walking 1 mile 0  13. Going up/down 10 stairs (1 flight) 0  14. Standing for 1 hour 0  15.  sitting for 1 hour 0  16. Running on even ground 0  17. Running on uneven ground 0  18. Making sharp turns while running fast 0  19. Hopping  0  20. Rolling over in bed 4  Score total:  14/80     COGNITION: Overall cognitive status: Within functional limits for tasks assessed      EDEMA:  PT unable to view HS for any bruising due to pt's clothing.   PALPATION: TTP medial and lateral distal HS.   LOWER EXTREMITY ROM:   ROM Right eval Left Eval AROM  Hip flexion    Hip extension    Hip abduction    Hip adduction    Hip internal rotation    Hip external rotation     Knee flexion 109 PROM 129  Knee extension 0 deg in resting 7  Ankle dorsiflexion    Ankle plantarflexion    Ankle inversion    Ankle eversion     (Blank rows = not tested)  LOWER EXTREMITY MMT:  MMT Right eval Left eval  Hip flexion    Hip extension    Hip abduction    Hip adduction    Hip internal rotation  Hip external rotation    Knee flexion    Knee extension    Ankle dorsiflexion    Ankle plantarflexion    Ankle inversion    Ankle eversion     (Blank rows = not tested)   GAIT: Comments: Pt ambulated with bilat crutches with KI a step through gait pattern.  Pt ambulated without crutches with KI and felt some twinges with gait.                                                                                                                                TREATMENT:    8/12: Recumbent bike L2 x65min Prone HSC 2x10 Seated HSC YTB 2x10ea Prone hip extension 2x10ea Thomas stretch bil Prone TKE 5 x15bil Hip flexor stretch at stairs 30sec x2ea Gait with cues for hip extension and TKE in stance phase Hip hinge x10 at wall   8/4: Nu-step  LE's only L5 x96min STM to distal HS in prone (pilow under hips) Prone HSC 2x10 Prone hip extension 2x10ea Long sit HSS 30sec x3R Gait with single crutch  x227ft Standing HR 2x15 Standing hip abd 2x10ea/bil Standing marching 2x10 Hip hinge at back of bike- x10  7/29: Nu-step UE and LE L5 x34min STM to distal HS in prone (pilow under hips) Prone HSC 2x10 Prone hip extension 2x10ea Long sit HSS 30sec x3R Gait with single crutch  2x153ft Standing HR 2x15 Standing hip 3way 2x10ea/bil Standing marching 2x10 Standing adductor stretch  7/23: STM to distal HS in prone (pilow under hips) Prone HSC x10 Prone hip extension x10ea Gait with bil crutches and immobilizer doffed 2x188ft Gait along rail with unilateral support x1 lap SKTC with leg on red physioball 2x10 Submax isometrics with leg on physioball 5  x10 Nu-step UE and LE L1 x20min   Eval: Pt performed seated submaximal mid range HS isometric with 5 sec hold x 10 reps.  Pt denied any pain.  Pt received a HEP handout and was educated in correct form and appropriate frequency.  PT instructed pt to stop exercise if she has pain.  PATIENT EDUCATION:  Education details: relevant anatomy, rationale of interventions, POC, dx, objective findings, prognosis, gait, crutch usage, HEP, ice usage, and positioning.  PT instructed pt to not stretch HS. PT answered questions. Person educated: Patient Education method: Explanation, Demonstration, Verbal cues, and Handouts Education comprehension: verbalized understanding, returned demonstration, verbal cues required, and needs further education  HOME EXERCISE PROGRAM: Seated mid range submaximal HS isometric once per day, 2 sets of 5-10 reps with 5 sec hold  ASSESSMENT:  CLINICAL IMPRESSION: Able to tolerate recumbent bike today without c/o pain.  Worked on ambulating without crutches today.  Patient demonstrates excessive hip flexion and decreased terminal knee extension with gait.  This did improve following targeted hip flexor stretches and prone TKE.  Patient advised to keep using single crutch for shorter distances.  Trialled hip hinges at wall today with  good tolerance for light loading of hamstrings.  Also able to tolerate light resistance with seated hamstring curls.  Patient is progressing well.  Will continue to work towards goals.  Eval: Patient is a 58 y.o. female with a dx of pain in R lower limb with suspected HS strain.  Pt is in [place] and ambulating with bilat crutches.  She has been feeling better this week since her exacerbation on Sunday.  Pt states her leg felt unstable though this is improving  as she is able to increase her Wb'ing through her R LE in [place] with gait.  She reports having weakness in R LE.  Pt is limited and has difficulty with her self care activities and ADLs/IADLs.  Pt is  limited with standing and her normal functional mobility skills including walking and stairs.  Her pain is located in distal HS and she is tender to palpate medial and lateral distal HS.  Pt should benefit from skilled PT to address impairments and assist in returning pt to PLOF.   OBJECTIVE IMPAIRMENTS: Abnormal gait, decreased activity tolerance, decreased endurance, decreased mobility, difficulty walking, decreased ROM, decreased strength, hypomobility, increased fascial restrictions, impaired flexibility, and pain.   ACTIVITY LIMITATIONS: carrying, bending, standing, squatting, stairs, transfers, and locomotion level, dressing, bathing  PARTICIPATION LIMITATIONS: meal prep, cleaning, laundry, driving, shopping, and community activity  PERSONAL FACTORS: 1-2 comorbidities: bilat knee OA, Hx of SI pain are also affecting patient's functional outcome.   REHAB POTENTIAL: Good  CLINICAL DECISION MAKING: Stable/uncomplicated  EVALUATION COMPLEXITY: Low   GOALS  SHORT TERM GOALS:   Pt will be independent and compliant with HEP for improved pain, strength, ROM, and function.  Baseline: Goal status: MET 8/12 Target date:  01/27/2024  2.  Pt will wean out of KI and off crutches without adverse effects.   Baseline:  Goal status: IN PROGRESS 8/12 Target date:  01/27/2024  3.  Pt will report she is able to ambulate community distance without any feelings of instability.  Baseline:  Goal status: IN PROGRESS 8/12 Target date: 02/03/2024  4.  Pt will ambulate with a normalized heel to toe gait pattern without limping.  Baseline:  Goal status IN PROGRESS 8/12 Target date:  02/10/2024  5.  Pt will be able to perform her self care activities and shower transfers without increased pain and difficulty.  Baseline:  Goal status: IN PROGRESS 8/12 Target date:  02/17/2024  6.  Pt will be able to tolerate exercises without adverse effects for improved strength and proprioception to assist with  returning to PLOF.  Baseline:  Goal status: INITIAL Target date:  02/10/2024  LONG TERM GOALS: Target date: 03/02/2024  Pt will be able to perform stairs with a reciprocal gait with good control without difficulty.  Baseline:  Goal status: INITIAL  2.  Pt will be able to perform her household chores without significant pain and difficulty.  Baseline:  Goal status: INITIAL  3.  Pt will ambulate extended community distance without significant pain and difficulty.  Baseline:  Goal status: INITIAL  4.  Pt will demo at least a 4 to 4+/5 MMT strength in R HS for improved performance of functional mobility and tolerance of daily mobility.  Baseline:  Goal status: INITIAL     PLAN:  PT FREQUENCY: 1x/wk for 2-3 weeks and 2x/wk afterwards  PT DURATION: 8 weeks  PLANNED INTERVENTIONS: [id number]- PT Re-evaluation, 97750- Physical Performance Testing, 97110-Therapeutic exercises, 97530- Therapeutic activity, [id number]- Neuromuscular re-education, 97535- Self Care, [id number]- Manual  therapy, [id number]- Gait training, 02886- Aquatic Therapy, [phone]- Electrical stimulation (unattended), [phone]- Electrical stimulation (manual), [id number]- Ultrasound, Patient/Family education, Balance training, Stair training, Taping, Joint mobilization, Spinal mobilization, DME instructions, Cryotherapy, and Moist heat  PLAN FOR NEXT SESSION: Cont per protocol, healing constrains, and sx's.  Gait training.  Ice as needed.   Asberry Rodes, PTA  02/01/24 5:05 PM

## 2024-02-04 ENCOUNTER — Encounter (HOSPITAL_BASED_OUTPATIENT_CLINIC_OR_DEPARTMENT_OTHER): Payer: Self-pay

## 2024-02-04 ENCOUNTER — Ambulatory Visit (HOSPITAL_BASED_OUTPATIENT_CLINIC_OR_DEPARTMENT_OTHER)

## 2024-02-04 DIAGNOSIS — M25661 Stiffness of right knee, not elsewhere classified: Secondary | ICD-10-CM

## 2024-02-04 DIAGNOSIS — M79651 Pain in right thigh: Secondary | ICD-10-CM

## 2024-02-04 DIAGNOSIS — M6281 Muscle weakness (generalized): Secondary | ICD-10-CM | POA: Diagnosis not present

## 2024-02-04 DIAGNOSIS — R262 Difficulty in walking, not elsewhere classified: Secondary | ICD-10-CM

## 2024-02-04 NOTE — Therapy (Signed)
 OUTPATIENT PHYSICAL THERAPY LOWER EXTREMITY TREATMENT   Patient Name: Amanda Mack MRN: [id number] DOB:[birth year], 58 y.o., female Today's Date: 02/04/2024  END OF SESSION:  PT End of Session - 02/04/24 1358     Visit Number 6    Number of Visits 14    Date for PT Re-Evaluation 03/02/24    Authorization Type [employer]    PT Start Time 1350    PT Stop Time 1430    PT Time Calculation (min) 40 min    Activity Tolerance Patient tolerated treatment well    Behavior During Therapy WFL for tasks assessed/performed               Past Medical History:  Diagnosis Date   Adopted    Allergy    Arthritis    GERD (gastroesophageal reflux disease)    H/O LEEP 1990   Hypertension    Migraines    Routine general medical examination at a health care facility 03/09/2023   Past Surgical History:  Procedure Laterality Date   KNEE ARTHROSCOPY Left 2019   SHOULDER SURGERY     Patient Active Problem List   Diagnosis Date Noted   Routine general medical examination at a health care facility 03/09/2023   Menopausal symptoms 08/13/2022   Hyperlipidemia 09/23/2020   Vitamin D  deficiency, unspecified 03/01/2019   Sinus tachycardia 09/29/2016   Essential hypertension 04/17/2016   Class 3 severe obesity with serious comorbidity and body mass index (BMI) of 40.0 to 44.9 in adult 04/17/2016     REFERRING PROVIDER: Bissell, Jaclyn, PA-C  REFERRING DIAG: Pain in R lower limb, M79.604  THERAPY DIAG:  Pain in right thigh  Muscle weakness (generalized)  Difficulty in walking, not elsewhere classified  Stiffness of right knee, not elsewhere classified  Rationale for Evaluation and Treatment: Rehabilitation  ONSET DATE: 12/17/2023 and 01/02/2024  SUBJECTIVE:   SUBJECTIVE STATEMENT:  Pt arrives with bil crutches. 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 She felt sore after the first time, but better after the second time. Feels bil knee joint pain  recently more so than HS.   Eval: Pt was moving a wood pile and had soreness in R HS on 12/17/2023.  She lifted up her bed and tried to move the rug with her leg.  She had increased pain the following AM.  Pt went to [facility] ortho and saw PA on 12/31/23.  Pt had x rays.  PA note indicated suspected strain of R biceps femoris.  Pt received crutches and PA prescribed prednisone  and robaxin .  He instructed her in using tylenol .  Pt also received home exercises including a standing stretch and supine heel slides.  She can't remember the 3rd exercise.  Pt states she felt better after using prednisone .    Pt was doing her exercises on 7/13 and felt and heard a pop when she stood up.  Pt had significant pain and felt unstable in knee.  She had increased pain with Wb'ing.  Pt saw PA on 01/03/24 and note indicated concern for a distal HS tear.  She was placed in a knee immobilizer and recommended to back of off stretching and activity.  PA ordered a MRI to rule out HS tear.  MRI is scheduled for 7/26 which pt may cancel if she is doing better.  She also refilled prednisone .   Pt states she started feeling better Tuesday and feels much better this week.  She is tolerating increased Wb'ing through  R LE and is using a KI.  Her pain has improved though R LE feels weak.  She has pain with pushing foot in shoe.  Pt is limited with mobility, standing, and walking.  Pt unable to carry objects with walking.  She is very limited with household chores.  Pt is extremely slow and careful with showering.  Pt has been using a shower chair.  Pt performs her stairs twice per day.         Pt was having knee pain from OA in November 2024 which is feeling fine now.  PERTINENT HISTORY: Bilat knee OA, L knee arthroscopy 2013 Hx of SI pain R Shoulder surgery HTN  PAIN:  NPRS:  0/10 current, > 10/10 worst Location:  distal R HS  PRECAUTIONS: Other: knee OA    WEIGHT BEARING RESTRICTIONS: No  FALLS:  Has patient fallen in last  6 months? 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   PLOF: Independent  PATIENT GOALS: to remove crutches and walk normally   OBJECTIVE:  Note: Objective measures were completed at Evaluation unless otherwise noted.  DIAGNOSTIC FINDINGS: X rays per PA note:  tricompartmental degenerative changes present without acute displaced fracture or dislocation identified.  When compared to radiographs from Nov 2024, no significant changes identified.  PATIENT SURVEYS:  LEFS  Extreme difficulty/unable (0), Quite a bit of difficulty (1), Moderate difficulty (2), Little difficulty (3), No difficulty (4) Survey date:  01/06/24  Any of your usual work, housework or school activities 0  2. Usual hobbies, recreational or sporting activities 0  3. Getting into/out of the bath 3  4. Walking between rooms 3  5. Putting on socks/shoes 2  6. Squatting  0  7. Lifting an object, like a bag of groceries from the floor 0  8. Performing light activities around your home 0  9. Performing heavy activities around your home 0  10. Getting into/out of a car 2  11. Walking 2 blocks 0  12. Walking 1 mile 0  13. Going up/down 10 stairs (1 flight) 0  14. Standing for 1 hour 0  15.  sitting for 1 hour 0  16. Running on even ground 0  17. Running on uneven ground 0  18. Making sharp turns while running fast 0  19. Hopping  0  20. Rolling over in bed 4  Score total:  14/80     COGNITION: Overall cognitive status: Within functional limits for tasks assessed      EDEMA:  PT unable to view HS for any bruising due to pt's clothing.   PALPATION: TTP medial and lateral distal HS.   LOWER EXTREMITY ROM:   ROM Right eval Left Eval AROM  Hip flexion    Hip extension    Hip abduction    Hip adduction    Hip internal rotation    Hip external rotation     Knee flexion 109 PROM 129  Knee extension 0 deg in resting 7  Ankle dorsiflexion    Ankle plantarflexion    Ankle inversion    Ankle eversion     (Blank rows = not tested)  LOWER EXTREMITY MMT:  MMT Right eval Left eval  Hip flexion    Hip extension    Hip abduction    Hip adduction    Hip internal rotation  Hip external rotation    Knee flexion    Knee extension    Ankle dorsiflexion    Ankle plantarflexion    Ankle inversion    Ankle eversion     (Blank rows = not tested)   GAIT: Comments: Pt ambulated with bilat crutches with KI a step through gait pattern.  Pt ambulated without crutches with KI and felt some twinges with gait.                                                                                                                                TREATMENT:    8/14: Recumbent bike L2 x65min Prone HSC 2# x10 Seated HSC YTB 2x10ea Prone hip extension 2x10ea S/l hip aduction 2x10ea Prone TKE 5 x15bil Hip flexor stretch at stairs 30sec x2ea Gait with cues for hip extension and TKE in stance phase Hip hinge 2x10 at wall Sit to stands on plinth x10 ITB stretch with strap 3x20sec R   8/12: Recumbent bike L2 x15min Prone HSC 2x10 Seated HSC YTB 2x10ea Prone hip extension 2x10ea Thomas stretch bil Prone TKE 5 x15bil Hip flexor stretch at stairs 30sec x2ea Gait with cues for hip extension and TKE in stance phase Hip hinge x10 at wall   8/4: Nu-step  LE's only L5 x92min STM to distal HS in prone (pilow under hips) Prone HSC 2x10 Prone hip extension 2x10ea Long sit HSS 30sec x3R Gait with single crutch  x271ft Standing HR 2x15 Standing hip abd 2x10ea/bil Standing marching 2x10 Hip hinge at back of bike- x10  7/29: Nu-step UE and LE L5 x41min STM to distal HS in prone (pilow under hips) Prone HSC 2x10 Prone hip extension 2x10ea Long sit HSS 30sec x3R Gait with single crutch  2x171ft Standing HR 2x15 Standing hip 3way  2x10ea/bil Standing marching 2x10 Standing adductor stretch  7/23: STM to distal HS in prone (pilow under hips) Prone HSC x10 Prone hip extension x10ea Gait with bil crutches and immobilizer doffed 2x140ft Gait along rail with unilateral support x1 lap SKTC with leg on red physioball 2x10 Submax isometrics with leg on physioball 5 x10 Nu-step UE and LE L1 x67min   Eval: Pt performed seated submaximal mid range HS isometric with 5 sec hold x 10 reps.  Pt denied any pain.  Pt received a HEP handout and was educated in correct form and appropriate frequency.  PT instructed pt to stop exercise if she has pain.  PATIENT EDUCATION:  Education details: relevant anatomy, rationale of interventions, POC, dx, objective findings, prognosis, gait, crutch usage, HEP, ice usage, and positioning.  PT instructed pt to not stretch HS. PT answered questions. Person educated: Patient Education method: Explanation, Demonstration, Verbal cues, and Handouts Education comprehension: verbalized understanding, returned demonstration, verbal cues required, and needs further education  HOME EXERCISE PROGRAM: Seated mid range submaximal HS isometric once per day, 2 sets of 5-10 reps with 5 sec hold  ASSESSMENT:  CLINICAL IMPRESSION: Pt fatigues quickly with hip strengthening exercises, specifically prone hip extension and s/l hip abduction. Did feel minor discomfort in distal HS with s/l abduction, but no lingering pain. Tender when laying on R hip. Added ITB stretching to address tightness in lateral R LE. Lumbar/SIJ discomfort reported when sitting EOB following plinth exercises. This dissipated after short rest break. Will continue to work on LE strengthening, gait mechanics, and ROM.  Eval: Patient is a 58 y.o. female with a dx of pain in R lower limb with suspected HS strain.  Pt is in [place] and ambulating with bilat crutches.  She has been feeling better this week since her exacerbation on Sunday.  Pt states  her leg felt unstable though this is improving  as she is able to increase her Wb'ing through her R LE in [place] with gait.  She reports having weakness in R LE.  Pt is limited and has difficulty with her self care activities and ADLs/IADLs.  Pt is limited with standing and her normal functional mobility skills including walking and stairs.  Her pain is located in distal HS and she is tender to palpate medial and lateral distal HS.  Pt should benefit from skilled PT to address impairments and assist in returning pt to PLOF.   OBJECTIVE IMPAIRMENTS: Abnormal gait, decreased activity tolerance, decreased endurance, decreased mobility, difficulty walking, decreased ROM, decreased strength, hypomobility, increased fascial restrictions, impaired flexibility, and pain.   ACTIVITY LIMITATIONS: carrying, bending, standing, squatting, stairs, transfers, and locomotion level, dressing, bathing  PARTICIPATION LIMITATIONS: meal prep, cleaning, laundry, driving, shopping, and community activity  PERSONAL FACTORS: 1-2 comorbidities: bilat knee OA, Hx of SI pain are also affecting patient's functional outcome.   REHAB POTENTIAL: Good  CLINICAL DECISION MAKING: Stable/uncomplicated  EVALUATION COMPLEXITY: Low   GOALS  SHORT TERM GOALS:   Pt will be independent and compliant with HEP for improved pain, strength, ROM, and function.  Baseline: Goal status: MET 8/12 Target date:  01/27/2024  2.  Pt will wean out of KI and off crutches without adverse effects.   Baseline:  Goal status: IN PROGRESS 8/12 Target date:  01/27/2024  3.  Pt will report she is able to ambulate community distance without any feelings of instability.  Baseline:  Goal status: IN PROGRESS 8/12 Target date: 02/03/2024  4.  Pt will ambulate with a normalized heel to toe gait pattern without limping.  Baseline:  Goal status IN PROGRESS 8/12 Target date:  02/10/2024  5.  Pt will be able to perform her self care activities and shower  transfers without increased pain and difficulty.  Baseline:  Goal status: IN PROGRESS 8/12 Target date:  02/17/2024  6.  Pt will be able to tolerate exercises without adverse effects for improved strength and proprioception to assist with returning to PLOF.  Baseline:  Goal status: INITIAL Target date:  02/10/2024  LONG TERM GOALS: Target date: 03/02/2024  Pt will be able to perform stairs with a reciprocal gait with good control without difficulty.  Baseline:  Goal status: INITIAL  2.  Pt will be able to perform her household chores without significant pain and difficulty.  Baseline:  Goal status: INITIAL  3.  Pt will ambulate extended community distance without significant pain and difficulty.  Baseline:  Goal status: INITIAL  4.  Pt will demo at least a 4 to 4+/5 MMT strength in R HS for improved performance of functional mobility and tolerance of daily mobility.  Baseline:  Goal status: INITIAL  PLAN:  PT FREQUENCY: 1x/wk for 2-3 weeks and 2x/wk afterwards  PT DURATION: 8 weeks  PLANNED INTERVENTIONS: [id number]- PT Re-evaluation, 97750- Physical Performance Testing, 97110-Therapeutic exercises, 97530- Therapeutic activity, [id number]- Neuromuscular re-education, 97535- Self Care, [id number]- Manual therapy, [phone]- Gait training, [phone]- Aquatic Therapy, [phone]- Electrical stimulation (unattended), [phone]- Electrical stimulation (manual), [id number]- Ultrasound, Patient/Family education, Balance training, Stair training, Taping, Joint mobilization, Spinal mobilization, DME instructions, Cryotherapy, and Moist heat  PLAN FOR NEXT SESSION: Cont per protocol, healing constrains, and sx's.  Gait training.  Ice as needed.   Asberry Rodes, PTA  02/04/24 2:58 PM

## 2024-02-08 ENCOUNTER — Encounter (HOSPITAL_BASED_OUTPATIENT_CLINIC_OR_DEPARTMENT_OTHER): Payer: Self-pay

## 2024-02-08 ENCOUNTER — Ambulatory Visit (HOSPITAL_BASED_OUTPATIENT_CLINIC_OR_DEPARTMENT_OTHER)

## 2024-02-08 DIAGNOSIS — M25661 Stiffness of right knee, not elsewhere classified: Secondary | ICD-10-CM | POA: Diagnosis not present

## 2024-02-08 DIAGNOSIS — R262 Difficulty in walking, not elsewhere classified: Secondary | ICD-10-CM | POA: Diagnosis not present

## 2024-02-08 DIAGNOSIS — M79651 Pain in right thigh: Secondary | ICD-10-CM

## 2024-02-08 DIAGNOSIS — M6281 Muscle weakness (generalized): Secondary | ICD-10-CM

## 2024-02-08 NOTE — Therapy (Signed)
 OUTPATIENT PHYSICAL THERAPY LOWER EXTREMITY TREATMENT   Patient Name: Amanda Mack MRN: [id number] DOB:08-Nov-1965, 58 y.o., female Today's Date: 02/08/2024  END OF SESSION:  PT End of Session - 02/08/24 1607     Visit Number 7    Number of Visits 14    Date for PT Re-Evaluation 03/02/24    Authorization Type BCBS    PT Start Time 1604    PT Stop Time 1650    PT Time Calculation (min) 46 min    Activity Tolerance Patient tolerated treatment well    Behavior During Therapy WFL for tasks assessed/performed                Past Medical History:  Diagnosis Date   Adopted    Allergy    Arthritis    GERD (gastroesophageal reflux disease)    H/O LEEP 1990   Hypertension    Migraines    Routine general medical examination at a health care facility 03/09/2023   Past Surgical History:  Procedure Laterality Date   KNEE ARTHROSCOPY Left 2019   SHOULDER SURGERY     Patient Active Problem List   Diagnosis Date Noted   Routine general medical examination at a health care facility 03/09/2023   Menopausal symptoms 08/13/2022   Hyperlipidemia 09/23/2020   Vitamin D  deficiency, unspecified 03/01/2019   Sinus tachycardia 09/29/2016   Essential hypertension 04/17/2016   Class 3 severe obesity with serious comorbidity and body mass index (BMI) of 40.0 to 44.9 in adult 04/17/2016     REFERRING PROVIDER: Bissell, Jaclyn, PA-C  REFERRING DIAG: Pain in R lower limb, M79.604  THERAPY DIAG:  Pain in right thigh  Muscle weakness (generalized)  Difficulty in walking, not elsewhere classified  Stiffness of right knee, not elsewhere classified  Rationale for Evaluation and Treatment: Rehabilitation  ONSET DATE: 12/17/2023 and 01/02/2024  SUBJECTIVE:   SUBJECTIVE STATEMENT:  Pt reports she is doing better with knee joint stiffness. Feels her walking is improving.   Eval: Pt was moving a wood pile and had soreness in R HS on 12/17/2023.  She lifted up her bed and  tried to move the rug with her leg.  She had increased pain the following AM.  Pt went to [facility] ortho and saw PA on 12/31/23.  Pt had x rays.  PA note indicated suspected strain of R biceps femoris.  Pt received crutches and PA prescribed prednisone  and robaxin .  He instructed her in using tylenol .  Pt also received home exercises including a standing stretch and supine heel slides.  She can't remember the 3rd exercise.  Pt states she felt better after using prednisone .    Pt was doing her exercises on 7/13 and felt and heard a pop when she stood up.  Pt had significant pain and felt unstable in knee.  She had increased pain with Wb'ing.  Pt saw PA on 01/03/24 and note indicated concern for a distal HS tear.  She was placed in a knee immobilizer and recommended to back of off stretching and activity.  PA ordered a MRI to rule out HS tear.  MRI is scheduled for 7/26 which pt may cancel if she is doing better.  She also refilled prednisone .   Pt states she started feeling better Tuesday and feels much better this week.  She is tolerating increased Wb'ing through R LE and is using a KI.  Her pain has improved though R LE feels weak.  She has pain with pushing foot in  shoe.  Pt is limited with mobility, standing, and walking.  Pt unable to carry objects with walking.  She is very limited with household chores.  Pt is extremely slow and careful with showering.  Pt has been using a shower chair.  Pt performs her stairs twice per day.         Pt was having knee pain from OA in November 2024 which is feeling fine now.  PERTINENT HISTORY: Bilat knee OA, L knee arthroscopy 2013 Hx of SI pain R Shoulder surgery HTN  PAIN:  NPRS:  0/10 current, > 10/10 worst Location:  distal R HS  PRECAUTIONS: Other: knee OA    WEIGHT BEARING RESTRICTIONS: No  FALLS:  Has patient fallen in last 6 months? 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   PLOF: Independent  PATIENT GOALS: to remove crutches and walk normally   OBJECTIVE:  Note: Objective measures were completed at Evaluation unless otherwise noted.  DIAGNOSTIC FINDINGS: X rays per PA note:  tricompartmental degenerative changes present without acute displaced fracture or dislocation identified.  When compared to radiographs from Nov 2024, no significant changes identified.  PATIENT SURVEYS:  LEFS  Extreme difficulty/unable (0), Quite a bit of difficulty (1), Moderate difficulty (2), Little difficulty (3), No difficulty (4) Survey date:  01/06/24  Any of your usual work, housework or school activities 0  2. Usual hobbies, recreational or sporting activities 0  3. Getting into/out of the bath 3  4. Walking between rooms 3  5. Putting on socks/shoes 2  6. Squatting  0  7. Lifting an object, like a bag of groceries from the floor 0  8. Performing light activities around your home 0  9. Performing heavy activities around your home 0  10. Getting into/out of a car 2  11. Walking 2 blocks 0  12. Walking 1 mile 0  13. Going up/down 10 stairs (1 flight) 0  14. Standing for 1 hour 0  15.  sitting for 1 hour 0  16. Running on even ground 0  17. Running on uneven ground 0  18. Making sharp turns while running fast 0  19. Hopping  0  20. Rolling over in bed 4  Score total:  14/80     COGNITION: Overall cognitive status: Within functional limits for tasks assessed      EDEMA:  PT unable to view HS for any bruising due to pt's clothing.   PALPATION: TTP medial and lateral distal HS.   LOWER EXTREMITY ROM:   ROM Right eval Left Eval AROM  Hip flexion    Hip extension    Hip abduction    Hip adduction    Hip internal rotation    Hip external rotation    Knee flexion 109 PROM 129  Knee extension 0 deg in resting 7  Ankle dorsiflexion    Ankle plantarflexion    Ankle  inversion    Ankle eversion     (Blank rows = not tested)  LOWER EXTREMITY MMT:  MMT Right eval Left eval  Hip flexion    Hip extension    Hip abduction    Hip adduction    Hip internal rotation    Hip external rotation    Knee flexion    Knee extension    Ankle dorsiflexion    Ankle plantarflexion  Ankle inversion    Ankle eversion     (Blank rows = not tested)   GAIT: Comments: Pt ambulated with bilat crutches with KI a step through gait pattern.  Pt ambulated without crutches with KI and felt some twinges with gait.                                                                                                                                TREATMENT:    8/19: Nu-step L4 x57min  Prone HSC 2# x10ea Prone hip extension 2x10ea HS isometric hooklying 5 2x10ea S/l hip aduction 2x10ea Prone TKE 5 x15bil Hip flexor stretch at stairs 30sec x2ea Gait with cues for hip extension and TKE in stance phase Sit to stands on plinth 2x10 HS stretch with strap 3x20sec bil LAQ 4# 3 2x10   8/14: Recumbent bike L2 x67min Prone HSC 2# x10 Seated HSC YTB 2x10ea Prone hip extension 2x10ea S/l hip aduction 2x10ea Prone TKE 5 x15bil Hip flexor stretch at stairs 30sec x2ea Gait with cues for hip extension and TKE in stance phase Hip hinge 2x10 at wall Sit to stands on plinth x10 ITB stretch with strap 3x20sec R   8/12: Recumbent bike L2 x57min Prone HSC 2x10 Seated HSC YTB 2x10ea Prone hip extension 2x10ea Thomas stretch bil Prone TKE 5 x15bil Hip flexor stretch at stairs 30sec x2ea Gait with cues for hip extension and TKE in stance phase Hip hinge x10 at wall   8/4: Nu-step  LE's only L5 x46min STM to distal HS in prone (pilow under hips) Prone HSC 2x10 Prone hip extension 2x10ea Long sit HSS 30sec x3R Gait with single crutch  x298ft Standing HR 2x15 Standing hip abd 2x10ea/bil Standing marching 2x10 Hip hinge at back of bike- x10  7/29: Nu-step UE and  LE L5 x5min STM to distal HS in prone (pilow under hips) Prone HSC 2x10 Prone hip extension 2x10ea Long sit HSS 30sec x3R Gait with single crutch  2x124ft Standing HR 2x15 Standing hip 3way 2x10ea/bil Standing marching 2x10 Standing adductor stretch  7/23: STM to distal HS in prone (pilow under hips) Prone HSC x10 Prone hip extension x10ea Gait with bil crutches and immobilizer doffed 2x186ft Gait along rail with unilateral support x1 lap SKTC with leg on red physioball 2x10 Submax isometrics with leg on physioball 5 x10 Nu-step UE and LE L1 x77min   Eval: Pt performed seated submaximal mid range HS isometric with 5 sec hold x 10 reps.  Pt denied any pain.  Pt received a HEP handout and was educated in correct form and appropriate frequency.  PT instructed pt to stop exercise if she has pain.  PATIENT EDUCATION:  Education details: relevant anatomy, rationale of interventions, POC, dx, objective findings, prognosis, gait, crutch usage, HEP, ice usage, and positioning.  PT instructed pt to not stretch HS. PT answered questions. Person educated: Patient Education method: Explanation, Demonstration, Verbal cues, and Handouts Education comprehension:  verbalized understanding, returned demonstration, verbal cues required, and needs further education  HOME EXERCISE PROGRAM: Seated mid range submaximal HS isometric once per day, 2 sets of 5-10 reps with 5 sec hold  ASSESSMENT:  CLINICAL IMPRESSION: Continued to work on hip flexor stretching today to address her lack of this with ambulation. This has improved compared to previous sessions. Pt remains tender to palpation in medial HS which is present bilaterally. Tolerated HS stretching with strap well, but did have increase in knee stiffness following this. She continues to use single crutch for longer distances. Will plan to progress CKC strengthening next visit as tolerated.   Eval: Patient is a 58 y.o. female with a dx of pain in R  lower limb with suspected HS strain.  Pt is in [place] and ambulating with bilat crutches.  She has been feeling better this week since her exacerbation on Sunday.  Pt states her leg felt unstable though this is improving  as she is able to increase her Wb'ing through her R LE in [place] with gait.  She reports having weakness in R LE.  Pt is limited and has difficulty with her self care activities and ADLs/IADLs.  Pt is limited with standing and her normal functional mobility skills including walking and stairs.  Her pain is located in distal HS and she is tender to palpate medial and lateral distal HS.  Pt should benefit from skilled PT to address impairments and assist in returning pt to PLOF.   OBJECTIVE IMPAIRMENTS: Abnormal gait, decreased activity tolerance, decreased endurance, decreased mobility, difficulty walking, decreased ROM, decreased strength, hypomobility, increased fascial restrictions, impaired flexibility, and pain.   ACTIVITY LIMITATIONS: carrying, bending, standing, squatting, stairs, transfers, and locomotion level, dressing, bathing  PARTICIPATION LIMITATIONS: meal prep, cleaning, laundry, driving, shopping, and community activity  PERSONAL FACTORS: 1-2 comorbidities: bilat knee OA, Hx of SI pain are also affecting patient's functional outcome.   REHAB POTENTIAL: Good  CLINICAL DECISION MAKING: Stable/uncomplicated  EVALUATION COMPLEXITY: Low   GOALS  SHORT TERM GOALS:   Pt will be independent and compliant with HEP for improved pain, strength, ROM, and function.  Baseline: Goal status: MET 8/12 Target date:  01/27/2024  2.  Pt will wean out of KI and off crutches without adverse effects.   Baseline:  Goal status: IN PROGRESS 8/12 Target date:  01/27/2024  3.  Pt will report she is able to ambulate community distance without any feelings of instability.  Baseline:  Goal status: IN PROGRESS 8/12 Target date: 02/03/2024  4.  Pt will ambulate with a normalized heel to toe  gait pattern without limping.  Baseline:  Goal status IN PROGRESS 8/12 Target date:  02/10/2024  5.  Pt will be able to perform her self care activities and shower transfers without increased pain and difficulty.  Baseline:  Goal status: IN PROGRESS 8/12 Target date:  02/17/2024  6.  Pt will be able to tolerate exercises without adverse effects for improved strength and proprioception to assist with returning to PLOF.  Baseline:  Goal status: INITIAL Target date:  02/10/2024  LONG TERM GOALS: Target date: 03/02/2024  Pt will be able to perform stairs with a reciprocal gait with good control without difficulty.  Baseline:  Goal status: INITIAL  2.  Pt will be able to perform her household chores without significant pain and difficulty.  Baseline:  Goal status: INITIAL  3.  Pt will ambulate extended community distance without significant pain and difficulty.  Baseline:  Goal status:  INITIAL  4.  Pt will demo at least a 4 to 4+/5 MMT strength in R HS for improved performance of functional mobility and tolerance of daily mobility.  Baseline:  Goal status: INITIAL     PLAN:  PT FREQUENCY: 1x/wk for 2-3 weeks and 2x/wk afterwards  PT DURATION: 8 weeks  PLANNED INTERVENTIONS: [id number]- PT Re-evaluation, 97750- Physical Performance Testing, 97110-Therapeutic exercises, 97530- Therapeutic activity, [id number]- Neuromuscular re-education, 97535- Self Care, [id number]- Manual therapy, [phone]- Gait training, [phone]- Aquatic Therapy, [phone]- Electrical stimulation (unattended), [phone]- Electrical stimulation (manual), [id number]- Ultrasound, Patient/Family education, Balance training, Stair training, Taping, Joint mobilization, Spinal mobilization, DME instructions, Cryotherapy, and Moist heat  PLAN FOR NEXT SESSION: Cont per protocol, healing constrains, and sx's.  Gait training.  Ice as needed.   Asberry Rodes, PTA  02/08/24 5:13 PM

## 2024-02-09 ENCOUNTER — Encounter (HOSPITAL_BASED_OUTPATIENT_CLINIC_OR_DEPARTMENT_OTHER): Payer: Self-pay | Admitting: Obstetrics & Gynecology

## 2024-02-09 ENCOUNTER — Ambulatory Visit (HOSPITAL_BASED_OUTPATIENT_CLINIC_OR_DEPARTMENT_OTHER): Admitting: Obstetrics & Gynecology

## 2024-02-09 ENCOUNTER — Other Ambulatory Visit (HOSPITAL_COMMUNITY)
Admission: RE | Admit: 2024-02-09 | Discharge: 2024-02-09 | Disposition: A | Discharge status: Home / Self Care | Source: Ambulatory Visit | Attending: Obstetrics & Gynecology | Admitting: Obstetrics & Gynecology

## 2024-02-09 ENCOUNTER — Other Ambulatory Visit (HOSPITAL_BASED_OUTPATIENT_CLINIC_OR_DEPARTMENT_OTHER): Payer: Self-pay

## 2024-02-09 VITALS — BP 133/89 | HR 73 | Ht 65.0 in | Wt 248.0 lb

## 2024-02-09 DIAGNOSIS — Z124 Encounter for screening for malignant neoplasm of cervix: Secondary | ICD-10-CM

## 2024-02-09 DIAGNOSIS — Z01419 Encounter for gynecological examination (general) (routine) without abnormal findings: Secondary | ICD-10-CM

## 2024-02-09 DIAGNOSIS — Z1151 Encounter for screening for human papillomavirus (HPV): Secondary | ICD-10-CM | POA: Diagnosis not present

## 2024-02-09 DIAGNOSIS — R000 Tachycardia, unspecified: Secondary | ICD-10-CM

## 2024-02-09 DIAGNOSIS — E559 Vitamin D deficiency, unspecified: Secondary | ICD-10-CM

## 2024-02-09 DIAGNOSIS — Z79890 Hormone replacement therapy (postmenopausal): Secondary | ICD-10-CM

## 2024-02-09 MED ORDER — ESTRADIOL 0.5 MG PO TABS
0.5000 mg | ORAL_TABLET | Freq: Every day | ORAL | 3 refills | Status: AC
  Filled 2024-02-09: qty 30, 30d supply, fill #0
  Filled 2024-02-18: qty 90, 90d supply, fill #0
  Filled 2024-05-16: qty 90, 90d supply, fill #1

## 2024-02-09 MED ORDER — PROGESTERONE MICRONIZED 100 MG PO CAPS
ORAL_CAPSULE | ORAL | 3 refills | Status: AC
  Filled 2024-02-09: qty 14, 28d supply, fill #0
  Filled 2024-02-18: qty 42, 84d supply, fill #0

## 2024-02-09 NOTE — Progress Notes (Signed)
 ANNUAL EXAM Patient name: Amanda Mack MRN [id number]  Date of birth: Mar 29, 1966 Chief Complaint:   Gynecologic Exam  History of Present Illness:   Amanda Mack is a 58 y.o. G0P0000 Caucasian female being seen today for a routine annual exam.  Had recent hamstring injury.  Has been using a crutch since May.  Will be until September.  Doing PT.  Really frustrated with weight but she can't do very much for activity level.  Off Losartan  right now.  Followed by Dr. [place].  Had blood work in September, 2024.    Denies vaginal bleeding.     No LMP recorded (lmp unknown). Patient is postmenopausal.   Last pap 09/23/2020. Results were: normal. H/O abnormal pap: yes Last mammogram: 01/14/24. Results were: normal. Family h/o breast cancer: no Last colonoscopy: 12/17/23. Results were:polyps.  Follow up 3 years.  Family h/o colorectal cancer: no     02/09/2024    8:40 AM 11/05/2022    3:33 PM 08/13/2022   11:22 AM 12/30/2021    9:54 AM 04/22/2021    5:22 PM  Depression screen PHQ 2/9  Decreased Interest 0 0 0 0 0  Down, Depressed, Hopeless 0 0 0 0 0  PHQ - 2 Score 0 0 0 0 0        10/24/2019   11:43 AM  GAD 7 : Generalized Anxiety Score  Nervous, Anxious, on Edge 0  Control/stop worrying 0  Worry too much - different things 1  Trouble relaxing 1  Restless 0  Easily annoyed or irritable 0  Afraid - awful might happen 0  Total GAD 7 Score 2  Anxiety Difficulty Not difficult at all    Review of Systems:   Pertinent items are noted in HPI Denies any urinary or bowel changes.  Denies pelvic pain.   Pertinent History Reviewed:  Reviewed past medical,surgical, social and family history.  Reviewed problem list, medications and allergies. Physical Assessment:   Vitals:   02/09/24 0839  BP: 133/89  Pulse: 73  SpO2: 97%  Weight: 248 lb (112.5 kg)  Height: 5' 5 (1.651 m)  Body mass index is 41.27 kg/m.        Physical Examination:   General appearance - well appearing,  and in no distress  Mental status - alert, oriented to person, place, and time  Psych:  She has a normal mood and affect  Skin - warm and dry, normal color, no suspicious lesions noted  Chest - effort normal, all lung fields clear to auscultation bilaterally  Heart - normal rate and regular rhythm  Neck:  midline trachea, no thyromegaly or nodules  Breasts - breasts appear normal, no suspicious masses, no skin or nipple changes or  axillary nodes  Abdomen - soft, nontender, nondistended, no masses or organomegaly  Pelvic - VULVA: normal appearing vulva with no masses, tenderness or lesions   VAGINA: normal appearing vagina with normal color and discharge, no lesions   CERVIX: normal appearing cervix without discharge or lesions, no CMT  Thin prep pap is done with HR HPV cotesting  UTERUS: uterus is felt to be normal size, shape, consistency and nontender   ADNEXA: No adnexal masses or tenderness noted.  Rectal - deferred  Extremities:  No swelling or varicosities noted  Chaperone present for exam  No results found for this or any previous visit (from the past 24 hours).  Assessment & Plan:  1. Well woman exam with routine gynecological exam (Primary) - Pap  smear with HR HPV obtained today - Mammogram 12/2023 - Colonoscopy 11/2023 - Bone mineral density discussed - lab work done with PCP, Dr. [place] - vaccines reviewed/updated  2. Cervical cancer screening - Cytology - PAP( [place])  3. Vitamin D  deficiency, unspecified  4. Sinus tachycardia - on metoprolol   5. Hormone replacement therapy (HRT) - progesterone  (PROMETRIUM ) 100 MG capsule; Take 1 capsule by mouth nightly on days 1-14 of each month.  Dispense: 42 capsule; Refill: 3 - estradiol  (ESTRACE ) 0.5 MG tablet; Take 1 tablet (0.5 mg total) by mouth daily.  Dispense: 90 tablet; Refill: 3   No orders of the defined types were placed in this encounter.   Meds:  Meds ordered this encounter  Medications   progesterone   (PROMETRIUM ) 100 MG capsule    Sig: Take 1 capsule by mouth nightly on days 1-14 of each month.    Dispense:  42 capsule    Refill:  3   estradiol  (ESTRACE ) 0.5 MG tablet    Sig: Take 1 tablet (0.5 mg total) by mouth daily.    Dispense:  90 tablet    Refill:  3    Follow-up: Return in about 1 year (around 02/08/2025).  Ronal GORMAN Pinal, MD 02/09/2024 9:16 AM

## 2024-02-11 ENCOUNTER — Encounter (HOSPITAL_BASED_OUTPATIENT_CLINIC_OR_DEPARTMENT_OTHER): Payer: Self-pay | Admitting: Physical Therapy

## 2024-02-11 ENCOUNTER — Ambulatory Visit (HOSPITAL_BASED_OUTPATIENT_CLINIC_OR_DEPARTMENT_OTHER): Admitting: Physical Therapy

## 2024-02-11 DIAGNOSIS — M6281 Muscle weakness (generalized): Secondary | ICD-10-CM | POA: Diagnosis not present

## 2024-02-11 DIAGNOSIS — R262 Difficulty in walking, not elsewhere classified: Secondary | ICD-10-CM

## 2024-02-11 DIAGNOSIS — M25661 Stiffness of right knee, not elsewhere classified: Secondary | ICD-10-CM

## 2024-02-11 DIAGNOSIS — M79651 Pain in right thigh: Secondary | ICD-10-CM | POA: Diagnosis not present

## 2024-02-11 NOTE — Therapy (Signed)
 OUTPATIENT PHYSICAL THERAPY LOWER EXTREMITY TREATMENT   Patient Name: Amanda Mack MRN: [id number] DOB:1965/09/12, 58 y.o., female Today's Date: 02/11/2024  END OF SESSION:  PT End of Session - 02/11/24 1421     Visit Number 8    Number of Visits 14    Date for PT Re-Evaluation 03/02/24    Authorization Type [employer]    PT Start Time 1352    PT Stop Time 1430    PT Time Calculation (min) 38 min    Activity Tolerance Patient tolerated treatment well    Behavior During Therapy WFL for tasks assessed/performed                 Past Medical History:  Diagnosis Date   Adopted    Allergy    Arthritis    GERD (gastroesophageal reflux disease)    H/O LEEP 1990   Hypertension    Migraines    Routine general medical examination at a health care facility 03/09/2023   Past Surgical History:  Procedure Laterality Date   KNEE ARTHROSCOPY Left 2019   SHOULDER SURGERY     Patient Active Problem List   Diagnosis Date Noted   Routine general medical examination at a health care facility 03/09/2023   Menopausal symptoms 08/13/2022   Hyperlipidemia 09/23/2020   Vitamin D  deficiency, unspecified 03/01/2019   Sinus tachycardia 09/29/2016   Essential hypertension 04/17/2016   Class 3 severe obesity with serious comorbidity and body mass index (BMI) of 40.0 to 44.9 in adult 04/17/2016     REFERRING PROVIDER: Bissell, Jaclyn, PA-C  REFERRING DIAG: Pain in R lower limb, M79.604  THERAPY DIAG:  Pain in right thigh  Muscle weakness (generalized)  Difficulty in walking, not elsewhere classified  Stiffness of right knee, not elsewhere classified  Rationale for Evaluation and Treatment: Rehabilitation  ONSET DATE: 12/17/2023 and 01/02/2024  SUBJECTIVE:   SUBJECTIVE STATEMENT:  I did my classes (water fitness) twice this week, HS is a little tired. Not re-aggravating the pain. If I sit too long and then go to stand up, I get more aching in the knee joints. Things are  tight and I need to make sure I move enough so it doesn't get too stiff. Stretching has been helpful, strengthening has also been helpful.  Muscle is more tired than usual today due to two fitness week.    Eval: Pt was moving a wood pile and had soreness in R HS on 12/17/2023.  She lifted up her bed and tried to move the rug with her leg.  She had increased pain the following AM.  Pt went to [facility] ortho and saw PA on 12/31/23.  Pt had x rays.  PA note indicated suspected strain of R biceps femoris.  Pt received crutches and PA prescribed prednisone  and robaxin .  He instructed her in using tylenol .  Pt also received home exercises including a standing stretch and supine heel slides.  She can't remember the 3rd exercise.  Pt states she felt better after using prednisone .    Pt was doing her exercises on 7/13 and felt and heard a pop when she stood up.  Pt had significant pain and felt unstable in knee.  She had increased pain with Wb'ing.  Pt saw PA on 01/03/24 and note indicated concern for a distal HS tear.  She was placed in a knee immobilizer and recommended to back of off stretching and activity.  PA ordered a MRI to rule out HS tear.  MRI is  scheduled for 7/26 which pt may cancel if she is doing better.  She also refilled prednisone .   Pt states she started feeling better Tuesday and feels much better this week.  She is tolerating increased Wb'ing through R LE and is using a KI.  Her pain has improved though R LE feels weak.  She has pain with pushing foot in shoe.  Pt is limited with mobility, standing, and walking.  Pt unable to carry objects with walking.  She is very limited with household chores.  Pt is extremely slow and careful with showering.  Pt has been using a shower chair.  Pt performs her stairs twice per day.         Pt was having knee pain from OA in November 2024 which is feeling fine now.  PERTINENT HISTORY: Bilat knee OA, L knee arthroscopy 2013 Hx of SI pain R Shoulder  surgery HTN  PAIN:  NPRS:  0/10 current, in the past week 1/10 at worst  Location:  distal R HS  PRECAUTIONS: Other: knee OA    WEIGHT BEARING RESTRICTIONS: No  FALLS:  Has patient fallen in last 6 months? 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   PLOF: Independent  PATIENT GOALS: to remove crutches and walk normally   OBJECTIVE:  Note: Objective measures were completed at Evaluation unless otherwise noted.  DIAGNOSTIC FINDINGS: X rays per PA note:  tricompartmental degenerative changes present without acute displaced fracture or dislocation identified.  When compared to radiographs from Nov 2024, no significant changes identified.  PATIENT SURVEYS:  LEFS  Extreme difficulty/unable (0), Quite a bit of difficulty (1), Moderate difficulty (2), Little difficulty (3), No difficulty (4) Survey date:  01/06/24  Any of your usual work, housework or school activities 0  2. Usual hobbies, recreational or sporting activities 0  3. Getting into/out of the bath 3  4. Walking between rooms 3  5. Putting on socks/shoes 2  6. Squatting  0  7. Lifting an object, like a bag of groceries from the floor 0  8. Performing light activities around your home 0  9. Performing heavy activities around your home 0  10. Getting into/out of a car 2  11. Walking 2 blocks 0  12. Walking 1 mile 0  13. Going up/down 10 stairs (1 flight) 0  14. Standing for 1 hour 0  15.  sitting for 1 hour 0  16. Running on even ground 0  17. Running on uneven ground 0  18. Making sharp turns while running fast 0  19. Hopping  0  20. Rolling over in bed 4  Score total:  14/80     COGNITION: Overall cognitive status: Within functional limits for tasks assessed      EDEMA:  PT unable to view HS for any bruising due to pt's clothing.   PALPATION: TTP  medial and lateral distal HS.   LOWER EXTREMITY ROM:   ROM Right eval Left Eval AROM  Hip flexion    Hip extension    Hip abduction    Hip adduction    Hip internal rotation    Hip external rotation    Knee flexion 109 PROM 129  Knee extension 0 deg in resting 7  Ankle dorsiflexion    Ankle plantarflexion    Ankle inversion    Ankle eversion     (  Blank rows = not tested)  LOWER EXTREMITY MMT:  MMT Right eval Left eval  Hip flexion    Hip extension    Hip abduction    Hip adduction    Hip internal rotation    Hip external rotation    Knee flexion    Knee extension    Ankle dorsiflexion    Ankle plantarflexion    Ankle inversion    Ankle eversion     (Blank rows = not tested)   GAIT: Comments: Pt ambulated with bilat crutches with KI a step through gait pattern.  Pt ambulated without crutches with KI and felt some twinges with gait.                                                                                                                                TREATMENT:    02/11/24  Scifit bike L4x6 minutes for w/u   Shuttle BLE press 75# x12 Shuttle single leg press 37# R LE 2x12, L LE 2x12 for active recovery between RLE sets  Forward step ups 4 inch box x12 B Lateral step ups 4 inch box x12 B Squats at bar x12 self selected depth Bridges + ABD red TB x15 Sidelying hip ABD red TB x12 B Attempted single leg bridges unable R (no pain just weakness) Walking bridge x5  HS stretches 2x30 seconds B       8/19: Nu-step L4 x59min  Prone HSC 2# x10ea Prone hip extension 2x10ea HS isometric hooklying 5 2x10ea S/l hip aduction 2x10ea Prone TKE 5 x15bil Hip flexor stretch at stairs 30sec x2ea Gait with cues for hip extension and TKE in stance phase Sit to stands on plinth 2x10 HS stretch with strap 3x20sec bil LAQ 4# 3 2x10   8/14: Recumbent bike L2 x24min Prone HSC 2# x10 Seated HSC YTB 2x10ea Prone hip extension 2x10ea S/l hip aduction  2x10ea Prone TKE 5 x15bil Hip flexor stretch at stairs 30sec x2ea Gait with cues for hip extension and TKE in stance phase Hip hinge 2x10 at wall Sit to stands on plinth x10 ITB stretch with strap 3x20sec R   8/12: Recumbent bike L2 x54min Prone HSC 2x10 Seated HSC YTB 2x10ea Prone hip extension 2x10ea Thomas stretch bil Prone TKE 5 x15bil Hip flexor stretch at stairs 30sec x2ea Gait with cues for hip extension and TKE in stance phase Hip hinge x10 at wall   8/4: Nu-step  LE's only L5 x56min STM to distal HS in prone (pilow under hips) Prone HSC 2x10 Prone hip extension 2x10ea Long sit HSS 30sec x3R Gait with single crutch  x258ft Standing HR 2x15 Standing hip abd 2x10ea/bil Standing marching 2x10 Hip hinge at back of bike- x10  7/29: Nu-step UE and LE L5 x40min STM to distal HS in prone (pilow under hips) Prone HSC 2x10 Prone hip extension 2x10ea Long sit HSS 30sec x3R Gait with single crutch  2x120ft Standing HR 2x15  Standing hip 3way 2x10ea/bil Standing marching 2x10 Standing adductor stretch  7/23: STM to distal HS in prone (pilow under hips) Prone HSC x10 Prone hip extension x10ea Gait with bil crutches and immobilizer doffed 2x135ft Gait along rail with unilateral support x1 lap SKTC with leg on red physioball 2x10 Submax isometrics with leg on physioball 5 x10 Nu-step UE and LE L1 x78min   Eval: Pt performed seated submaximal mid range HS isometric with 5 sec hold x 10 reps.  Pt denied any pain.  Pt received a HEP handout and was educated in correct form and appropriate frequency.  PT instructed pt to stop exercise if she has pain.  PATIENT EDUCATION:  Education details: relevant anatomy, rationale of interventions, POC, dx, objective findings, prognosis, gait, crutch usage, HEP, ice usage, and positioning.  PT instructed pt to not stretch HS. PT answered questions. Person educated: Patient Education method: Explanation, Demonstration, Verbal cues,  and Handouts Education comprehension: verbalized understanding, returned demonstration, verbal cues required, and needs further education  HOME EXERCISE PROGRAM: Seated mid range submaximal HS isometric once per day, 2 sets of 5-10 reps with 5 sec hold  ASSESSMENT:  CLINICAL IMPRESSION:   Arrived today doing OK, feeling more tired than usual due to having had taught (modified) twice this week. Had discussed her case with other therapists who had previously treated her and increased challenges during today's session to help assess potential readiness to reduce frequency moving forward. She felt OK after [facility] exercises and progressions, we agreed to see how she feels in the morning (usually when HS feels the worst) and will make final decision whether to drop to 1x/week or not at next visit based on this.       Eval: Patient is a 58 y.o. female with a dx of pain in R lower limb with suspected HS strain.  Pt is in [place] and ambulating with bilat crutches.  She has been feeling better this week since her exacerbation on Sunday.  Pt states her leg felt unstable though this is improving  as she is able to increase her Wb'ing through her R LE in [place] with gait.  She reports having weakness in R LE.  Pt is limited and has difficulty with her self care activities and ADLs/IADLs.  Pt is limited with standing and her normal functional mobility skills including walking and stairs.  Her pain is located in distal HS and she is tender to palpate medial and lateral distal HS.  Pt should benefit from skilled PT to address impairments and assist in returning pt to PLOF.   OBJECTIVE IMPAIRMENTS: Abnormal gait, decreased activity tolerance, decreased endurance, decreased mobility, difficulty walking, decreased ROM, decreased strength, hypomobility, increased fascial restrictions, impaired flexibility, and pain.   ACTIVITY LIMITATIONS: carrying, bending, standing, squatting, stairs, transfers, and locomotion level,  dressing, bathing  PARTICIPATION LIMITATIONS: meal prep, cleaning, laundry, driving, shopping, and community activity  PERSONAL FACTORS: 1-2 comorbidities: bilat knee OA, Hx of SI pain are also affecting patient's functional outcome.   REHAB POTENTIAL: Good  CLINICAL DECISION MAKING: Stable/uncomplicated  EVALUATION COMPLEXITY: Low   GOALS  SHORT TERM GOALS:   Pt will be independent and compliant with HEP for improved pain, strength, ROM, and function.  Baseline: Goal status: MET 8/12 Target date:  01/27/2024  2.  Pt will wean out of KI and off crutches without adverse effects.   Baseline:  Goal status: IN PROGRESS 8/12 Target date:  01/27/2024  3.  Pt will report she is able  to ambulate community distance without any feelings of instability.  Baseline:  Goal status: IN PROGRESS 8/12 Target date: 02/03/2024  4.  Pt will ambulate with a normalized heel to toe gait pattern without limping.  Baseline:  Goal status IN PROGRESS 8/12 Target date:  02/10/2024  5.  Pt will be able to perform her self care activities and shower transfers without increased pain and difficulty.  Baseline:  Goal status: IN PROGRESS 8/12 Target date:  02/17/2024  6.  Pt will be able to tolerate exercises without adverse effects for improved strength and proprioception to assist with returning to PLOF.  Baseline:  Goal status: INITIAL Target date:  02/10/2024  LONG TERM GOALS: Target date: 03/02/2024  Pt will be able to perform stairs with a reciprocal gait with good control without difficulty.  Baseline:  Goal status: INITIAL  2.  Pt will be able to perform her household chores without significant pain and difficulty.  Baseline:  Goal status: INITIAL  3.  Pt will ambulate extended community distance without significant pain and difficulty.  Baseline:  Goal status: INITIAL  4.  Pt will demo at least a 4 to 4+/5 MMT strength in R HS for improved performance of functional mobility and tolerance of  daily mobility.  Baseline:  Goal status: INITIAL     PLAN:  PT FREQUENCY: 1x/wk for 2-3 weeks and 2x/wk afterwards  PT DURATION: 8 weeks  PLANNED INTERVENTIONS: [id number]- PT Re-evaluation, 97750- Physical Performance Testing, 97110-Therapeutic exercises, 97530- Therapeutic activity, [id number]- Neuromuscular re-education, 97535- Self Care, [id number]- Manual therapy, [phone]- Gait training, [phone]- Aquatic Therapy, [phone]- Electrical stimulation (unattended), [phone]- Electrical stimulation (manual), [id number]- Ultrasound, Patient/Family education, Balance training, Stair training, Taping, Joint mobilization, Spinal mobilization, DME instructions, Cryotherapy, and Moist heat  PLAN FOR NEXT SESSION: Cont per protocol, healing constrains, and sx's.  Gait training.  Ice as needed. How did she feel after more aggressive session- were sx manageable the day after/is it realistic to drop to 1x/week based on sx response?   Josette Rough, PT, DPT 02/11/24 2:42 PM

## 2024-02-15 ENCOUNTER — Encounter (HOSPITAL_BASED_OUTPATIENT_CLINIC_OR_DEPARTMENT_OTHER): Payer: Self-pay

## 2024-02-15 ENCOUNTER — Ambulatory Visit (HOSPITAL_BASED_OUTPATIENT_CLINIC_OR_DEPARTMENT_OTHER)

## 2024-02-15 DIAGNOSIS — M79651 Pain in right thigh: Secondary | ICD-10-CM

## 2024-02-15 DIAGNOSIS — M6281 Muscle weakness (generalized): Secondary | ICD-10-CM | POA: Diagnosis not present

## 2024-02-15 DIAGNOSIS — M25661 Stiffness of right knee, not elsewhere classified: Secondary | ICD-10-CM

## 2024-02-15 DIAGNOSIS — R262 Difficulty in walking, not elsewhere classified: Secondary | ICD-10-CM | POA: Diagnosis not present

## 2024-02-15 LAB — CYTOLOGY - PAP
Adequacy: ABSENT
Comment: NEGATIVE
Comment: NEGATIVE
Comment: NEGATIVE
Diagnosis: UNDETERMINED — AB
HPV 16: POSITIVE — AB
HPV 18 / 45: NEGATIVE
High risk HPV: POSITIVE — AB

## 2024-02-15 NOTE — Therapy (Signed)
 OUTPATIENT PHYSICAL THERAPY LOWER EXTREMITY TREATMENT   Patient Name: Amanda Mack MRN: [id number] DOB:[birth year], 58 y.o., female Today's Date: 02/15/2024  END OF SESSION:  PT End of Session - 02/15/24 1607     Visit Number 9    Number of Visits 14    Date for PT Re-Evaluation 03/02/24    Authorization Type [employer]    PT Start Time 1603    PT Stop Time 1648    PT Time Calculation (min) 45 min    Activity Tolerance Patient tolerated treatment well    Behavior During Therapy WFL for tasks assessed/performed                  Past Medical History:  Diagnosis Date   Adopted    Allergy    Arthritis    GERD (gastroesophageal reflux disease)    H/O LEEP 1990   Hypertension    Migraines    Routine general medical examination at a health care facility 03/09/2023   Past Surgical History:  Procedure Laterality Date   KNEE ARTHROSCOPY Left 2019   SHOULDER SURGERY     Patient Active Problem List   Diagnosis Date Noted   Routine general medical examination at a health care facility 03/09/2023   Menopausal symptoms 08/13/2022   Hyperlipidemia 09/23/2020   Vitamin D  deficiency, unspecified 03/01/2019   Sinus tachycardia 09/29/2016   Essential hypertension 04/17/2016   Class 3 severe obesity with serious comorbidity and body mass index (BMI) of 40.0 to 44.9 in adult 04/17/2016     REFERRING PROVIDER: Bissell, Jaclyn, PA-C  REFERRING DIAG: Pain in R lower limb, M79.604  THERAPY DIAG:  Pain in right thigh  Muscle weakness (generalized)  Difficulty in walking, not elsewhere classified  Stiffness of right knee, not elsewhere classified  Rationale for Evaluation and Treatment: Rehabilitation  ONSET DATE: 12/17/2023 and 01/02/2024  SUBJECTIVE:   SUBJECTIVE STATEMENT:  Pt reports no increase in pain after last session, only fatigue in HS. Arrives with single crutch.    Eval: Pt was moving a wood pile and had soreness in R HS on 12/17/2023.  She lifted up  her bed and tried to move the rug with her leg.  She had increased pain the following AM.  Pt went to [facility] ortho and saw PA on 12/31/23.  Pt had x rays.  PA note indicated suspected strain of R biceps femoris.  Pt received crutches and PA prescribed prednisone  and robaxin .  He instructed her in using tylenol .  Pt also received home exercises including a standing stretch and supine heel slides.  She can't remember the 3rd exercise.  Pt states she felt better after using prednisone .    Pt was doing her exercises on 7/13 and felt and heard a pop when she stood up.  Pt had significant pain and felt unstable in knee.  She had increased pain with Wb'ing.  Pt saw PA on 01/03/24 and note indicated concern for a distal HS tear.  She was placed in a knee immobilizer and recommended to back of off stretching and activity.  PA ordered a MRI to rule out HS tear.  MRI is scheduled for 7/26 which pt may cancel if she is doing better.  She also refilled prednisone .   Pt states she started feeling better Tuesday and feels much better this week.  She is tolerating increased Wb'ing through R LE and is using a KI.  Her pain has improved though R LE feels weak.  She has  pain with pushing foot in shoe.  Pt is limited with mobility, standing, and walking.  Pt unable to carry objects with walking.  She is very limited with household chores.  Pt is extremely slow and careful with showering.  Pt has been using a shower chair.  Pt performs her stairs twice per day.         Pt was having knee pain from OA in November 2024 which is feeling fine now.  PERTINENT HISTORY: Bilat knee OA, L knee arthroscopy 2013 Hx of SI pain R Shoulder surgery HTN  PAIN:  NPRS:  0/10 current, in the past week 1/10 at worst  Location:  distal R HS  PRECAUTIONS: Other: knee OA    WEIGHT BEARING RESTRICTIONS: No  FALLS:  Has patient fallen in last 6 months? 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   PLOF: Independent  PATIENT GOALS: to remove crutches and walk normally   OBJECTIVE:  Note: Objective measures were completed at Evaluation unless otherwise noted.  DIAGNOSTIC FINDINGS: X rays per PA note:  tricompartmental degenerative changes present without acute displaced fracture or dislocation identified.  When compared to radiographs from Nov 2024, no significant changes identified.  PATIENT SURVEYS:  LEFS  Extreme difficulty/unable (0), Quite a bit of difficulty (1), Moderate difficulty (2), Little difficulty (3), No difficulty (4) Survey date:  01/06/24  Any of your usual work, housework or school activities 0  2. Usual hobbies, recreational or sporting activities 0  3. Getting into/out of the bath 3  4. Walking between rooms 3  5. Putting on socks/shoes 2  6. Squatting  0  7. Lifting an object, like a bag of groceries from the floor 0  8. Performing light activities around your home 0  9. Performing heavy activities around your home 0  10. Getting into/out of a car 2  11. Walking 2 blocks 0  12. Walking 1 mile 0  13. Going up/down 10 stairs (1 flight) 0  14. Standing for 1 hour 0  15.  sitting for 1 hour 0  16. Running on even ground 0  17. Running on uneven ground 0  18. Making sharp turns while running fast 0  19. Hopping  0  20. Rolling over in bed 4  Score total:  14/80     COGNITION: Overall cognitive status: Within functional limits for tasks assessed      EDEMA:  PT unable to view HS for any bruising due to pt's clothing.   PALPATION: TTP medial and lateral distal HS.   LOWER EXTREMITY ROM:   ROM Right eval Left Eval AROM  Hip flexion    Hip extension    Hip abduction    Hip adduction    Hip internal rotation    Hip external rotation    Knee flexion 109 PROM 129  Knee extension 0 deg in resting 7  Ankle dorsiflexion     Ankle plantarflexion    Ankle inversion    Ankle eversion     (Blank rows = not tested)  LOWER EXTREMITY MMT:  MMT Right eval Left eval  Hip flexion    Hip extension    Hip abduction    Hip adduction    Hip internal rotation    Hip external rotation    Knee flexion    Knee extension  Ankle dorsiflexion    Ankle plantarflexion    Ankle inversion    Ankle eversion     (Blank rows = not tested)   GAIT: Comments: Pt ambulated with bilat crutches with KI a step through gait pattern.  Pt ambulated without crutches with KI and felt some twinges with gait.                                                                                                                                TREATMENT:    02/15/24  Scifit bike L3x5 minutes  Roller to R HS Prone HSC x10 Prone eccentric HS with manual resistance 2x5 Prone HS isometrics multiangle  Prone hip extension with knee flexed x10 Prone hip extension with HSC 2x10 Sidelying hip ABD red TB x12 B Forward step ups 6 inch box x12 B Lateral step ups 6 inch box x12 B Squats at bar x12 self selected depth Shuttle BLE press 75# 2x20   02/11/24  Scifit bike L4x6 minutes for w/u   Shuttle BLE press 75# x12 Shuttle single leg press 37# R LE 2x12, L LE 2x12 for active recovery between RLE sets  Forward step ups 4 inch box x12 B Lateral step ups 4 inch box x12 B Squats at bar x12 self selected depth Bridges + ABD red TB x15 Sidelying hip ABD red TB x12 B Attempted single leg bridges unable R (no pain just weakness) Walking bridge x5  HS stretches 2x30 seconds B       8/19: Nu-step L4 x56min  Prone HSC 2# x10ea Prone hip extension 2x10ea HS isometric hooklying 5 2x10ea S/l hip aduction 2x10ea Prone TKE 5 x15bil Hip flexor stretch at stairs 30sec x2ea Gait with cues for hip extension and TKE in stance phase Sit to stands on plinth 2x10 HS stretch with strap 3x20sec bil LAQ 4# 3 2x10   8/14: Recumbent bike  L2 x7min Prone HSC 2# x10 Seated HSC YTB 2x10ea Prone hip extension 2x10ea S/l hip aduction 2x10ea Prone TKE 5 x15bil Hip flexor stretch at stairs 30sec x2ea Gait with cues for hip extension and TKE in stance phase Hip hinge 2x10 at wall Sit to stands on plinth x10 ITB stretch with strap 3x20sec R   8/12: Recumbent bike L2 x50min Prone HSC 2x10 Seated HSC YTB 2x10ea Prone hip extension 2x10ea Thomas stretch bil Prone TKE 5 x15bil Hip flexor stretch at stairs 30sec x2ea Gait with cues for hip extension and TKE in stance phase Hip hinge x10 at wall   8/4: Nu-step  LE's only L5 x27min STM to distal HS in prone (pilow under hips) Prone HSC 2x10 Prone hip extension 2x10ea Long sit HSS 30sec x3R Gait with single crutch  x240ft Standing HR 2x15 Standing hip abd 2x10ea/bil Standing marching 2x10 Hip hinge at back of bike- x10  7/29: Nu-step UE and LE L5 x72min STM to distal HS in prone (pilow under hips) Prone HSC 2x10  Prone hip extension 2x10ea Long sit HSS 30sec x3R Gait with single crutch  2x155ft Standing HR 2x15 Standing hip 3way 2x10ea/bil Standing marching 2x10 Standing adductor stretch  7/23: STM to distal HS in prone (pilow under hips) Prone HSC x10 Prone hip extension x10ea Gait with bil crutches and immobilizer doffed 2x139ft Gait along rail with unilateral support x1 lap SKTC with leg on red physioball 2x10 Submax isometrics with leg on physioball 5 x10 Nu-step UE and LE L1 x68min   Eval: Pt performed seated submaximal mid range HS isometric with 5 sec hold x 10 reps.  Pt denied any pain.  Pt received a HEP handout and was educated in correct form and appropriate frequency.  PT instructed pt to stop exercise if she has pain.  PATIENT EDUCATION:  Education details: relevant anatomy, rationale of interventions, POC, dx, objective findings, prognosis, gait, crutch usage, HEP, ice usage, and positioning.  PT instructed pt to not stretch HS. PT answered  questions. Person educated: Patient Education method: Programmer, multimedia, Demonstration, Verbal cues, and Handouts Education comprehension: verbalized understanding, returned demonstration, verbal cues required, and needs further education  HOME EXERCISE PROGRAM: Seated mid range submaximal HS isometric once per day, 2 sets of 5-10 reps with 5 sec hold  ASSESSMENT:  CLINICAL IMPRESSION:  Pt c/o increased mid R HS tightness so spent time on roller to this area to decrease restrictions. Pt tender to this area and had to back off pressure. Worked on HS activation and strengthening in prone without complaint of increased pain. Able to progress to 6 step today without complaint with fwd and lateral step ups. Pt will benefit from additional MT, strengthening, and gait training at future sessions. Pt would like to wean off crutch soon.       Eval: Patient is a 58 y.o. female with a dx of pain in R lower limb with suspected HS strain.  Pt is in [place] and ambulating with bilat crutches.  She has been feeling better this week since her exacerbation on Sunday.  Pt states her leg felt unstable though this is improving  as she is able to increase her Wb'ing through her R LE in [place] with gait.  She reports having weakness in R LE.  Pt is limited and has difficulty with her self care activities and ADLs/IADLs.  Pt is limited with standing and her normal functional mobility skills including walking and stairs.  Her pain is located in distal HS and she is tender to palpate medial and lateral distal HS.  Pt should benefit from skilled PT to address impairments and assist in returning pt to PLOF.   OBJECTIVE IMPAIRMENTS: Abnormal gait, decreased activity tolerance, decreased endurance, decreased mobility, difficulty walking, decreased ROM, decreased strength, hypomobility, increased fascial restrictions, impaired flexibility, and pain.   ACTIVITY LIMITATIONS: carrying, bending, standing, squatting, stairs, transfers, and  locomotion level, dressing, bathing  PARTICIPATION LIMITATIONS: meal prep, cleaning, laundry, driving, shopping, and community activity  PERSONAL FACTORS: 1-2 comorbidities: bilat knee OA, Hx of SI pain are also affecting patient's functional outcome.   REHAB POTENTIAL: Good  CLINICAL DECISION MAKING: Stable/uncomplicated  EVALUATION COMPLEXITY: Low   GOALS  SHORT TERM GOALS:   Pt will be independent and compliant with HEP for improved pain, strength, ROM, and function.  Baseline: Goal status: MET 8/12 Target date:  01/27/2024  2.  Pt will wean out of KI and off crutches without adverse effects.   Baseline:  Goal status: IN PROGRESS 8/12 Target date:  01/27/2024  3.  Pt will report she is able to ambulate community distance without any feelings of instability.  Baseline:  Goal status: IN PROGRESS 8/12 Target date: 02/03/2024  4.  Pt will ambulate with a normalized heel to toe gait pattern without limping.  Baseline:  Goal status IN PROGRESS 8/12 Target date:  02/10/2024  5.  Pt will be able to perform her self care activities and shower transfers without increased pain and difficulty.  Baseline:  Goal status: IN PROGRESS 8/12 Target date:  02/17/2024  6.  Pt will be able to tolerate exercises without adverse effects for improved strength and proprioception to assist with returning to PLOF.  Baseline:  Goal status: INITIAL Target date:  02/10/2024  LONG TERM GOALS: Target date: 03/02/2024  Pt will be able to perform stairs with a reciprocal gait with good control without difficulty.  Baseline:  Goal status: INITIAL  2.  Pt will be able to perform her household chores without significant pain and difficulty.  Baseline:  Goal status: INITIAL  3.  Pt will ambulate extended community distance without significant pain and difficulty.  Baseline:  Goal status: INITIAL  4.  Pt will demo at least a 4 to 4+/5 MMT strength in R HS for improved performance of functional  mobility and tolerance of daily mobility.  Baseline:  Goal status: INITIAL     PLAN:  PT FREQUENCY: 1x/wk for 2-3 weeks and 2x/wk afterwards  PT DURATION: 8 weeks  PLANNED INTERVENTIONS: 97164- PT Re-evaluation, 97750- Physical Performance Testing, 97110-Therapeutic exercises, 97530- Therapeutic activity, [id number]- Neuromuscular re-education, 97535- Self Care, [id number]- Manual therapy, [phone]- Gait training, [phone]- Aquatic Therapy, [phone]- Electrical stimulation (unattended), [phone]- Electrical stimulation (manual), [id number]- Ultrasound, Patient/Family education, Balance training, Stair training, Taping, Joint mobilization, Spinal mobilization, DME instructions, Cryotherapy, and Moist heat  PLAN FOR NEXT SESSION: Cont per protocol, healing constrains, and sx's.  Gait training.  Ice as needed. How did she feel after more aggressive session- were sx manageable the day after/is it realistic to drop to 1x/week based on sx response?   Asberry Rodes, PTA  02/15/24 5:03 PM

## 2024-02-17 ENCOUNTER — Ambulatory Visit (HOSPITAL_BASED_OUTPATIENT_CLINIC_OR_DEPARTMENT_OTHER): Payer: Self-pay | Admitting: Obstetrics & Gynecology

## 2024-02-18 ENCOUNTER — Ambulatory Visit (HOSPITAL_BASED_OUTPATIENT_CLINIC_OR_DEPARTMENT_OTHER): Admitting: Physical Therapy

## 2024-02-18 ENCOUNTER — Encounter (HOSPITAL_BASED_OUTPATIENT_CLINIC_OR_DEPARTMENT_OTHER): Payer: Self-pay | Admitting: Physical Therapy

## 2024-02-18 ENCOUNTER — Other Ambulatory Visit (HOSPITAL_BASED_OUTPATIENT_CLINIC_OR_DEPARTMENT_OTHER): Payer: Self-pay

## 2024-02-18 DIAGNOSIS — R262 Difficulty in walking, not elsewhere classified: Secondary | ICD-10-CM | POA: Diagnosis not present

## 2024-02-18 DIAGNOSIS — M6281 Muscle weakness (generalized): Secondary | ICD-10-CM

## 2024-02-18 DIAGNOSIS — M79651 Pain in right thigh: Secondary | ICD-10-CM

## 2024-02-18 DIAGNOSIS — M25661 Stiffness of right knee, not elsewhere classified: Secondary | ICD-10-CM | POA: Diagnosis not present

## 2024-02-18 NOTE — Therapy (Signed)
 OUTPATIENT PHYSICAL THERAPY LOWER EXTREMITY TREATMENT   Patient Name: Amanda Mack MRN: [id number] DOB:Nov 27, 1965, 58 y.o., female Today's Date: 02/18/2024  END OF SESSION:  PT End of Session - 02/18/24 1428     Visit Number 10    Number of Visits 14    Date for PT Re-Evaluation 03/02/24    Authorization Type BC[employer]    PT Start Time 1352   arrived few minutes late   PT Stop Time 1427    PT Time Calculation (min) 35 min    Activity Tolerance Patient tolerated treatment well    Behavior During Therapy WFL for tasks assessed/performed                   Past Medical History:  Diagnosis Date   Adopted    Allergy    Arthritis    GERD (gastroesophageal reflux disease)    H/O LEEP 1990   Hypertension    Migraines    Routine general medical examination at a health care facility 03/09/2023   Past Surgical History:  Procedure Laterality Date   KNEE ARTHROSCOPY Left 2019   SHOULDER SURGERY     Patient Active Problem List   Diagnosis Date Noted   Routine general medical examination at a health care facility 03/09/2023   Menopausal symptoms 08/13/2022   Hyperlipidemia 09/23/2020   Vitamin D  deficiency, unspecified 03/01/2019   Sinus tachycardia 09/29/2016   Essential hypertension 04/17/2016   Class 3 severe obesity with serious comorbidity and body mass index (BMI) of 40.0 to 44.9 in adult 04/17/2016     REFERRING PROVIDER: Bissell, Jaclyn, PA-C  REFERRING DIAG: Pain in R lower limb, M79.604  THERAPY DIAG:  Pain in right thigh  Muscle weakness (generalized)  Difficulty in walking, not elsewhere classified  Stiffness of right knee, not elsewhere classified  Rationale for Evaluation and Treatment: Rehabilitation  ONSET DATE: 12/17/2023 and 01/02/2024  SUBJECTIVE:   SUBJECTIVE STATEMENT:  Feeling OK, its just been a rough week (not in terms of the leg). Still having some pain the first few steps getting OOB then gets better. Took heavy trash barrels  to the street the other day, no pain after just had to go slow    Eval: Pt was moving a wood pile and had soreness in R HS on 12/17/2023.  She lifted up her bed and tried to move the rug with her leg.  She had increased pain the following AM.  Pt went to [facility] and saw PA on 12/31/23.  Pt had x rays.  PA note indicated suspected strain of R biceps femoris.  Pt received crutches and PA prescribed prednisone  and robaxin .  He instructed her in using tylenol .  Pt also received home exercises including a standing stretch and supine heel slides.  She can't remember the 3rd exercise.  Pt states she felt better after using prednisone .    Pt was doing her exercises on 7/13 and felt and heard a pop when she stood up.  Pt had significant pain and felt unstable in knee.  She had increased pain with Wb'ing.  Pt saw PA on 01/03/24 and note indicated concern for a distal HS tear.  She was placed in a knee immobilizer and recommended to back of off stretching and activity.  PA ordered a MRI to rule out HS tear.  MRI is scheduled for 7/26 which pt may cancel if she is doing better.  She also refilled prednisone .   Pt states she started feeling better  Tuesday and feels much better this week.  She is tolerating increased Wb'ing through R LE and is using a KI.  Her pain has improved though R LE feels weak.  She has pain with pushing foot in shoe.  Pt is limited with mobility, standing, and walking.  Pt unable to carry objects with walking.  She is very limited with household chores.  Pt is extremely slow and careful with showering.  Pt has been using a shower chair.  Pt performs her stairs twice per day.         Pt was having knee pain from OA in November 2024 which is feeling fine now.  PERTINENT HISTORY: Bilat knee OA, L knee arthroscopy 2013 Hx of SI pain R Shoulder surgery HTN  PAIN:  NPRS:  0/10  now  Location:  distal R HS  PRECAUTIONS: Other: knee OA    WEIGHT BEARING RESTRICTIONS: No  FALLS:  Has  patient fallen in last 6 months? 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   PLOF: Independent  PATIENT GOALS: to remove crutches and walk normally   OBJECTIVE:  Note: Objective measures were completed at Evaluation unless otherwise noted.  DIAGNOSTIC FINDINGS: X rays per PA note:  tricompartmental degenerative changes present without acute displaced fracture or dislocation identified.  When compared to radiographs from Nov 2024, no significant changes identified.  PATIENT SURVEYS:  LEFS  Extreme difficulty/unable (0), Quite a bit of difficulty (1), Moderate difficulty (2), Little difficulty (3), No difficulty (4) Survey date:  01/06/24  Any of your usual work, housework or school activities 0  2. Usual hobbies, recreational or sporting activities 0  3. Getting into/out of the bath 3  4. Walking between rooms 3  5. Putting on socks/shoes 2  6. Squatting  0  7. Lifting an object, like a bag of groceries from the floor 0  8. Performing light activities around your home 0  9. Performing heavy activities around your home 0  10. Getting into/out of a car 2  11. Walking 2 blocks 0  12. Walking 1 mile 0  13. Going up/down 10 stairs (1 flight) 0  14. Standing for 1 hour 0  15.  sitting for 1 hour 0  16. Running on even ground 0  17. Running on uneven ground 0  18. Making sharp turns while running fast 0  19. Hopping  0  20. Rolling over in bed 4  Score total:  14/80     COGNITION: Overall cognitive status: Within functional limits for tasks assessed      EDEMA:  PT unable to view HS for any bruising due to pt's clothing.   PALPATION: TTP medial and lateral distal HS.   LOWER EXTREMITY ROM:   ROM Right eval Left Eval AROM  Hip flexion    Hip extension    Hip abduction    Hip adduction    Hip internal rotation     Hip external rotation    Knee flexion 109 PROM 129  Knee extension 0 deg in resting 7  Ankle dorsiflexion    Ankle plantarflexion    Ankle inversion    Ankle eversion     (Blank rows = not tested)  LOWER EXTREMITY MMT:  MMT Right eval Left eval  Hip flexion    Hip extension    Hip abduction  Hip adduction    Hip internal rotation    Hip external rotation    Knee flexion    Knee extension    Ankle dorsiflexion    Ankle plantarflexion    Ankle inversion    Ankle eversion     (Blank rows = not tested)   GAIT: Comments: Pt ambulated with bilat crutches with KI a step through gait pattern.  Pt ambulated without crutches with KI and felt some twinges with gait.                                                                                                                                TREATMENT:    02/18/24  Scifit bike L4x5 minutes Gait training without crutch long loop x2 heel toe pattern- some tightness in distal HS but no pain, less than 1/10 discomfort    Shuttle BLE press 87# x12 Shuttle LE press single leg 43# x12 B Walking bridges x10 Staggered bridges x10 B Prone HS curls R 6.5# 2x12 Prone hip extensions 4# x12 R Forward step ups 6 inch box x15 B Lateral step ups 6 inch box x15 B Hip hikes x10 B Hip hikes + swing x10 B    02/15/24  Scifit bike L3x5 minutes  Roller to R HS Prone HSC x10 Prone eccentric HS with manual resistance 2x5 Prone HS isometrics multiangle  Prone hip extension with knee flexed x10 Prone hip extension with HSC 2x10 Sidelying hip ABD red TB x12 B Forward step ups 6 inch box x12 B Lateral step ups 6 inch box x12 B Squats at bar x12 self selected depth Shuttle BLE press 75# 2x20   02/11/24  Scifit bike L4x6 minutes for w/u   Shuttle BLE press 75# x12 Shuttle single leg press 37# R LE 2x12, L LE 2x12 for active recovery between RLE sets  Forward step ups 4 inch box x12 B Lateral step ups 4 inch box x12 B Squats  at bar x12 self selected depth Bridges + ABD red TB x15 Sidelying hip ABD red TB x12 B Attempted single leg bridges unable R (no pain just weakness) Walking bridge x5  HS stretches 2x30 seconds B       8/19: Nu-step L4 x21min  Prone HSC 2# x10ea Prone hip extension 2x10ea HS isometric hooklying 5 2x10ea S/l hip aduction 2x10ea Prone TKE 5 x15bil Hip flexor stretch at stairs 30sec x2ea Gait with cues for hip extension and TKE in stance phase Sit to stands on plinth 2x10 HS stretch with strap 3x20sec bil LAQ 4# 3 2x10   8/14: Recumbent bike L2 x79min Prone HSC 2# x10 Seated HSC YTB 2x10ea Prone hip extension 2x10ea S/l hip aduction 2x10ea Prone TKE 5 x15bil Hip flexor stretch at stairs 30sec x2ea Gait with cues for hip extension and TKE in stance phase Hip hinge 2x10 at wall Sit to stands on plinth x10 ITB stretch with strap 3x20sec R  8/12: Recumbent bike L2 x45min Prone HSC 2x10 Seated HSC YTB 2x10ea Prone hip extension 2x10ea Thomas stretch bil Prone TKE 5 x15bil Hip flexor stretch at stairs 30sec x2ea Gait with cues for hip extension and TKE in stance phase Hip hinge x10 at wall   8/4: Nu-step  LE's only L5 x27min STM to distal HS in prone (pilow under hips) Prone HSC 2x10 Prone hip extension 2x10ea Long sit HSS 30sec x3R Gait with single crutch  x247ft Standing HR 2x15 Standing hip abd 2x10ea/bil Standing marching 2x10 Hip hinge at back of bike- x10  7/29: Nu-step UE and LE L5 x67min STM to distal HS in prone (pilow under hips) Prone HSC 2x10 Prone hip extension 2x10ea Long sit HSS 30sec x3R Gait with single crutch  2x152ft Standing HR 2x15 Standing hip 3way 2x10ea/bil Standing marching 2x10 Standing adductor stretch  7/23: STM to distal HS in prone (pilow under hips) Prone HSC x10 Prone hip extension x10ea Gait with bil crutches and immobilizer doffed 2x143ft Gait along rail with unilateral support x1 lap SKTC with leg on red  physioball 2x10 Submax isometrics with leg on physioball 5 x10 Nu-step UE and LE L1 x81min   Eval: Pt performed seated submaximal mid range HS isometric with 5 sec hold x 10 reps.  Pt denied any pain.  Pt received a HEP handout and was educated in correct form and appropriate frequency.  PT instructed pt to stop exercise if she has pain.  PATIENT EDUCATION:  Education details: relevant anatomy, rationale of interventions, POC, dx, objective findings, prognosis, gait, crutch usage, HEP, ice usage, and positioning.  PT instructed pt to not stretch HS. PT answered questions. Person educated: Patient Education method: Programmer, multimedia, Demonstration, Verbal cues, and Handouts Education comprehension: verbalized understanding, returned demonstration, verbal cues required, and needs further education  HOME EXERCISE PROGRAM: Seated mid range submaximal HS isometric once per day, 2 sets of 5-10 reps with 5 sec hold  ASSESSMENT:  CLINICAL IMPRESSION:  Continued to progress functional interventions today as tolerated- we discussed frequency today, I recommend 2x/week moving forward due to how stubborn this injury has been, also to help reduce chances of re-injury as much as possible. I do think hip ABD weakness is playing a large role in her pain and ongoing HS issues given pain patterns with gait without crutch and hip hikes today.       Eval: Patient is a 58 y.o. female with a dx of pain in R lower limb with suspected HS strain.  Pt is in [place] and ambulating with bilat crutches.  She has been feeling better this week since her exacerbation on Sunday.  Pt states her leg felt unstable though this is improving  as she is able to increase her Wb'ing through her R LE in [place] with gait.  She reports having weakness in R LE.  Pt is limited and has difficulty with her self care activities and ADLs/IADLs.  Pt is limited with standing and her normal functional mobility skills including walking and stairs.  Her pain  is located in distal HS and she is tender to palpate medial and lateral distal HS.  Pt should benefit from skilled PT to address impairments and assist in returning pt to PLOF.   OBJECTIVE IMPAIRMENTS: Abnormal gait, decreased activity tolerance, decreased endurance, decreased mobility, difficulty walking, decreased ROM, decreased strength, hypomobility, increased fascial restrictions, impaired flexibility, and pain.   ACTIVITY LIMITATIONS: carrying, bending, standing, squatting, stairs, transfers, and locomotion level, dressing, bathing  PARTICIPATION  LIMITATIONS: meal prep, cleaning, laundry, driving, shopping, and community activity  PERSONAL FACTORS: 1-2 comorbidities: bilat knee OA, Hx of SI pain are also affecting patient's functional outcome.   REHAB POTENTIAL: Good  CLINICAL DECISION MAKING: Stable/uncomplicated  EVALUATION COMPLEXITY: Low   GOALS  SHORT TERM GOALS:   Pt will be independent and compliant with HEP for improved pain, strength, ROM, and function.  Baseline: Goal status: MET 8/12 Target date:  01/27/2024  2.  Pt will wean out of KI and off crutches without adverse effects.   Baseline:  Goal status: IN PROGRESS 8/12 Target date:  01/27/2024  3.  Pt will report she is able to ambulate community distance without any feelings of instability.  Baseline:  Goal status: IN PROGRESS 8/12 Target date: 02/03/2024  4.  Pt will ambulate with a normalized heel to toe gait pattern without limping.  Baseline:  Goal status IN PROGRESS 8/12 Target date:  02/10/2024  5.  Pt will be able to perform her self care activities and shower transfers without increased pain and difficulty.  Baseline:  Goal status: IN PROGRESS 8/12 Target date:  02/17/2024  6.  Pt will be able to tolerate exercises without adverse effects for improved strength and proprioception to assist with returning to PLOF.  Baseline:  Goal status: INITIAL Target date:  02/10/2024  LONG TERM GOALS: Target  date: 03/02/2024  Pt will be able to perform stairs with a reciprocal gait with good control without difficulty.  Baseline:  Goal status: INITIAL  2.  Pt will be able to perform her household chores without significant pain and difficulty.  Baseline:  Goal status: INITIAL  3.  Pt will ambulate extended community distance without significant pain and difficulty.  Baseline:  Goal status: INITIAL  4.  Pt will demo at least a 4 to 4+/5 MMT strength in R HS for improved performance of functional mobility and tolerance of daily mobility.  Baseline:  Goal status: INITIAL     PLAN:  PT FREQUENCY: 1x/wk for 2-3 weeks and 2x/wk afterwards  PT DURATION: 8 weeks  PLANNED INTERVENTIONS: 97164- PT Re-evaluation, 97750- Physical Performance Testing, 97110-Therapeutic exercises, 97530- Therapeutic activity, [id number]- Neuromuscular re-education, 97535- Self Care, [id number]- Manual therapy, [phone]- Gait training, [phone]- Aquatic Therapy, [phone]- Electrical stimulation (unattended), [phone]- Electrical stimulation (manual), [id number]- Ultrasound, Patient/Family education, Balance training, Stair training, Taping, Joint mobilization, Spinal mobilization, DME instructions, Cryotherapy, and Moist heat  PLAN FOR NEXT SESSION: Cont per protocol, healing constrains, and sx's.  Gait training, work towards getting away from crutch as able.  Ice as needed.   Josette Rough, PT, DPT 02/18/24 2:29 PM

## 2024-02-21 NOTE — Therapy (Signed)
 OUTPATIENT PHYSICAL THERAPY LOWER EXTREMITY TREATMENT  PROGRESS NOTE   Patient Name: Amanda Mack MRN: [id number] DOB:[birth year], 58 y.o., female Today's Date: 02/23/2024  END OF SESSION:  PT End of Session - 02/23/24 1741     Visit Number 11    Number of Visits 21    Date for PT Re-Evaluation 03/28/24    Authorization Type [employer]    PT Start Time 1640    PT Stop Time 1730    PT Time Calculation (min) 50 min    Activity Tolerance Patient tolerated treatment well    Behavior During Therapy WFL for tasks assessed/performed                    Past Medical History:  Diagnosis Date   Adopted    Allergy    Arthritis    GERD (gastroesophageal reflux disease)    H/O LEEP 1990   Hypertension    Migraines    Routine general medical examination at a health care facility 03/09/2023   Past Surgical History:  Procedure Laterality Date   KNEE ARTHROSCOPY Left 2019   SHOULDER SURGERY     Patient Active Problem List   Diagnosis Date Noted   Routine general medical examination at a health care facility 03/09/2023   Menopausal symptoms 08/13/2022   Hyperlipidemia 09/23/2020   Vitamin D  deficiency, unspecified 03/01/2019   Sinus tachycardia 09/29/2016   Essential hypertension 04/17/2016   Class 3 severe obesity with serious comorbidity and body mass index (BMI) of 40.0 to 44.9 in adult 04/17/2016     REFERRING PROVIDER: Bissell, Jaclyn, PA-C  REFERRING DIAG: Pain in R lower limb, M79.604  THERAPY DIAG:  Pain in right thigh  Muscle weakness (generalized)  Difficulty in walking, not elsewhere classified  Stiffness of right knee, not elsewhere classified  Rationale for Evaluation and Treatment: Rehabilitation  ONSET DATE: 12/17/2023 and 01/02/2024  SUBJECTIVE:   SUBJECTIVE STATEMENT:  Pt states she is not having the intense pain anymore.  Pt ambulates without AD at home.  She has improved with stairs; she performs stairs with a reciprocal gait ascending  and step to gait descending.   Pt states she had increased swelling on Saturday.  Her knee felt tight.  Pt reports she is able to perform her normal household chores without significant difficulty or pain, just gets tired with standing.  Pt unable to perform her yard work.     PERTINENT HISTORY: Bilat knee OA, L knee arthroscopy 2013 Hx of SI pain R Shoulder surgery HTN  PAIN:  NPRS:  0/10  now, 1/10 worst Location:  distal R HS   Type:  Achy   PRECAUTIONS: Other: knee OA    WEIGHT BEARING RESTRICTIONS: No  FALLS:  Has patient fallen in last 6 months? 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   PLOF: Independent  PATIENT GOALS: to remove crutches and walk normally   OBJECTIVE:  Note: Objective measures were completed at Evaluation unless otherwise noted.  DIAGNOSTIC FINDINGS: X rays per PA note:  tricompartmental degenerative changes present without acute displaced fracture or dislocation identified.  When compared to radiographs from Nov 2024, no significant changes identified.  PATIENT SURVEYS:  LEFS  Extreme difficulty/unable (0), Quite a bit of difficulty (1), Moderate difficulty (2), Little difficulty (3), No difficulty (4) Survey date:  01/06/24 02/22/24  Any  of your usual work, housework or school activities 0 3  2. Usual hobbies, recreational or sporting activities 0 0  3. Getting into/out of the bath 3 4  4. Walking between rooms 3 3.5  5. Putting on socks/shoes 2 4  6. Squatting  0 0  7. Lifting an object, like a bag of groceries from the floor 0 3  8. Performing light activities around your home 0 3.5  9. Performing heavy activities around your home 0 1.5  10. Getting into/out of a car 2 4  11. Walking 2 blocks 0 1  12. Walking 1 mile 0 0  13. Going up/down 10 stairs (1 flight) 0 3.5  14. Standing  for 1 hour 0 2  15.  sitting for 1 hour 0 4  16. Running on even ground 0 0  17. Running on uneven ground 0 0  18. Making sharp turns while running fast 0 0  19. Hopping  0 0  20. Rolling over in bed 4 4  Score total:  14/80 40/80     COGNITION: Overall cognitive status: Within functional limits for tasks assessed      PALPATION: TTP medial and lateral distal HS.   LOWER EXTREMITY ROM:   ROM Right eval Left Eval AROM Right 9/2  Hip flexion     Hip extension     Hip abduction     Hip adduction     Hip internal rotation     Hip external rotation     Knee flexion 109 PROM 129 121  Knee extension 0 deg in resting 7 3  Ankle dorsiflexion     Ankle plantarflexion     Ankle inversion     Ankle eversion      (Blank rows = not tested)  LOWER EXTREMITY MMT:  MMT Right eval Left eval Right 9/2  Hip flexion     Hip extension     Hip abduction   5/5  Hip adduction     Hip internal rotation     Hip external rotation     Knee flexion   Pt able to perform prone knee flexion AROM without pain.  Pt tolerated minimal resistance well without pain.  Knee extension     Ankle dorsiflexion     Ankle plantarflexion     Ankle inversion     Ankle eversion      (Blank rows = not tested)   GAIT: Comments: Pt ambulated with and without crutch.  Pt leans toward her crutch when ambulating with crutch.  Pt applies good weight through R LE and is not limping.  Pt lacks TKE with gait.  Pt ambulated well without crutch.                                                                                                                                  TREATMENT:    02/22/24: Scift bike x 5 mins  Bridges x 10 Walking bridges x 10 Prone eccentric HS with manual resistance 2x5 Sidestepping x 3 laps at rail Braiding x 3 laps at rail  PT assessed gait, palpation, ROM, and strength.  See above. Pt completed LEFS.  See above   02/18/24  Scifit bike L4x5 minutes Gait training without  crutch long loop x2 heel toe pattern- some tightness in distal HS but no pain, less than 1/10 discomfort    Shuttle BLE press 87# x12 Shuttle LE press single leg 43# x12 B Walking bridges x10 Staggered bridges x10 B Prone HS curls R 6.5# 2x12 Prone hip extensions 4# x12 R Forward step ups 6 inch box x15 B Lateral step ups 6 inch box x15 B Hip hikes x10 B Hip hikes + swing x10 B    02/15/24  Scifit bike L3x5 minutes  Roller to R HS Prone HSC x10 Prone eccentric HS with manual resistance 2x5 Prone HS isometrics multiangle  Prone hip extension with knee flexed x10 Prone hip extension with HSC 2x10 Sidelying hip ABD red TB x12 B Forward step ups 6 inch box x12 B Lateral step ups 6 inch box x12 B Squats at bar x12 self selected depth Shuttle BLE press 75# 2x20   02/11/24  Scifit bike L4x6 minutes for w/u   Shuttle BLE press 75# x12 Shuttle single leg press 37# R LE 2x12, L LE 2x12 for active recovery between RLE sets  Forward step ups 4 inch box x12 B Lateral step ups 4 inch box x12 B Squats at bar x12 self selected depth Bridges + ABD red TB x15 Sidelying hip ABD red TB x12 B Attempted single leg bridges unable R (no pain just weakness) Walking bridge x5  HS stretches 2x30 seconds B       8/19: Nu-step L4 x71min  Prone HSC 2# x10ea Prone hip extension 2x10ea HS isometric hooklying 5 2x10ea S/l hip aduction 2x10ea Prone TKE 5 x15bil Hip flexor stretch at stairs 30sec x2ea Gait with cues for hip extension and TKE in stance phase Sit to stands on plinth 2x10 HS stretch with strap 3x20sec bil LAQ 4# 3 2x10   8/14: Recumbent bike L2 x43min Prone HSC 2# x10 Seated HSC YTB 2x10ea Prone hip extension 2x10ea S/l hip aduction 2x10ea Prone TKE 5 x15bil Hip flexor stretch at stairs 30sec x2ea Gait with cues for hip extension and TKE in stance phase Hip hinge 2x10 at wall Sit to stands on plinth x10 ITB stretch with strap 3x20sec R   8/12: Recumbent  bike L2 x43min Prone HSC 2x10 Seated HSC YTB 2x10ea Prone hip extension 2x10ea Thomas stretch bil Prone TKE 5 x15bil Hip flexor stretch at stairs 30sec x2ea Gait with cues for hip extension and TKE in stance phase Hip hinge x10 at wall   8/4: Nu-step  LE's only L5 x42min STM to distal HS in prone (pilow under hips) Prone HSC 2x10 Prone hip extension 2x10ea Long sit HSS 30sec x3R Gait with single crutch  x255ft Standing HR 2x15 Standing hip abd 2x10ea/bil Standing marching 2x10 Hip hinge at back of bike- x10   PATIENT EDUCATION:  Education details: relevant anatomy, rationale of interventions, POC, dx, objective findings, progress, prognosis, gait, crutch usage, HEP, and ice usage.  PT answered questions. Person educated: Patient Education method: Explanation, Demonstration, Verbal cues, and Handouts Education comprehension: verbalized understanding, returned demonstration, verbal cues required, and needs further education  HOME EXERCISE PROGRAM: Seated mid range submaximal HS isometric once per day, 2 sets  of 5-10 reps with 5 sec hold  ASSESSMENT:  CLINICAL IMPRESSION:  Pt is making progress in all areas.  She has much improved pain and is not having the intense pain anymore.  Pt is improving with gait and demonstrates improved stability with gait and quality of gait.  Pt ambulates without AD at home and ambulated well without crutch in the hallway.  Pt reports improved performance of stairs though continues to have difficulty with descending stairs.  Pt reports recently having tightness in knee.  Pt demonstrates improved flexion ROM though worse extension.  She lacks TKE with gait.  Pt reports she is able to perform her normal household chores without significant difficulty or pain, though unable to perform yard work.  Pt is improving with HS strength as evidenced by performance of exercises and tolerance with minimal manual resistance.  She demonstrates 5/5 hip abd strength.  Pt  demonstrates clinically significant improvement in self perceived disability with LEFS improving from 14/80 initially to 40/80 currently.  Pt met STG's #1,5,6 and partially met #2,3 and met LTG #2.  Pt responded well to treatment and had no increased pain after treatment.  She should continued to benefit from continued skilled PT to address ongoing goals and assist in returning pt to PLOF.   OBJECTIVE IMPAIRMENTS: Abnormal gait, decreased activity tolerance, decreased endurance, decreased mobility, difficulty walking, decreased ROM, decreased strength, hypomobility, increased fascial restrictions, impaired flexibility, and pain.   ACTIVITY LIMITATIONS: carrying, bending, standing, squatting, stairs, transfers, and locomotion level, dressing, bathing  PARTICIPATION LIMITATIONS: meal prep, cleaning, laundry, driving, shopping, and community activity  PERSONAL FACTORS: 1-2 comorbidities: bilat knee OA, Hx of SI pain are also affecting patient's functional outcome.   REHAB POTENTIAL: Good  CLINICAL DECISION MAKING: Stable/uncomplicated  EVALUATION COMPLEXITY: Low   GOALS  SHORT TERM GOALS:   Pt will be independent and compliant with HEP for improved pain, strength, ROM, and function.  Baseline: Goal status: MET 8/12 Target date:  01/27/2024  2.  Pt will wean out of KI and off crutches without adverse effects.   Baseline:  Goal status: PARTIALLY MET  9/2 Target date:  01/27/2024  3.  Pt will report she is able to ambulate community distance without any feelings of instability.  Baseline:  Goal status:  PARTIALLY MET  9/2; no feelings of instability, but using crutch Target date: 02/03/2024  4.  Pt will ambulate with a normalized heel to toe gait pattern without limping.  Baseline:  Goal status:  PROGRESSING  9/2 Target date:  02/10/2024  5.  Pt will be able to perform her self care activities and shower transfers without increased pain and difficulty.  Baseline:  Goal status: GOAL  MET   9/2 Target date:  02/17/2024  6.  Pt will be able to tolerate exercises without adverse effects for improved strength and proprioception to assist with returning to PLOF.  Baseline:  Goal status: GOAL MET  9/2 Target date:  02/10/2024  LONG TERM GOALS: Target date: 03/28/2024  Pt will be able to perform stairs with a reciprocal gait with good control without difficulty.  Baseline:  Goal status: PROGRESSING  2.  Pt will be able to perform her household chores without significant pain and difficulty.  Baseline:  Goal status: GOAL MET  9/2  3.  Pt will ambulate extended community distance without significant pain and difficulty.  Baseline:  Goal status: ONGOING  4.  Pt will demo at least a 4 to 4+/5 MMT strength in R  HS for improved performance of functional mobility and tolerance of daily mobility.  Baseline:  Goal status: PROGRESSING     PLAN:  PT FREQUENCY: 2x/wk   PT DURATION: 5 weeks  PLANNED INTERVENTIONS: [id number]- PT Re-evaluation, 97750- Physical Performance Testing, 97110-Therapeutic exercises, 97530- Therapeutic activity, [id number]- Neuromuscular re-education, 97535- Self Care, [id number]- Manual therapy, [phone]- Gait training, [phone]- Aquatic Therapy, [phone]- Electrical stimulation (unattended), [phone]- Electrical stimulation (manual), [id number]- Ultrasound, Patient/Family education, Balance training, Stair training, Taping, Joint mobilization, Spinal mobilization, DME instructions, Cryotherapy, and Moist heat  PLAN FOR NEXT SESSION: Cont per protocol, healing constrains, and sx's.  Gait training, work towards getting away from crutch as able.  Ice as needed.   Leigh Minerva III PT, DPT 02/23/24 11:53 PM

## 2024-02-22 ENCOUNTER — Ambulatory Visit (HOSPITAL_BASED_OUTPATIENT_CLINIC_OR_DEPARTMENT_OTHER): Attending: Orthopedic Surgery | Admitting: Physical Therapy

## 2024-02-22 DIAGNOSIS — M25661 Stiffness of right knee, not elsewhere classified: Secondary | ICD-10-CM | POA: Insufficient documentation

## 2024-02-22 DIAGNOSIS — M6281 Muscle weakness (generalized): Secondary | ICD-10-CM | POA: Insufficient documentation

## 2024-02-22 DIAGNOSIS — R262 Difficulty in walking, not elsewhere classified: Secondary | ICD-10-CM | POA: Insufficient documentation

## 2024-02-22 DIAGNOSIS — M79651 Pain in right thigh: Secondary | ICD-10-CM | POA: Insufficient documentation

## 2024-02-23 ENCOUNTER — Encounter (HOSPITAL_BASED_OUTPATIENT_CLINIC_OR_DEPARTMENT_OTHER): Payer: Self-pay | Admitting: Physical Therapy

## 2024-02-23 NOTE — Addendum Note (Signed)
 Addended by: MARGRETTE LEIGH RAMAN on: 02/23/2024 11:54 PM   Modules accepted: Orders

## 2024-02-25 ENCOUNTER — Encounter (HOSPITAL_BASED_OUTPATIENT_CLINIC_OR_DEPARTMENT_OTHER): Payer: Self-pay

## 2024-02-25 ENCOUNTER — Ambulatory Visit (HOSPITAL_BASED_OUTPATIENT_CLINIC_OR_DEPARTMENT_OTHER)

## 2024-02-25 DIAGNOSIS — M25661 Stiffness of right knee, not elsewhere classified: Secondary | ICD-10-CM

## 2024-02-25 DIAGNOSIS — R262 Difficulty in walking, not elsewhere classified: Secondary | ICD-10-CM

## 2024-02-25 DIAGNOSIS — M6281 Muscle weakness (generalized): Secondary | ICD-10-CM

## 2024-02-25 DIAGNOSIS — M79651 Pain in right thigh: Secondary | ICD-10-CM

## 2024-02-25 NOTE — Therapy (Signed)
 OUTPATIENT PHYSICAL THERAPY LOWER EXTREMITY TREATMENT  PROGRESS NOTE   Patient Name: Amanda Mack MRN: [id number] DOB:02/27/1966, 58 y.o., female Today's Date: 02/25/2024  END OF SESSION:  PT End of Session - 02/25/24 1402     Visit Number 12    Number of Visits 21    Date for PT Re-Evaluation 03/28/24    Authorization Type [employer]    PT Start Time 1348    PT Stop Time 1430    PT Time Calculation (min) 42 min    Activity Tolerance Patient tolerated treatment well    Behavior During Therapy WFL for tasks assessed/performed                     Past Medical History:  Diagnosis Date   Adopted    Allergy    Arthritis    GERD (gastroesophageal reflux disease)    H/O LEEP 1990   Hypertension    Migraines    Routine general medical examination at a health care facility 03/09/2023   Past Surgical History:  Procedure Laterality Date   KNEE ARTHROSCOPY Left 2019   SHOULDER SURGERY     Patient Active Problem List   Diagnosis Date Noted   Routine general medical examination at a health care facility 03/09/2023   Menopausal symptoms 08/13/2022   Hyperlipidemia 09/23/2020   Vitamin D  deficiency, unspecified 03/01/2019   Sinus tachycardia 09/29/2016   Essential hypertension 04/17/2016   Class 3 severe obesity with serious comorbidity and body mass index (BMI) of 40.0 to 44.9 in adult 04/17/2016     REFERRING PROVIDER: Bissell, Jaclyn, PA-C  REFERRING DIAG: Pain in R lower limb, M79.604  THERAPY DIAG:  Pain in right thigh  Muscle weakness (generalized)  Difficulty in walking, not elsewhere classified  Stiffness of right knee, not elsewhere classified  Rationale for Evaluation and Treatment: Rehabilitation  ONSET DATE: 12/17/2023 and 01/02/2024  SUBJECTIVE:   SUBJECTIVE STATEMENT:  Pt reports she has been going between using 1 and 2 crutches. R knee is stiff but not painful.     PERTINENT HISTORY: Bilat knee OA, L knee arthroscopy 2013 Hx of  SI pain R Shoulder surgery HTN  PAIN:  NPRS:  0/10  now, 1/10 worst Location:  distal R HS   Type:  Achy   PRECAUTIONS: Other: knee OA    WEIGHT BEARING RESTRICTIONS: No  FALLS:  Has patient fallen in last 6 months? 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   PLOF: Independent  PATIENT GOALS: to remove crutches and walk normally   OBJECTIVE:  Note: Objective measures were completed at Evaluation unless otherwise noted.  DIAGNOSTIC FINDINGS: X rays per PA note:  tricompartmental degenerative changes present without acute displaced fracture or dislocation identified.  When compared to radiographs from Nov 2024, no significant changes identified.  PATIENT SURVEYS:  LEFS  Extreme difficulty/unable (0), Quite a bit of difficulty (1), Moderate difficulty (2), Little difficulty (3), No difficulty (4) Survey date:  01/06/24 02/22/24  Any of your usual work, housework or school activities 0 3  2. Usual hobbies, recreational or sporting activities 0 0  3. Getting into/out of the bath 3 4  4. Walking between rooms 3 3.5  5. Putting on socks/shoes 2 4  6. Squatting  0 0  7. Lifting an object, like a bag of groceries from the  floor 0 3  8. Performing light activities around your home 0 3.5  9. Performing heavy activities around your home 0 1.5  10. Getting into/out of a car 2 4  11. Walking 2 blocks 0 1  12. Walking 1 mile 0 0  13. Going up/down 10 stairs (1 flight) 0 3.5  14. Standing for 1 hour 0 2  15.  sitting for 1 hour 0 4  16. Running on even ground 0 0  17. Running on uneven ground 0 0  18. Making sharp turns while running fast 0 0  19. Hopping  0 0  20. Rolling over in bed 4 4  Score total:  14/80 40/80     COGNITION: Overall cognitive status: Within functional limits for tasks assessed      PALPATION: TTP  medial and lateral distal HS.   LOWER EXTREMITY ROM:   ROM Right eval Left Eval AROM Right 9/2  Hip flexion     Hip extension     Hip abduction     Hip adduction     Hip internal rotation     Hip external rotation     Knee flexion 109 PROM 129 121  Knee extension 0 deg in resting 7 3  Ankle dorsiflexion     Ankle plantarflexion     Ankle inversion     Ankle eversion      (Blank rows = not tested)  LOWER EXTREMITY MMT:  MMT Right eval Left eval Right 9/2  Hip flexion     Hip extension     Hip abduction   5/5  Hip adduction     Hip internal rotation     Hip external rotation     Knee flexion   Pt able to perform prone knee flexion AROM without pain.  Pt tolerated minimal resistance well without pain.  Knee extension     Ankle dorsiflexion     Ankle plantarflexion     Ankle inversion     Ankle eversion      (Blank rows = not tested)   GAIT: Comments: Pt ambulated with and without crutch.  Pt leans toward her crutch when ambulating with crutch.  Pt applies good weight through R LE and is not limping.  Pt lacks TKE with gait.  Pt ambulated well without crutch.                                                                                                                                  TREATMENT:     02/25/24  Scifit bike L5x5 minutes Gait training without crutch 2X 240 feet  Shuttle BLE press 87# 2x15ea Shuttle LE press single leg 50# 2x15ea Walking bridges 2x10 Staggered bridges 2x10 B Prone HS curls R 7.5# 2x12 Thomas stretch 2x 20sec Hip flexor stretch at stair 30 seconds x 2 each Hamstring stretch at stair 30 seconds x 2 each  02/22/24: Scift bike  x 5 mins Bridges x 10 Walking bridges x 10 Prone eccentric HS with manual resistance 2x5 Sidestepping x 3 laps at rail Braiding x 3 laps at rail  PT assessed gait, palpation, ROM, and strength.  See above. Pt completed LEFS.  See above   02/18/24  Scifit bike L4x5 minutes Gait training without  crutch long loop x2 heel toe pattern- some tightness in distal HS but no pain, less than 1/10 discomfort    Shuttle BLE press 87# x12 Shuttle LE press single leg 43# x12 B Walking bridges x10 Staggered bridges x10 B Prone HS curls R 6.5# 2x12 Prone hip extensions 4# x12 R Forward step ups 6 inch box x15 B Lateral step ups 6 inch box x15 B Hip hikes x10 B Hip hikes + swing x10 B    02/15/24  Scifit bike L3x5 minutes  Roller to R HS Prone HSC x10 Prone eccentric HS with manual resistance 2x5 Prone HS isometrics multiangle  Prone hip extension with knee flexed x10 Prone hip extension with HSC 2x10 Sidelying hip ABD red TB x12 B Forward step ups 6 inch box x12 B Lateral step ups 6 inch box x12 B Squats at bar x12 self selected depth Shuttle BLE press 75# 2x20   02/11/24  Scifit bike L4x6 minutes for w/u   Shuttle BLE press 75# x12 Shuttle single leg press 37# R LE 2x12, L LE 2x12 for active recovery between RLE sets  Forward step ups 4 inch box x12 B Lateral step ups 4 inch box x12 B Squats at bar x12 self selected depth Bridges + ABD red TB x15 Sidelying hip ABD red TB x12 B Attempted single leg bridges unable R (no pain just weakness) Walking bridge x5  HS stretches 2x30 seconds B       8/19: Nu-step L4 x60min  Prone HSC 2# x10ea Prone hip extension 2x10ea HS isometric hooklying 5 2x10ea S/l hip aduction 2x10ea Prone TKE 5 x15bil Hip flexor stretch at stairs 30sec x2ea Gait with cues for hip extension and TKE in stance phase Sit to stands on plinth 2x10 HS stretch with strap 3x20sec bil LAQ 4# 3 2x10   8/14: Recumbent bike L2 x43min Prone HSC 2# x10 Seated HSC YTB 2x10ea Prone hip extension 2x10ea S/l hip aduction 2x10ea Prone TKE 5 x15bil Hip flexor stretch at stairs 30sec x2ea Gait with cues for hip extension and TKE in stance phase Hip hinge 2x10 at wall Sit to stands on plinth x10 ITB stretch with strap 3x20sec R   8/12: Recumbent  bike L2 x72min Prone HSC 2x10 Seated HSC YTB 2x10ea Prone hip extension 2x10ea Thomas stretch bil Prone TKE 5 x15bil Hip flexor stretch at stairs 30sec x2ea Gait with cues for hip extension and TKE in stance phase Hip hinge x10 at wall   8/4: Nu-step  LE's only L5 x6min STM to distal HS in prone (pilow under hips) Prone HSC 2x10 Prone hip extension 2x10ea Long sit HSS 30sec x3R Gait with single crutch  x241ft Standing HR 2x15 Standing hip abd 2x10ea/bil Standing marching 2x10 Hip hinge at back of bike- x10   PATIENT EDUCATION:  Education details: relevant anatomy, rationale of interventions, POC, dx, objective findings, progress, prognosis, gait, crutch usage, HEP, and ice usage.  PT answered questions. Person educated: Patient Education method: Explanation, Demonstration, Verbal cues, and Handouts Education comprehension: verbalized understanding, returned demonstration, verbal cues required, and needs further education  HOME EXERCISE PROGRAM: Seated mid range submaximal HS isometric once per  day, 2 sets of 5-10 reps with 5 sec hold  ASSESSMENT:  CLINICAL IMPRESSION:  Good tolerance for stretching and strengthening program today.  She does have some complaints of patellofemoral area discomfort throughout session.  Some distal hamstring discomfort noted with standing hip flexor stretching.  Gait quality has been improving with patient able to demonstrate improved hip extension and heel contact/toe off.  Able to increase resistance with shuttle press today without complaint of increased pain.  Patient will benefit from continued PT to work on concentric/eccentric strengthening as well as range of motion and stability.  Will continue to progress as tolerated.   OBJECTIVE IMPAIRMENTS: Abnormal gait, decreased activity tolerance, decreased endurance, decreased mobility, difficulty walking, decreased ROM, decreased strength, hypomobility, increased fascial restrictions, impaired  flexibility, and pain.   ACTIVITY LIMITATIONS: carrying, bending, standing, squatting, stairs, transfers, and locomotion level, dressing, bathing  PARTICIPATION LIMITATIONS: meal prep, cleaning, laundry, driving, shopping, and community activity  PERSONAL FACTORS: 1-2 comorbidities: bilat knee OA, Hx of SI pain are also affecting patient's functional outcome.   REHAB POTENTIAL: Good  CLINICAL DECISION MAKING: Stable/uncomplicated  EVALUATION COMPLEXITY: Low   GOALS  SHORT TERM GOALS:   Pt will be independent and compliant with HEP for improved pain, strength, ROM, and function.  Baseline: Goal status: MET 8/12 Target date:  01/27/2024  2.  Pt will wean out of KI and off crutches without adverse effects.   Baseline:  Goal status: PARTIALLY MET  9/2 Target date:  01/27/2024  3.  Pt will report she is able to ambulate community distance without any feelings of instability.  Baseline:  Goal status:  PARTIALLY MET  9/2; no feelings of instability, but using crutch Target date: 02/03/2024  4.  Pt will ambulate with a normalized heel to toe gait pattern without limping.  Baseline:  Goal status:  PROGRESSING  9/2 Target date:  02/10/2024  5.  Pt will be able to perform her self care activities and shower transfers without increased pain and difficulty.  Baseline:  Goal status: GOAL MET   9/2 Target date:  02/17/2024  6.  Pt will be able to tolerate exercises without adverse effects for improved strength and proprioception to assist with returning to PLOF.  Baseline:  Goal status: GOAL MET  9/2 Target date:  02/10/2024  LONG TERM GOALS: Target date: 03/28/2024  Pt will be able to perform stairs with a reciprocal gait with good control without difficulty.  Baseline:  Goal status: PROGRESSING  2.  Pt will be able to perform her household chores without significant pain and difficulty.  Baseline:  Goal status: GOAL MET  9/2  3.  Pt will ambulate extended community distance  without significant pain and difficulty.  Baseline:  Goal status: ONGOING  4.  Pt will demo at least a 4 to 4+/5 MMT strength in R HS for improved performance of functional mobility and tolerance of daily mobility.  Baseline:  Goal status: PROGRESSING     PLAN:  PT FREQUENCY: 2x/wk   PT DURATION: 5 weeks  PLANNED INTERVENTIONS: 97164- PT Re-evaluation, 97750- Physical Performance Testing, 97110-Therapeutic exercises, 97530- Therapeutic activity, [id number]- Neuromuscular re-education, 97535- Self Care, [id number]- Manual therapy, [phone]- Gait training, [phone]- Aquatic Therapy, [phone]- Electrical stimulation (unattended), [phone]- Electrical stimulation (manual), [id number]- Ultrasound, Patient/Family education, Balance training, Stair training, Taping, Joint mobilization, Spinal mobilization, DME instructions, Cryotherapy, and Moist heat  PLAN FOR NEXT SESSION: Cont per protocol, healing constrains, and sx's.  Gait training, work towards getting away from crutch as  able.  Ice as needed.   Asberry Rodes, PTA  02/25/24 2:51 PM

## 2024-02-28 ENCOUNTER — Encounter (HOSPITAL_BASED_OUTPATIENT_CLINIC_OR_DEPARTMENT_OTHER): Payer: Self-pay | Admitting: Obstetrics & Gynecology

## 2024-02-28 ENCOUNTER — Other Ambulatory Visit (HOSPITAL_COMMUNITY)
Admission: RE | Admit: 2024-02-28 | Discharge: 2024-02-28 | Disposition: A | Discharge status: Home / Self Care | Source: Ambulatory Visit | Attending: Obstetrics & Gynecology | Admitting: Obstetrics & Gynecology

## 2024-02-28 ENCOUNTER — Other Ambulatory Visit (HOSPITAL_BASED_OUTPATIENT_CLINIC_OR_DEPARTMENT_OTHER): Payer: Self-pay

## 2024-02-28 ENCOUNTER — Ambulatory Visit (INDEPENDENT_AMBULATORY_CARE_PROVIDER_SITE_OTHER): Admitting: Obstetrics & Gynecology

## 2024-02-28 VITALS — BP 134/77 | HR 85 | Wt 248.6 lb

## 2024-02-28 DIAGNOSIS — D072 Carcinoma in situ of vagina: Secondary | ICD-10-CM | POA: Diagnosis not present

## 2024-02-28 DIAGNOSIS — R87610 Atypical squamous cells of undetermined significance on cytologic smear of cervix (ASC-US): Secondary | ICD-10-CM | POA: Diagnosis not present

## 2024-02-28 DIAGNOSIS — N882 Stricture and stenosis of cervix uteri: Secondary | ICD-10-CM | POA: Diagnosis not present

## 2024-02-28 DIAGNOSIS — R87810 Cervical high risk human papillomavirus (HPV) DNA test positive: Secondary | ICD-10-CM | POA: Diagnosis not present

## 2024-02-28 DIAGNOSIS — N898 Other specified noninflammatory disorders of vagina: Secondary | ICD-10-CM

## 2024-02-28 MED ORDER — MISOPROSTOL 200 MCG PO TABS
ORAL_TABLET | ORAL | 0 refills | Status: DC
  Filled 2024-02-28: qty 2, 1d supply, fill #0

## 2024-02-28 NOTE — Progress Notes (Unsigned)
 57 y.o. 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 Pap obtained 02/09/2024.    Prior evaluation/treatment:  no recent treatment. . No LMP recorded (lmp unknown). Patient is postmenopausal.          Sexually active: No.  The current method of family planning is post menopausal status.     Patient has been counseled about results and procedure.  Risks and benefits have bene reviewed including immediate and/or delayed bleeding, infection, cervical scaring from procedure, possibility of needing additional follow up as well as treatment.  Rare risks of missing a lesion discussed as well.  All questions answered.  Pt ready to proceed.  Consent obtained.  BP 134/77 (BP Location: Right Arm, Patient Position: Sitting, Cuff Size: Normal)   Pulse 85   Wt 248 lb 9.6 oz (112.8 kg)   LMP  (LMP Unknown)   SpO2 99%   BMI 41.37 kg/m   General appearance: alert, cooperative and appears stated age Lymph nodes: No abnormal inguinal nodes palpated Neurologic: Grossly normal  Pelvic: External genitalia:  no lesions              Urethra:  normal appearing urethra with no masses, tenderness or lesions              Bartholins and Skenes: normal                 Vagina: normal appearing vagina with normal color and no discharge, no lesions               Physical Exam Constitutional:      Appearance: Normal appearance.  Neurological:     General: No focal deficit present.     Mental Status: She is alert.  Psychiatric:        Mood and Affect: Mood normal.    Speculum placed.  3% acetic acid applied to cervix for >45 seconds.  Cervix visualized with both 7.5X and 15X magnification.  Green filter also used.  Lugols solution {WAS/WAS NOT:[phone]::was not} used.  Findings:  ***.  Biopsy:  ***.  ECC:  {WAS/WAS NOT:[phone]::was not} performed.  Monsel's {WAS/WAS NOT:[phone]::was not} needed.  Excellent  hemostasis was present.  Pt tolerated procedure well and all instruments were removed.  ***Findings noted above on picture of cervix.  Chaperone, ***, was present during procedure.  Assessment/Plan:  - Pathology results will be called to patient and follow-up planned pending results.

## 2024-03-01 LAB — SURGICAL PATHOLOGY

## 2024-03-03 ENCOUNTER — Encounter (HOSPITAL_BASED_OUTPATIENT_CLINIC_OR_DEPARTMENT_OTHER)

## 2024-03-04 ENCOUNTER — Other Ambulatory Visit (HOSPITAL_BASED_OUTPATIENT_CLINIC_OR_DEPARTMENT_OTHER): Payer: Self-pay

## 2024-03-07 ENCOUNTER — Ambulatory Visit (HOSPITAL_BASED_OUTPATIENT_CLINIC_OR_DEPARTMENT_OTHER): Admitting: Physical Therapy

## 2024-03-07 DIAGNOSIS — M6281 Muscle weakness (generalized): Secondary | ICD-10-CM

## 2024-03-07 DIAGNOSIS — M25661 Stiffness of right knee, not elsewhere classified: Secondary | ICD-10-CM | POA: Diagnosis not present

## 2024-03-07 DIAGNOSIS — R262 Difficulty in walking, not elsewhere classified: Secondary | ICD-10-CM | POA: Diagnosis not present

## 2024-03-07 DIAGNOSIS — M79651 Pain in right thigh: Secondary | ICD-10-CM

## 2024-03-07 NOTE — Therapy (Signed)
 OUTPATIENT PHYSICAL THERAPY LOWER EXTREMITY TREATMENT    Patient Name: Amanda Mack MRN: [id number] DOB:01/27/1966, 58 y.o., female Today's Date: 03/08/2024  END OF SESSION:  PT End of Session - 03/07/24 1114     Visit Number 13    Number of Visits 21    Date for PT Re-Evaluation 03/28/24    Authorization Type [employer]    PT Start Time 1024    PT Stop Time 1120    PT Time Calculation (min) 56 min    Activity Tolerance Patient tolerated treatment well    Behavior During Therapy WFL for tasks assessed/performed                      Past Medical History:  Diagnosis Date   Adopted    Allergy    Arthritis    GERD (gastroesophageal reflux disease)    H/O LEEP 1990   Hypertension    Migraines    Routine general medical examination at a health care facility 03/09/2023   Past Surgical History:  Procedure Laterality Date   KNEE ARTHROSCOPY Left 2019   SHOULDER SURGERY     Patient Active Problem List   Diagnosis Date Noted   Routine general medical examination at a health care facility 03/09/2023   Menopausal symptoms 08/13/2022   Hyperlipidemia 09/23/2020   Vitamin D  deficiency, unspecified 03/01/2019   Sinus tachycardia 09/29/2016   Essential hypertension 04/17/2016   Class 3 severe obesity with serious comorbidity and body mass index (BMI) of 40.0 to 44.9 in adult 04/17/2016     REFERRING PROVIDER: Bissell, Jaclyn, PA-C  REFERRING DIAG: Pain in R lower limb, M79.604  THERAPY DIAG:  Pain in right thigh  Muscle weakness (generalized)  Difficulty in walking, not elsewhere classified  Stiffness of right knee, not elsewhere classified  Rationale for Evaluation and Treatment: Rehabilitation  ONSET DATE: 12/17/2023 and 01/02/2024  SUBJECTIVE:   SUBJECTIVE STATEMENT:  Pt denies any adverse effects after prior Rx.  Pt reports improved performance of stairs.  Pt reports she gets fatigued with ambulation.  Pt ambulates in home and in the office  without a crutch.  She uses one crutch outside of home and outside of office.  Pt denies pain currently.  She feels more in her knee than her HS now.  Pt is limited with straightening knee.     PERTINENT HISTORY: Bilat knee OA, L knee arthroscopy 2013 Hx of SI pain R Shoulder surgery HTN  PAIN:  NPRS:  0/10  now, 1/10 worst Location:  distal R HS   Type:  Achy   PRECAUTIONS: Other: knee OA    WEIGHT BEARING RESTRICTIONS: No  FALLS:  Has patient fallen in last 6 months? 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   PLOF: Independent  PATIENT GOALS: to remove crutches and walk normally   OBJECTIVE:  Note: Objective measures were completed at Evaluation unless otherwise noted.  DIAGNOSTIC FINDINGS: X rays per PA note:  tricompartmental degenerative changes present without acute displaced fracture or dislocation identified.  When compared to radiographs from Nov 2024, no significant changes identified.  PATIENT SURVEYS:  LEFS  Extreme difficulty/unable (0), Quite a bit of difficulty (1), Moderate difficulty (2), Little difficulty (3), No difficulty (4) Survey date:  01/06/24 02/22/24  Any of your usual work, housework or school activities 0 3  2. Usual hobbies, recreational or sporting activities 0 0  3. Getting into/out of the bath 3 4  4. Walking between rooms 3 3.5  5. Putting on socks/shoes 2 4  6. Squatting  0 0  7. Lifting an object, like a bag of groceries from the floor 0 3  8. Performing light activities around your home 0 3.5  9. Performing heavy activities around your home 0 1.5  10. Getting into/out of a car 2 4  11. Walking 2 blocks 0 1  12. Walking 1 mile 0 0  13. Going up/down 10 stairs (1 flight) 0 3.5  14. Standing for 1 hour 0 2  15.  sitting for 1 hour 0 4  16. Running on even ground 0 0  17.  Running on uneven ground 0 0  18. Making sharp turns while running fast 0 0  19. Hopping  0 0  20. Rolling over in bed 4 4  Score total:  14/80 40/80     COGNITION: Overall cognitive status: Within functional limits for tasks assessed      PALPATION: TTP medial and lateral distal HS.   LOWER EXTREMITY ROM:   ROM Right eval Left Eval AROM Right 9/2  Hip flexion     Hip extension     Hip abduction     Hip adduction     Hip internal rotation     Hip external rotation     Knee flexion 109 PROM 129 121  Knee extension 0 deg in resting 7 3  Ankle dorsiflexion     Ankle plantarflexion     Ankle inversion     Ankle eversion      (Blank rows = not tested)  LOWER EXTREMITY MMT:  MMT Right eval Left eval Right 9/2  Hip flexion     Hip extension     Hip abduction   5/5  Hip adduction     Hip internal rotation     Hip external rotation     Knee flexion   Pt able to perform prone knee flexion AROM without pain.  Pt tolerated minimal resistance well without pain.  Knee extension     Ankle dorsiflexion     Ankle plantarflexion     Ankle inversion     Ankle eversion      (Blank rows = not tested)   GAIT: Comments: Pt ambulated with and without crutch.  Pt leans toward her crutch when ambulating with crutch.  Pt applies good weight through R LE and is not limping.  Pt lacks TKE with gait.  Pt ambulated well without crutch.                                                                                                                                  TREATMENT:     03/07/24 Scifit bike L3x5 minutes Pt ambulated 300 ft without crutch  bridges x10 Staggered bridges  2x10  Prone HS curls R 7.5# 2x12 Sidestepping x 10 reps and with RTB around LE's above knees 2x10 Braiding 3x10 Tandem stance 2x30 sec R LE back SLS with UE support 2x10 sec  CP to post knee and distal HS in supine x 10 mins (unbilled time)  02/25/24  Scifit bike L5x5 minutes Gait training without  crutch 2X 240 feet  Shuttle BLE press 87# 2x15ea Shuttle LE press single leg 50# 2x15ea Walking bridges 2x10 Staggered bridges 2x10 B Prone HS curls R 7.5# 2x12 Thomas stretch 2x 20sec Hip flexor stretch at stair 30 seconds x 2 each Hamstring stretch at stair 30 seconds x 2 each  02/22/24: Scift bike x 5 mins Bridges x 10 Walking bridges x 10 Prone eccentric HS with manual resistance 2x5 Sidestepping x 3 laps at rail Braiding x 3 laps at rail  PT assessed gait, palpation, ROM, and strength.  See above. Pt completed LEFS.  See above   02/18/24  Scifit bike L4x5 minutes Gait training without crutch long loop x2 heel toe pattern- some tightness in distal HS but no pain, less than 1/10 discomfort    Shuttle BLE press 87# x12 Shuttle LE press single leg 43# x12 B Walking bridges x10 Staggered bridges x10 B Prone HS curls R 6.5# 2x12 Prone hip extensions 4# x12 R Forward step ups 6 inch box x15 B Lateral step ups 6 inch box x15 B Hip hikes x10 B Hip hikes + swing x10 B    02/15/24  Scifit bike L3x5 minutes  Roller to R HS Prone HSC x10 Prone eccentric HS with manual resistance 2x5 Prone HS isometrics multiangle  Prone hip extension with knee flexed x10 Prone hip extension with HSC 2x10 Sidelying hip ABD red TB x12 B Forward step ups 6 inch box x12 B Lateral step ups 6 inch box x12 B Squats at bar x12 self selected depth Shuttle BLE press 75# 2x20   02/11/24  Scifit bike L4x6 minutes for w/u   Shuttle BLE press 75# x12 Shuttle single leg press 37# R LE 2x12, L LE 2x12 for active recovery between RLE sets  Forward step ups 4 inch box x12 B Lateral step ups 4 inch box x12 B Squats at bar x12 self selected depth Bridges + ABD red TB x15 Sidelying hip ABD red TB x12 B Attempted single leg bridges unable R (no pain just weakness) Walking bridge x5  HS stretches 2x30 seconds B       8/19: Nu-step L4 x81min  Prone HSC 2# x10ea Prone hip extension  2x10ea HS isometric hooklying 5 2x10ea S/l hip aduction 2x10ea Prone TKE 5 x15bil Hip flexor stretch at stairs 30sec x2ea Gait with cues for hip extension and TKE in stance phase Sit to stands on plinth 2x10 HS stretch with strap 3x20sec bil LAQ 4# 3 2x10   8/14: Recumbent bike L2 x31min Prone HSC 2# x10 Seated HSC YTB 2x10ea Prone hip extension 2x10ea S/l hip aduction 2x10ea Prone TKE 5 x15bil Hip flexor stretch at stairs 30sec x2ea Gait with cues for hip extension and TKE in stance phase Hip hinge 2x10 at wall Sit to stands on plinth x10 ITB stretch with strap 3x20sec R    PATIENT EDUCATION:  Education details: relevant anatomy, rationale of interventions, POC, dx, objective findings, progress, prognosis, gait, crutch usage, HEP, and ice usage.  PT answered questions. Person educated: Patient Education method: Explanation, Demonstration, Verbal cues, and Handouts Education comprehension: verbalized understanding, returned demonstration, verbal cues  required, and needs further education  HOME EXERCISE PROGRAM: Seated mid range submaximal HS isometric once per day, 2 sets of 5-10 reps with 5 sec hold  ASSESSMENT:  CLINICAL IMPRESSION:  Pt is 9 weeks and 2 days s/p injury.  Pt is ambulating in home and in the office without a crutch though uses the crutch otherwise.  Pt reports fatigue with ambulation though improved performance of stairs.  Pt ambulated 300 ft without crutch well with good stability at the end of treatment.  Pt performed exercises well with cuing for correct form.  Pt denies pain though reports tightness with in knee including post and medial knee with SLS.  She responded well to Rx stating she feels tired, but has no pain after Rx.  Pt requested ice and PT applied ice to post knee/distal HS at the end of Rx.    OBJECTIVE IMPAIRMENTS: Abnormal gait, decreased activity tolerance, decreased endurance, decreased mobility, difficulty walking, decreased ROM,  decreased strength, hypomobility, increased fascial restrictions, impaired flexibility, and pain.   ACTIVITY LIMITATIONS: carrying, bending, standing, squatting, stairs, transfers, and locomotion level, dressing, bathing  PARTICIPATION LIMITATIONS: meal prep, cleaning, laundry, driving, shopping, and community activity  PERSONAL FACTORS: 1-2 comorbidities: bilat knee OA, Hx of SI pain are also affecting patient's functional outcome.   REHAB POTENTIAL: Good  CLINICAL DECISION MAKING: Stable/uncomplicated  EVALUATION COMPLEXITY: Low   GOALS  SHORT TERM GOALS:   Pt will be independent and compliant with HEP for improved pain, strength, ROM, and function.  Baseline: Goal status: MET 8/12 Target date:  01/27/2024  2.  Pt will wean out of KI and off crutches without adverse effects.   Baseline:  Goal status: PARTIALLY MET  9/2 Target date:  01/27/2024  3.  Pt will report she is able to ambulate community distance without any feelings of instability.  Baseline:  Goal status:  PARTIALLY MET  9/2; no feelings of instability, but using crutch Target date: 02/03/2024  4.  Pt will ambulate with a normalized heel to toe gait pattern without limping.  Baseline:  Goal status:  PROGRESSING  9/2 Target date:  02/10/2024  5.  Pt will be able to perform her self care activities and shower transfers without increased pain and difficulty.  Baseline:  Goal status: GOAL MET   9/2 Target date:  02/17/2024  6.  Pt will be able to tolerate exercises without adverse effects for improved strength and proprioception to assist with returning to PLOF.  Baseline:  Goal status: GOAL MET  9/2 Target date:  02/10/2024  LONG TERM GOALS: Target date: 03/28/2024  Pt will be able to perform stairs with a reciprocal gait with good control without difficulty.  Baseline:  Goal status: PROGRESSING  2.  Pt will be able to perform her household chores without significant pain and difficulty.  Baseline:  Goal  status: GOAL MET  9/2  3.  Pt will ambulate extended community distance without significant pain and difficulty.  Baseline:  Goal status: ONGOING  4.  Pt will demo at least a 4 to 4+/5 MMT strength in R HS for improved performance of functional mobility and tolerance of daily mobility.  Baseline:  Goal status: PROGRESSING     PLAN:  PT FREQUENCY: 2x/wk   PT DURATION: 5 weeks  PLANNED INTERVENTIONS: 97164- PT Re-evaluation, 97750- Physical Performance Testing, 97110-Therapeutic exercises, 97530- Therapeutic activity, [id number]- Neuromuscular re-education, 97535- Self Care, 02859- Manual therapy, [id number]- Gait training, [phone]- Aquatic Therapy, [phone]- Electrical stimulation (unattended), [phone]- Electrical stimulation (  manual), 02964- Ultrasound, Patient/Family education, Balance training, Stair training, Taping, Joint mobilization, Spinal mobilization, DME instructions, Cryotherapy, and Moist heat  PLAN FOR NEXT SESSION: Cont per protocol, healing constrains, and sx's.  Gait training, work towards getting away from crutch as able.  Ice as needed.   Leigh Minerva III PT, DPT 03/08/24 10:39 PM

## 2024-03-08 ENCOUNTER — Encounter (HOSPITAL_BASED_OUTPATIENT_CLINIC_OR_DEPARTMENT_OTHER): Payer: Self-pay | Admitting: Obstetrics & Gynecology

## 2024-03-08 ENCOUNTER — Encounter (HOSPITAL_BASED_OUTPATIENT_CLINIC_OR_DEPARTMENT_OTHER): Payer: Self-pay | Admitting: Physical Therapy

## 2024-03-08 ENCOUNTER — Ambulatory Visit (HOSPITAL_BASED_OUTPATIENT_CLINIC_OR_DEPARTMENT_OTHER): Admitting: Obstetrics & Gynecology

## 2024-03-08 VITALS — BP 136/96 | HR 77 | Wt 249.2 lb

## 2024-03-08 DIAGNOSIS — N893 Dysplasia of vagina, unspecified: Secondary | ICD-10-CM

## 2024-03-08 NOTE — Progress Notes (Unsigned)
 GYNECOLOGY  VISIT  CC:   ECC, discuss pathology results  HPI: 58 y.o. G0P0000 Widowed White or Caucasian female here for repeat attempt at endocervical curettage.  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  At the time of her colposcopy, no cervical lesion was noted.  She does have a history of prior LEEP/conization and there is cervical stenosis.  Her colposcopy did not show any findings on her cervix however there was a vaginal lesion that was to the patient's left and slightly inferiorly from the cervix.  This biopsy showed VAIN 3.  Pathology findings discussed.  I have reviewed with Dr. Viktoria and she will either need excision or ablative treatment.  Patient is aware I do not do this very much and referral to GYN oncology will be placed.  I did not want to place this referral without discussing with patient first.  She has used Cytotec  vaginally prior to the procedure today.   Past Medical History:  Diagnosis Date   Adopted    Allergy    Arthritis    GERD (gastroesophageal reflux disease)    H/O LEEP 1990   Hypertension    Migraines    Routine general medical examination at a health care facility 03/09/2023    MEDS:   Current Outpatient Medications on File Prior to Visit  Medication Sig Dispense Refill   estradiol  (ESTRACE ) 0.5 MG tablet Take 1 tablet (0.5 mg total) by mouth daily. 90 tablet 3   losartan  (COZAAR ) 25 MG tablet Take 1 tablet (25 mg total) by mouth daily. (Patient not taking: Reported on 02/28/2024) 90 tablet 1   meloxicam  (MOBIC ) 15 MG tablet Take 1 tablet (15 mg total) by mouth daily with a meal 30 tablet 2   methocarbamol  (ROBAXIN ) 500 MG tablet Take 1 tablet (500 mg total) by mouth 2 (two) times daily. (Patient not taking: Reported on 02/28/2024) 14 tablet 0   metoprolol  succinate (TOPROL -XL) 50 MG 24 hr tablet TAKE 1 TABLET DAILY WITH OR IMMEDIATELY FOLLOWING A MEAL 90 tablet 3   misoprostol  (CYTOTEC ) 200 MCG tablet Place 1 tablet vaginally pm and morning of  procedure 2 tablet 0   omeprazole  (PRILOSEC) 20 MG capsule Take 1 capsule (20 mg total) by mouth as needed. 30 capsule 3   progesterone  (PROMETRIUM ) 100 MG capsule Take 1 capsule by mouth nightly on days 1-14 of each month. 42 capsule 3   SUMAtriptan  (IMITREX ) 100 MG tablet Take 1 tablet (100 mg total) by mouth once as needed for up to 1 dose for migraine. May repeat in 2 hours if headache persists or recurs. 9 tablet 11   No current facility-administered medications on file prior to visit.    ALLERGIES: Nickel and Strawberry extract  SH: Widowed, non-smoker  Review of Systems  Constitutional: Negative.   Genitourinary: Negative.     PHYSICAL EXAMINATION:    BP (!) 136/96 (BP Location: Right Arm, Patient Position: Sitting, Cuff Size: Normal)   Pulse 77   Wt 249 lb 3.2 oz (113 kg)   LMP  (LMP Unknown)   SpO2 99%   BMI 41.47 kg/m     General appearance: alert, cooperative and appears stated age  Lymph:  no inguinal LAD noted  Pelvic: External genitalia:  no lesions              Urethra:  normal appearing urethra with no masses, tenderness or lesions              Bartholins and Skenes:  normal                 Vagina: Normal mucosa, however with placement of the speculum there was some mild bleeding from the prior biopsy site.  This became hemostatic without any intervention.              Cervix: no lesions              Procedure: Cervix cleansed with Betadine x 3.  Single-tooth tenaculum applied to the lip of the cervix.  Attempted dilation of the cervix was performed with both metal dilators and also with os finder.  This was unsuccessful after multiple attempts.  Patient was uncomfortable with the procedure and also moderate denuding of the external cervical tissue due to the multiple attempts at biopsy.  Procedure was stopped.  Chaperone was present for exam.  Assessment/Plan: 1. Vaginal dysplasia (Primary) -Colposcopy did not show any lesions on the cervix when this was done  about a week and a half ago.  ECC has not been completed due to to cervical stenosis - Ambulatory referral to Gynecologic Oncology  2.  Cervical stenosis

## 2024-03-09 ENCOUNTER — Ambulatory Visit (HOSPITAL_BASED_OUTPATIENT_CLINIC_OR_DEPARTMENT_OTHER): Payer: Self-pay | Admitting: Obstetrics & Gynecology

## 2024-03-09 ENCOUNTER — Telehealth: Payer: Self-pay | Admitting: *Deleted

## 2024-03-09 NOTE — Telephone Encounter (Signed)
 Spoke with the patient regarding the referral to GYN oncology. Patient scheduled as new patient with Dr Viktoria on 10/2 at 10:30 am. Patient given an arrival time of 10 am.  Explained to the patient the the doctor will perform a pelvic exam at this visit. Patient given the policy that only one visitor allowed and that visitor must be over 16 yrs are allowed in the Cancer Center. Patient given the address/phone number for the clinic and that the center offers free valet service. Patient aware that masks optional.

## 2024-03-09 NOTE — Telephone Encounter (Signed)
 LMOM for the patient to call the office back. Patient needs to be scheduled for a new patient appt with Dr Viktoria on 10/2

## 2024-03-14 ENCOUNTER — Ambulatory Visit (HOSPITAL_BASED_OUTPATIENT_CLINIC_OR_DEPARTMENT_OTHER): Payer: Self-pay

## 2024-03-14 ENCOUNTER — Encounter (HOSPITAL_BASED_OUTPATIENT_CLINIC_OR_DEPARTMENT_OTHER): Payer: Self-pay

## 2024-03-14 DIAGNOSIS — M6281 Muscle weakness (generalized): Secondary | ICD-10-CM | POA: Diagnosis not present

## 2024-03-14 DIAGNOSIS — M25661 Stiffness of right knee, not elsewhere classified: Secondary | ICD-10-CM | POA: Diagnosis not present

## 2024-03-14 DIAGNOSIS — R262 Difficulty in walking, not elsewhere classified: Secondary | ICD-10-CM

## 2024-03-14 DIAGNOSIS — M79651 Pain in right thigh: Secondary | ICD-10-CM

## 2024-03-14 NOTE — Therapy (Signed)
 OUTPATIENT PHYSICAL THERAPY LOWER EXTREMITY TREATMENT    Patient Name: Amanda Mack MRN: [id number] DOB:[birth year], 58 y.o., female Today's Date: 03/14/2024  END OF SESSION:  PT End of Session - 03/14/24 1102     Visit Number 14    Number of Visits 21    Date for Recertification  03/28/24    Authorization Type BCBS    PT Start Time 1100    PT Stop Time 1145    PT Time Calculation (min) 45 min    Activity Tolerance Patient tolerated treatment well    Behavior During Therapy WFL for tasks assessed/performed                       Past Medical History:  Diagnosis Date   Adopted    Allergy    Arthritis    GERD (gastroesophageal reflux disease)    H/O LEEP 1990   Hypertension    Migraines    Routine general medical examination at a health care facility 03/09/2023   Past Surgical History:  Procedure Laterality Date   KNEE ARTHROSCOPY Left 2019   SHOULDER SURGERY     Patient Active Problem List   Diagnosis Date Noted   Routine general medical examination at a health care facility 03/09/2023   Menopausal symptoms 08/13/2022   Hyperlipidemia 09/23/2020   Vitamin D  deficiency, unspecified 03/01/2019   Sinus tachycardia 09/29/2016   Essential hypertension 04/17/2016   Class 3 severe obesity with serious comorbidity and body mass index (BMI) of 40.0 to 44.9 in adult 04/17/2016     REFERRING PROVIDER: Bissell, Jaclyn, PA-C  REFERRING DIAG: Pain in R lower limb, M79.604  THERAPY DIAG:  Pain in right thigh  Muscle weakness (generalized)  Difficulty in walking, not elsewhere classified  Stiffness of right knee, not elsewhere classified  Rationale for Evaluation and Treatment: Rehabilitation  ONSET DATE: 12/17/2023 and 01/02/2024  SUBJECTIVE:   SUBJECTIVE STATEMENT:  Pt reports knees have been more swollen and tight, especially in the mornings. Denies significant pain. Has bene working on stretches at home. Drove to [facility] this weekend.      PERTINENT HISTORY: Bilat knee OA, L knee arthroscopy 2013 Hx of SI pain R Shoulder surgery HTN  PAIN:  NPRS:  0/10  now, 1/10 worst Location:  distal R HS   Type:  Achy   PRECAUTIONS: Other: knee OA    WEIGHT BEARING RESTRICTIONS: No  FALLS:  Has patient fallen in last 6 months? 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   PLOF: Independent  PATIENT GOALS: to remove crutches and walk normally   OBJECTIVE:  Note: Objective measures were completed at Evaluation unless otherwise noted.  DIAGNOSTIC FINDINGS: X rays per PA note:  tricompartmental degenerative changes present without acute displaced fracture or dislocation identified.  When compared to radiographs from Nov 2024, no significant changes identified.  PATIENT SURVEYS:  LEFS  Extreme difficulty/unable (0), Quite a bit of difficulty (1), Moderate difficulty (2), Little difficulty (3), No difficulty (4) Survey date:  01/06/24 02/22/24  Any of your usual work, housework or school activities 0 3  2. Usual hobbies, recreational or sporting activities 0 0  3. Getting into/out of the bath 3 4  4. Walking between rooms 3 3.5  5. Putting on socks/shoes 2 4  6. Squatting  0 0  7. Lifting  an object, like a bag of groceries from the floor 0 3  8. Performing light activities around your home 0 3.5  9. Performing heavy activities around your home 0 1.5  10. Getting into/out of a car 2 4  11. Walking 2 blocks 0 1  12. Walking 1 mile 0 0  13. Going up/down 10 stairs (1 flight) 0 3.5  14. Standing for 1 hour 0 2  15.  sitting for 1 hour 0 4  16. Running on even ground 0 0  17. Running on uneven ground 0 0  18. Making sharp turns while running fast 0 0  19. Hopping  0 0  20. Rolling over in bed 4 4  Score total:  14/80 40/80     COGNITION: Overall cognitive  status: Within functional limits for tasks assessed      PALPATION: TTP medial and lateral distal HS.   LOWER EXTREMITY ROM:   ROM Right eval Left Eval AROM Right 9/2  Hip flexion     Hip extension     Hip abduction     Hip adduction     Hip internal rotation     Hip external rotation     Knee flexion 109 PROM 129 121  Knee extension 0 deg in resting 7 3  Ankle dorsiflexion     Ankle plantarflexion     Ankle inversion     Ankle eversion      (Blank rows = not tested)  LOWER EXTREMITY MMT:  MMT Right eval Left eval Right 9/2  Hip flexion     Hip extension     Hip abduction   5/5  Hip adduction     Hip internal rotation     Hip external rotation     Knee flexion   Pt able to perform prone knee flexion AROM without pain.  Pt tolerated minimal resistance well without pain.  Knee extension     Ankle dorsiflexion     Ankle plantarflexion     Ankle inversion     Ankle eversion      (Blank rows = not tested)   GAIT: Comments: Pt ambulated with and without crutch.  Pt leans toward her crutch when ambulating with crutch.  Pt applies good weight through R LE and is not limping.  Pt lacks TKE with gait.  Pt ambulated well without crutch.                                                                                                                                  TREATMENT:     03/14/24: Sci-fit bike L5 x60min  Gait without crutch Standing hip swings fwd/back x15ea Hip hinges x7 Standing HSC unresisted Thomas stretch 3x20sec bil Prone HSC 5# 2x15bil LAQ 5# 3 hold x20ea Cable walks 20-25lbs fwd and back x5ea Sit to stands 2x10    03/07/24 Scifit bike L3x5 minutes Pt ambulated 300 ft without  crutch  bridges x10 Staggered bridges 2x10  Prone HS curls R 7.5# 2x12 Sidestepping x 10 reps and with RTB around LE's above knees 2x10 Braiding 3x10 Tandem stance 2x30 sec R LE back SLS with UE support 2x10 sec  CP to post knee and distal HS in supine x 10 mins  (unbilled time)  02/25/24  Scifit bike L5x5 minutes Gait training without crutch 2X 240 feet  Shuttle BLE press 87# 2x15ea Shuttle LE press single leg 50# 2x15ea Walking bridges 2x10 Staggered bridges 2x10 B Prone HS curls R 7.5# 2x12 Thomas stretch 2x 20sec Hip flexor stretch at stair 30 seconds x 2 each Hamstring stretch at stair 30 seconds x 2 each  02/22/24: Scift bike x 5 mins Bridges x 10 Walking bridges x 10 Prone eccentric HS with manual resistance 2x5 Sidestepping x 3 laps at rail Braiding x 3 laps at rail  PT assessed gait, palpation, ROM, and strength.  See above. Pt completed LEFS.  See above   02/18/24  Scifit bike L4x5 minutes Gait training without crutch long loop x2 heel toe pattern- some tightness in distal HS but no pain, less than 1/10 discomfort    Shuttle BLE press 87# x12 Shuttle LE press single leg 43# x12 B Walking bridges x10 Staggered bridges x10 B Prone HS curls R 6.5# 2x12 Prone hip extensions 4# x12 R Forward step ups 6 inch box x15 B Lateral step ups 6 inch box x15 B Hip hikes x10 B Hip hikes + swing x10 B   PATIENT EDUCATION:  Education details: relevant anatomy, rationale of interventions, POC, dx, objective findings, progress, prognosis, gait, crutch usage, HEP, and ice usage.  PT answered questions. Person educated: Patient Education method: Explanation, Demonstration, Verbal cues, and Handouts Education comprehension: verbalized understanding, returned demonstration, verbal cues required, and needs further education  HOME EXERCISE PROGRAM: Seated mid range submaximal HS isometric once per day, 2 sets of 5-10 reps with 5 sec hold  ASSESSMENT:  CLINICAL IMPRESSION:  Pt reports mild discomfort in beginning of session with ambulation without crutch. This improved following stretching and exercising. No complaints with exercises aside from fatigue. Initiated cable walks for eccentric focus, which she did well with and reported  benefit from. Will continue to progress as tolerated towards single crutch.    OBJECTIVE IMPAIRMENTS: Abnormal gait, decreased activity tolerance, decreased endurance, decreased mobility, difficulty walking, decreased ROM, decreased strength, hypomobility, increased fascial restrictions, impaired flexibility, and pain.   ACTIVITY LIMITATIONS: carrying, bending, standing, squatting, stairs, transfers, and locomotion level, dressing, bathing  PARTICIPATION LIMITATIONS: meal prep, cleaning, laundry, driving, shopping, and community activity  PERSONAL FACTORS: 1-2 comorbidities: bilat knee OA, Hx of SI pain are also affecting patient's functional outcome.   REHAB POTENTIAL: Good  CLINICAL DECISION MAKING: Stable/uncomplicated  EVALUATION COMPLEXITY: Low   GOALS  SHORT TERM GOALS:   Pt will be independent and compliant with HEP for improved pain, strength, ROM, and function.  Baseline: Goal status: MET 8/12 Target date:  01/27/2024  2.  Pt will wean out of KI and off crutches without adverse effects.   Baseline:  Goal status: PARTIALLY MET  9/2 Target date:  01/27/2024  3.  Pt will report she is able to ambulate community distance without any feelings of instability.  Baseline:  Goal status:  PARTIALLY MET  9/2; no feelings of instability, but using crutch Target date: 02/03/2024  4.  Pt will ambulate with a normalized heel to toe gait pattern without limping.  Baseline:  Goal  status:  PROGRESSING  9/2 Target date:  02/10/2024  5.  Pt will be able to perform her self care activities and shower transfers without increased pain and difficulty.  Baseline:  Goal status: GOAL MET   9/2 Target date:  02/17/2024  6.  Pt will be able to tolerate exercises without adverse effects for improved strength and proprioception to assist with returning to PLOF.  Baseline:  Goal status: GOAL MET  9/2 Target date:  02/10/2024  LONG TERM GOALS: Target date: 03/28/2024  Pt will be able to perform  stairs with a reciprocal gait with good control without difficulty.  Baseline:  Goal status: PROGRESSING  2.  Pt will be able to perform her household chores without significant pain and difficulty.  Baseline:  Goal status: GOAL MET  9/2  3.  Pt will ambulate extended community distance without significant pain and difficulty.  Baseline:  Goal status: ONGOING  4.  Pt will demo at least a 4 to 4+/5 MMT strength in R HS for improved performance of functional mobility and tolerance of daily mobility.  Baseline:  Goal status: PROGRESSING     PLAN:  PT FREQUENCY: 2x/wk   PT DURATION: 5 weeks  PLANNED INTERVENTIONS: 97164- PT Re-evaluation, 97750- Physical Performance Testing, 97110-Therapeutic exercises, 97530- Therapeutic activity, [id number]- Neuromuscular re-education, 97535- Self Care, 02859- Manual therapy, [phone]- Gait training, [phone]- Aquatic Therapy, [phone]- Electrical stimulation (unattended), [phone]- Electrical stimulation (manual), [id number]- Ultrasound, Patient/Family education, Balance training, Stair training, Taping, Joint mobilization, Spinal mobilization, DME instructions, Cryotherapy, and Moist heat  PLAN FOR NEXT SESSION: Cont per protocol, healing constrains, and sx's.  Gait training, work towards getting away from crutch as able.  Ice as needed.   Asberry Rodes, PTA  03/14/24 1:09 PM

## 2024-03-16 ENCOUNTER — Encounter: Payer: Self-pay | Admitting: Gynecologic Oncology

## 2024-03-21 ENCOUNTER — Encounter (HOSPITAL_BASED_OUTPATIENT_CLINIC_OR_DEPARTMENT_OTHER): Payer: Self-pay

## 2024-03-21 ENCOUNTER — Ambulatory Visit (HOSPITAL_BASED_OUTPATIENT_CLINIC_OR_DEPARTMENT_OTHER): Payer: Self-pay

## 2024-03-21 DIAGNOSIS — M6281 Muscle weakness (generalized): Secondary | ICD-10-CM

## 2024-03-21 DIAGNOSIS — R262 Difficulty in walking, not elsewhere classified: Secondary | ICD-10-CM

## 2024-03-21 DIAGNOSIS — M25661 Stiffness of right knee, not elsewhere classified: Secondary | ICD-10-CM | POA: Diagnosis not present

## 2024-03-21 DIAGNOSIS — M79651 Pain in right thigh: Secondary | ICD-10-CM | POA: Diagnosis not present

## 2024-03-21 NOTE — Therapy (Signed)
 OUTPATIENT PHYSICAL THERAPY LOWER EXTREMITY TREATMENT    Patient Name: Taziah Difatta MRN: [id number] DOB:[birth year], 58 y.o., female Today's Date: 03/21/2024  END OF SESSION:  PT End of Session - 03/21/24 1439     Visit Number 15    Number of Visits 21    Date for Recertification  03/28/24    Authorization Type BCBS    PT Start Time 1434    PT Stop Time 1518    PT Time Calculation (min) 44 min    Activity Tolerance Patient tolerated treatment well    Behavior During Therapy WFL for tasks assessed/performed                        Past Medical History:  Diagnosis Date   Adopted    Allergy    Arthritis    GERD (gastroesophageal reflux disease)    H/O LEEP 1990   Hypertension    Migraines    Routine general medical examination at a health care facility 03/09/2023   Past Surgical History:  Procedure Laterality Date   KNEE ARTHROSCOPY Left 2019   SHOULDER SURGERY     Patient Active Problem List   Diagnosis Date Noted   Routine general medical examination at a health care facility 03/09/2023   Menopausal symptoms 08/13/2022   Hyperlipidemia 09/23/2020   Vitamin D  deficiency, unspecified 03/01/2019   Sinus tachycardia 09/29/2016   Essential hypertension 04/17/2016   Class 3 severe obesity with serious comorbidity and body mass index (BMI) of 40.0 to 44.9 in adult 04/17/2016     REFERRING PROVIDER: Bissell, Jaclyn, PA-C  REFERRING DIAG: Pain in R lower limb, M79.604  THERAPY DIAG:  Muscle weakness (generalized)  Pain in right thigh  Difficulty in walking, not elsewhere classified  Stiffness of right knee, not elsewhere classified  Rationale for Evaluation and Treatment: Rehabilitation  ONSET DATE: 12/17/2023 and 01/02/2024  SUBJECTIVE:   SUBJECTIVE STATEMENT:  Pt reports knees have been more swollen and tight, especially in the mornings. Denies significant pain. Has bene working on stretches at home. Drove to [facility] this weekend.      PERTINENT HISTORY: Bilat knee OA, L knee arthroscopy 2013 Hx of SI pain R Shoulder surgery HTN  PAIN:  NPRS:  0/10  now, 1/10 worst Location:  distal R HS   Type:  Achy   PRECAUTIONS: Other: knee OA    WEIGHT BEARING RESTRICTIONS: No  FALLS:  Has patient fallen in last 6 months? 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   PLOF: Independent  PATIENT GOALS: to remove crutches and walk normally   OBJECTIVE:  Note: Objective measures were completed at Evaluation unless otherwise noted.  DIAGNOSTIC FINDINGS: X rays per PA note:  tricompartmental degenerative changes present without acute displaced fracture or dislocation identified.  When compared to radiographs from Nov 2024, no significant changes identified.  PATIENT SURVEYS:  LEFS  Extreme difficulty/unable (0), Quite a bit of difficulty (1), Moderate difficulty (2), Little difficulty (3), No difficulty (4) Survey date:  01/06/24 02/22/24  Any of your usual work, housework or school activities 0 3  2. Usual hobbies, recreational or sporting activities 0 0  3. Getting into/out of the bath 3 4  4. Walking between rooms 3 3.5  5. Putting on socks/shoes 2 4  6. Squatting  0 0  7.  Lifting an object, like a bag of groceries from the floor 0 3  8. Performing light activities around your home 0 3.5  9. Performing heavy activities around your home 0 1.5  10. Getting into/out of a car 2 4  11. Walking 2 blocks 0 1  12. Walking 1 mile 0 0  13. Going up/down 10 stairs (1 flight) 0 3.5  14. Standing for 1 hour 0 2  15.  sitting for 1 hour 0 4  16. Running on even ground 0 0  17. Running on uneven ground 0 0  18. Making sharp turns while running fast 0 0  19. Hopping  0 0  20. Rolling over in bed 4 4  Score total:  14/80 40/80     COGNITION: Overall cognitive  status: Within functional limits for tasks assessed      PALPATION: TTP medial and lateral distal HS.   LOWER EXTREMITY ROM:   ROM Right eval Left Eval AROM Right 9/2  Hip flexion     Hip extension     Hip abduction     Hip adduction     Hip internal rotation     Hip external rotation     Knee flexion 109 PROM 129 121  Knee extension 0 deg in resting 7 3  Ankle dorsiflexion     Ankle plantarflexion     Ankle inversion     Ankle eversion      (Blank rows = not tested)  LOWER EXTREMITY MMT:  MMT Right eval Left eval Right 9/2  Hip flexion     Hip extension     Hip abduction   5/5  Hip adduction     Hip internal rotation     Hip external rotation     Knee flexion   Pt able to perform prone knee flexion AROM without pain.  Pt tolerated minimal resistance well without pain.  Knee extension     Ankle dorsiflexion     Ankle plantarflexion     Ankle inversion     Ankle eversion      (Blank rows = not tested)   GAIT: Comments: Pt ambulated with and without crutch.  Pt leans toward her crutch when ambulating with crutch.  Pt applies good weight through R LE and is not limping.  Pt lacks TKE with gait.  Pt ambulated well without crutch.                                                                                                                                  TREATMENT:    03/21/24 Sci-fit bike L5 x39min  Gait without crutch Hip hinges x10 Deadlifts 10lb KB 2x10 Thomas stretch 3x30sec bil Prone HSC 5# 2x10bil Cable walks 25lbs fwd and back x5ea Sit to stands 2x10  Side lunges (partial)  (some knee pain so stopped)  03/14/24: Sci-fit bike L5 x41min  Gait without crutch Standing hip  swings fwd/back x15ea Hip hinges x7 Standing HSC unresisted Thomas stretch 3x20sec bil Prone HSC 5# 2x15bil LAQ 5# 3 hold x20ea Cable walks 20-25lbs fwd and back x5ea Sit to stands 2x10   03/07/24 Scifit bike L3x5 minutes Pt ambulated 300 ft without crutch  bridges  x10 Staggered bridges 2x10  Prone HS curls R 7.5# 2x12 Sidestepping x 10 reps and with RTB around LE's above knees 2x10 Braiding 3x10 Tandem stance 2x30 sec R LE back SLS with UE support 2x10 sec  CP to post knee and distal HS in supine x 10 mins (unbilled time)  02/25/24  Scifit bike L5x5 minutes Gait training without crutch 2X 240 feet  Shuttle BLE press 87# 2x15ea Shuttle LE press single leg 50# 2x15ea Walking bridges 2x10 Staggered bridges 2x10 B Prone HS curls R 7.5# 2x12 Thomas stretch 2x 20sec Hip flexor stretch at stair 30 seconds x 2 each Hamstring stretch at stair 30 seconds x 2 each  02/22/24: Scift bike x 5 mins Bridges x 10 Walking bridges x 10 Prone eccentric HS with manual resistance 2x5 Sidestepping x 3 laps at rail Braiding x 3 laps at rail  PT assessed gait, palpation, ROM, and strength.  See above. Pt completed LEFS.  See above   02/18/24  Scifit bike L4x5 minutes Gait training without crutch long loop x2 heel toe pattern- some tightness in distal HS but no pain, less than 1/10 discomfort    Shuttle BLE press 87# x12 Shuttle LE press single leg 43# x12 B Walking bridges x10 Staggered bridges x10 B Prone HS curls R 6.5# 2x12 Prone hip extensions 4# x12 R Forward step ups 6 inch box x15 B Lateral step ups 6 inch box x15 B Hip hikes x10 B Hip hikes + swing x10 B   PATIENT EDUCATION:  Education details: relevant anatomy, rationale of interventions, POC, dx, objective findings, progress, prognosis, gait, crutch usage, HEP, and ice usage.  PT answered questions. Person educated: Patient Education method: Explanation, Demonstration, Verbal cues, and Handouts Education comprehension: verbalized understanding, returned demonstration, verbal cues required, and needs further education  HOME EXERCISE PROGRAM: Seated mid range submaximal HS isometric once per day, 2 sets of 5-10 reps with 5 sec hold  ASSESSMENT:  CLINICAL IMPRESSION:  Pt continues  to be limited by knee pain with exercises. Feels strong stretching with long sit HSS. Instructed pt to continue with this at home as well as hip flexor stretching. Good tolerance for resisted cable walk outs. Trialed deadlifts with verbal cuing provided for proper mechanics. She did report some lumbar discomfort with this towards the end of first set of repetitions.   OBJECTIVE IMPAIRMENTS: Abnormal gait, decreased activity tolerance, decreased endurance, decreased mobility, difficulty walking, decreased ROM, decreased strength, hypomobility, increased fascial restrictions, impaired flexibility, and pain.   ACTIVITY LIMITATIONS: carrying, bending, standing, squatting, stairs, transfers, and locomotion level, dressing, bathing  PARTICIPATION LIMITATIONS: meal prep, cleaning, laundry, driving, shopping, and community activity  PERSONAL FACTORS: 1-2 comorbidities: bilat knee OA, Hx of SI pain are also affecting patient's functional outcome.   REHAB POTENTIAL: Good  CLINICAL DECISION MAKING: Stable/uncomplicated  EVALUATION COMPLEXITY: Low   GOALS  SHORT TERM GOALS:   Pt will be independent and compliant with HEP for improved pain, strength, ROM, and function.  Baseline: Goal status: MET 8/12 Target date:  01/27/2024  2.  Pt will wean out of KI and off crutches without adverse effects.   Baseline:  Goal status: PARTIALLY MET  9/2 Target date:  01/27/2024  3.  Pt will report she is able to ambulate community distance without any feelings of instability.  Baseline:  Goal status:  PARTIALLY MET  9/2; no feelings of instability, but using crutch Target date: 02/03/2024  4.  Pt will ambulate with a normalized heel to toe gait pattern without limping.  Baseline:  Goal status:  PROGRESSING  9/2 Target date:  02/10/2024  5.  Pt will be able to perform her self care activities and shower transfers without increased pain and difficulty.  Baseline:  Goal status: GOAL MET   9/2 Target date:   02/17/2024  6.  Pt will be able to tolerate exercises without adverse effects for improved strength and proprioception to assist with returning to PLOF.  Baseline:  Goal status: GOAL MET  9/2 Target date:  02/10/2024  LONG TERM GOALS: Target date: 03/28/2024  Pt will be able to perform stairs with a reciprocal gait with good control without difficulty.  Baseline:  Goal status: PROGRESSING  2.  Pt will be able to perform her household chores without significant pain and difficulty.  Baseline:  Goal status: GOAL MET  9/2  3.  Pt will ambulate extended community distance without significant pain and difficulty.  Baseline:  Goal status: ONGOING  4.  Pt will demo at least a 4 to 4+/5 MMT strength in R HS for improved performance of functional mobility and tolerance of daily mobility.  Baseline:  Goal status: PROGRESSING     PLAN:  PT FREQUENCY: 2x/wk   PT DURATION: 5 weeks  PLANNED INTERVENTIONS: 97164- PT Re-evaluation, 97750- Physical Performance Testing, 97110-Therapeutic exercises, 97530- Therapeutic activity, [id number]- Neuromuscular re-education, 97535- Self Care, 02859- Manual therapy, [phone]- Gait training, [phone]- Aquatic Therapy, [phone]- Electrical stimulation (unattended), [phone]- Electrical stimulation (manual), [id number]- Ultrasound, Patient/Family education, Balance training, Stair training, Taping, Joint mobilization, Spinal mobilization, DME instructions, Cryotherapy, and Moist heat  PLAN FOR NEXT SESSION: Cont per protocol, healing constrains, and sx's.  Gait training, work towards getting away from crutch as able.  Ice as needed.   Asberry Rodes, PTA  03/21/24 4:57 PM

## 2024-03-22 NOTE — Progress Notes (Unsigned)
 GYNECOLOGIC ONCOLOGY NEW PATIENT CONSULTATION   Patient Name: Amanda Mack  Patient Age: 58 y.o. Date of Service: 03/23/24 Referring Provider: Ronal Pinal, MD  Primary Care Provider: [place], Betty G, MD Consulting Provider: Comer Dollar, MD   Assessment/Plan:  Postmenopausal patient with HPV-related vaginal dysplasia.   Reviewed in detail her recent history including Pap test, HPV results, and vaginal biopsy.  She has a remote history of what sounds like cervical dysplasia (although her recollection is that this was HPV unrelated).   Spent some time discussing pathogenesis of cervical, vaginal, and vulvar dysplasia, especially in the setting of HPV infection.  Reviewed that this can be an infection that someone has for years or decades before it causes HPV related disease.  Discussed recommendation to treat high-grade vaginal dysplasia given increased risk of progression to cancer.  We reviewed options from a treatment standpoint including surgical (excision versus CO2 laser) and topical treatments.  Given findings on her exam and location, I recommend that we proceed with surgical excision although will have laser available based on additional findings at the time of her exam under anesthesia.   We discussed the need for close surveillance even with adequate treatment now given the risk of recurrence of HPV- related dysplasia.   We discussed tentative plan to proceed with partial vaginectomy, possible vaginal biopsies and CO2 laser ablation, and endocervical curettage. We reviewed the risks of surgery were discussed in detail and she understands these to include infection; injury to adjacent organs such as bowel, bladder; bleeding which may require blood transfusion; anesthesia risk; thromboembolic events; possible death; unforeseen complications; possible need for re-exploration; medical complications such as heart attack, stroke, pleural effusion and pneumonia. The patient will  receive DVT and antibiotic prophylaxis as indicated. She voiced a clear understanding. She had the opportunity to ask questions. Perioperative instructions were reviewed with her.   A copy of this note was sent to the patient's referring provider.   60 minutes of total time was spent for this patient encounter, including preparation, face-to-face counseling with the patient and coordination of care, and documentation of the encounter.  Comer Dollar, MD  Division of Gynecologic Oncology  Department of Obstetrics and Gynecology  [facility]  [facility]  ___________________________________________  Chief Complaint: No chief complaint on file.   History of Present Illness:  Amanda Mack is a 58 y.o. y.o. female who is seen in consultation at the request of Dr. Pinal for an evaluation of vaginal dysplasia.  Patient endorses a history of abnormal Pap smear in the age of 7, at which time precancerous versus cancerous cells were seen.  There was some confusion whether this was from the cervix or the uterus.  She remembers being HPV negative at that time.  She underwent colposcopy and ultimately had cervical excision procedure and D&C.  She remembers that there were cancerous cells, does not remember if there was any cancer found.  Denies needing any additional treatment such as radiation or chemotherapy, additional surgeries.  Her follow-up testing was all normal.  She had 1 mildly abnormal Pap smear in the intervening time it was felt to be a lab error as the repeat test came back normal.  Recent cytology history as noted below: 09/2020: Pap NILM, HR HPV negative 01/2024: Pap ASCUS, HPV 16+ 02/28/24: Colposcopy performed with no cervical findings, decreased uptake of Lugols along vaginal wall lateral to the cervix (multifocal, left posterior vagina). ECC attempted but not successful to do cervical stenosis. Vaginal biopsy showed  VaIN 2-3  Today, the patient presents alone.   She notes doing well.  Denies any pelvic or abdominal pain.  Denies any vaginal discharge or bleeding.  Reports baseline bowel function.  Denies any urinary symptoms.  Denies any recent weight changes.  Notes having some autoimmune issues after the death of her husband.  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  PAST MEDICAL HISTORY:  Past Medical History:  Diagnosis Date   Adopted    Allergy    Arthritis    GERD (gastroesophageal reflux disease)    H/O LEEP 1990   Hypertension    Migraines    Routine general medical examination at a health care facility 03/09/2023     PAST SURGICAL HISTORY:  Past Surgical History:  Procedure Laterality Date   CERVICAL CONE BIOPSY     AND d&c   KNEE ARTHROSCOPY Left 2019   SHOULDER SURGERY      OB/GYN HISTORY:  OB History  Gravida Para Term Preterm AB Living  0 0 0 0 0 0  SAB IAB Ectopic Multiple Live Births  0 0 0 0 0    No LMP recorded (lmp unknown). Patient is postmenopausal.  Age at menarche: 32  Age at menopause: 66 Hx of HRT: yes Hx of STDs: HPV Last pap: see HPI History of abnormal pap smears: yes, see HPI  SCREENING STUDIES:  Last mammogram: 2025  Last colonoscopy: 2025  MEDICATIONS: Outpatient Encounter Medications as of 03/23/2024  Medication Sig   senna-docusate (SENOKOT-S) 8.6-50 MG tablet Take 2 tablets by mouth at bedtime. For AFTER surgery, do not take if having diarrhea   traMADol  (ULTRAM ) 50 MG tablet Take 1 tablet (50 mg total) by mouth every 6 (six) hours as needed for moderate pain (pain score 4-6). For AFTER surgery only, do not take and drive   [DISCONTINUED] metoprolol  succinate (TOPROL -XL) 50 MG 24 hr tablet TAKE 1 TABLET DAILY WITH OR IMMEDIATELY FOLLOWING A MEAL   estradiol  (ESTRACE ) 0.5 MG tablet Take 1 tablet (0.5 mg total) by mouth daily.   meloxicam  (MOBIC ) 15 MG tablet Take 1 tablet (15 mg total) by mouth daily with a meal   omeprazole  (PRILOSEC) 20 MG capsule Take  1 capsule (20 mg total) by mouth as needed.   progesterone  (PROMETRIUM ) 100 MG capsule Take 1 capsule by mouth nightly on days 1-14 of each month.   SUMAtriptan  (IMITREX ) 100 MG tablet Take 1 tablet (100 mg total) by mouth once as needed for up to 1 dose for migraine. May repeat in 2 hours if headache persists or recurs.   [DISCONTINUED] losartan  (COZAAR ) 25 MG tablet Take 1 tablet (25 mg total) by mouth daily. (Patient not taking: Reported on 02/28/2024)   [DISCONTINUED] methocarbamol  (ROBAXIN ) 500 MG tablet Take 1 tablet (500 mg total) by mouth 2 (two) times daily. (Patient not taking: Reported on 02/28/2024)   [DISCONTINUED] misoprostol  (CYTOTEC ) 200 MCG tablet Place 1 tablet vaginally pm and morning of procedure   No facility-administered encounter medications on file as of 03/23/2024.    ALLERGIES:  Allergies  Allergen Reactions   Nickel Rash   Strawberry Extract Anaphylaxis and Rash     FAMILY HISTORY:  Family History  Adopted: Yes     SOCIAL HISTORY:  Social Connections: Moderately Integrated (03/05/2023)   Social Connection and Isolation Panel    Frequency of Communication with Friends and Family: More than three times a week    Frequency of Social Gatherings with Friends  and Family: Three times a week    Attends Religious Services: More than 4 times per year    Active Member of Clubs or Organizations: Yes    Attends Banker Meetings: More than 4 times per year    Marital Status: Widowed    REVIEW OF SYSTEMS:  Denies appetite changes, fevers, chills, fatigue, unexplained weight changes. Denies hearing loss, neck lumps or masses, mouth sores, ringing in ears or voice changes. Denies cough or wheezing.  Denies shortness of breath. Denies chest pain or palpitations. Denies leg swelling. Denies abdominal distention, pain, blood in stools, constipation, diarrhea, nausea, vomiting, or early satiety. Denies pain with intercourse, dysuria, frequency, hematuria or  incontinence. Denies hot flashes, pelvic pain, vaginal bleeding or vaginal discharge.   Denies joint pain, back pain or muscle pain/cramps. Denies itching, rash, or wounds. Denies dizziness, headaches, numbness or seizures. Denies swollen lymph nodes or glands, denies easy bruising or bleeding. Denies anxiety, depression, confusion, or decreased concentration.  Physical Exam:  Vital Signs for this encounter:  Blood pressure 136/84, pulse 67, temperature 98.6 F (37 C), temperature source Oral, resp. rate 20, height 5' 5 (1.651 m), weight 246 lb (111.6 kg), SpO2 97%. Body mass index is 40.94 kg/m. General: Alert, oriented, no acute distress.  HEENT: Normocephalic, atraumatic. Sclera anicteric.  Chest: Clear to auscultation bilaterally. No wheezes, rhonchi, or rales. Cardiovascular: Regular rate and rhythm, no murmurs, rubs, or gallops.  Abdomen: Obese. Normoactive bowel sounds. Soft, nondistended, nontender to palpation. No masses or hepatosplenomegaly appreciated. No palpable fluid wave.  Extremities: Grossly normal range of motion. Warm, well perfused. No edema bilaterally.  Skin: No rashes or lesions.  Lymphatics: No cervical, supraclavicular, or inguinal adenopathy.  GU:  Normal external female genitalia. No lesions. No discharge or bleeding.             Bladder/urethra:  No lesions or masses, well supported bladder             Vagina: Rugated.  Small, approximately 1 x 1 cm area with atrophic appearance and mild friability at the left lateral vaginal apex, inferior and lateral to the cervix.  No other vaginal lesions noted.             Cervix: Normal appearing, no lesions.  Cervical stenosis appreciated.             Uterus: Small, mobile, no parametrial involvement or nodularity.             Adnexa: No masses appreciated.  Rectal: Deferred.  LABORATORY AND RADIOLOGIC DATA:  Outside medical records were reviewed to synthesize the above history, along with the history and physical  obtained during the visit.   Lab Results  Component Value Date   WBC 6.5 12/24/2020   HGB 12.4 12/24/2020   HCT 34.6 (L) 12/24/2020   PLT 273.0 12/24/2020   GLUCOSE 92 03/04/2023   CHOL 179 03/04/2023   TRIG 116.0 03/04/2023   HDL 40.80 03/04/2023   LDLCALC 115 (H) 03/04/2023   ALT 19 03/04/2023   AST 18 03/04/2023   NA 141 03/04/2023   K 4.3 03/04/2023   CL 108 03/04/2023   CREATININE 0.83 03/04/2023   BUN 18 03/04/2023   CO2 25 03/04/2023   TSH 1.72 12/24/2020   INR 1.1 (H) 12/24/2020   HGBA1C 5.3 03/04/2023

## 2024-03-22 NOTE — H&P (View-Only) (Signed)
 GYNECOLOGIC ONCOLOGY NEW PATIENT CONSULTATION   Patient Name: Amanda Mack  Patient Age: 58 y.o. Date of Service: 03/23/24 Referring Provider: Ronal Pinal, MD  Primary Care Provider: [place], Betty G, MD Consulting Provider: Comer Dollar, MD   Assessment/Plan:  Postmenopausal patient with HPV-related vaginal dysplasia.   Reviewed in detail her recent history including Pap test, HPV results, and vaginal biopsy.  She has a remote history of what sounds like cervical dysplasia (although her recollection is that this was HPV unrelated).   Spent some time discussing pathogenesis of cervical, vaginal, and vulvar dysplasia, especially in the setting of HPV infection.  Reviewed that this can be an infection that someone has for years or decades before it causes HPV related disease.  Discussed recommendation to treat high-grade vaginal dysplasia given increased risk of progression to cancer.  We reviewed options from a treatment standpoint including surgical (excision versus CO2 laser) and topical treatments.  Given findings on her exam and location, I recommend that we proceed with surgical excision although will have laser available based on additional findings at the time of her exam under anesthesia.   We discussed the need for close surveillance even with adequate treatment now given the risk of recurrence of HPV- related dysplasia.   We discussed tentative plan to proceed with partial vaginectomy, possible vaginal biopsies and CO2 laser ablation, and endocervical curettage. We reviewed the risks of surgery were discussed in detail and she understands these to include infection; injury to adjacent organs such as bowel, bladder; bleeding which may require blood transfusion; anesthesia risk; thromboembolic events; possible death; unforeseen complications; possible need for re-exploration; medical complications such as heart attack, stroke, pleural effusion and pneumonia. The patient will  receive DVT and antibiotic prophylaxis as indicated. She voiced a clear understanding. She had the opportunity to ask questions. Perioperative instructions were reviewed with her.   A copy of this note was sent to the patient's referring provider.   60 minutes of total time was spent for this patient encounter, including preparation, face-to-face counseling with the patient and coordination of care, and documentation of the encounter.  Comer Dollar, MD  Division of Gynecologic Oncology  Department of Obstetrics and Gynecology  [facility]  ___________________________________________  Chief Complaint: No chief complaint on file.   History of Present Illness:  Amanda Mack is a 58 y.o. y.o. female who is seen in consultation at the request of Dr. Pinal for an evaluation of vaginal dysplasia.  Patient endorses a history of abnormal Pap smear in the age of 60, at which time precancerous versus cancerous cells were seen.  There was some confusion whether this was from the cervix or the uterus.  She remembers being HPV negative at that time.  She underwent colposcopy and ultimately had cervical excision procedure and D&C.  She remembers that there were cancerous cells, does not remember if there was any cancer found.  Denies needing any additional treatment such as radiation or chemotherapy, additional surgeries.  Her follow-up testing was all normal.  She had 1 mildly abnormal Pap smear in the intervening time it was felt to be a lab error as the repeat test came back normal.  Recent cytology history as noted below: 09/2020: Pap NILM, HR HPV negative 01/2024: Pap ASCUS, HPV 16+ 02/28/24: Colposcopy performed with no cervical findings, decreased uptake of Lugols along vaginal wall lateral to the cervix (multifocal, left posterior vagina). ECC attempted but not successful to do cervical stenosis. Vaginal biopsy showed  VaIN 2-3  Today, the patient presents alone.   She notes doing well.  Denies any pelvic or abdominal pain.  Denies any vaginal discharge or bleeding.  Reports baseline bowel function.  Denies any urinary symptoms.  Denies any recent weight changes.  Notes having some autoimmune issues after the death of her husband.  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  PAST MEDICAL HISTORY:  Past Medical History:  Diagnosis Date   Adopted    Allergy    Arthritis    GERD (gastroesophageal reflux disease)    H/O LEEP 1990   Hypertension    Migraines    Routine general medical examination at a health care facility 03/09/2023     PAST SURGICAL HISTORY:  Past Surgical History:  Procedure Laterality Date   CERVICAL CONE BIOPSY     AND d&c   KNEE ARTHROSCOPY Left 2019   SHOULDER SURGERY      OB/GYN HISTORY:  OB History  Gravida Para Term Preterm AB Living  0 0 0 0 0 0  SAB IAB Ectopic Multiple Live Births  0 0 0 0 0    No LMP recorded (lmp unknown). Patient is postmenopausal.  Age at menarche: 79  Age at menopause: 21 Hx of HRT: yes Hx of STDs: HPV Last pap: see HPI History of abnormal pap smears: yes, see HPI  SCREENING STUDIES:  Last mammogram: 2025  Last colonoscopy: 2025  MEDICATIONS: Outpatient Encounter Medications as of 03/23/2024  Medication Sig   senna-docusate (SENOKOT-S) 8.6-50 MG tablet Take 2 tablets by mouth at bedtime. For AFTER surgery, do not take if having diarrhea   traMADol  (ULTRAM ) 50 MG tablet Take 1 tablet (50 mg total) by mouth every 6 (six) hours as needed for moderate pain (pain score 4-6). For AFTER surgery only, do not take and drive   [DISCONTINUED] metoprolol  succinate (TOPROL -XL) 50 MG 24 hr tablet TAKE 1 TABLET DAILY WITH OR IMMEDIATELY FOLLOWING A MEAL   estradiol  (ESTRACE ) 0.5 MG tablet Take 1 tablet (0.5 mg total) by mouth daily.   meloxicam  (MOBIC ) 15 MG tablet Take 1 tablet (15 mg total) by mouth daily with a meal   omeprazole  (PRILOSEC) 20 MG capsule Take  1 capsule (20 mg total) by mouth as needed.   progesterone  (PROMETRIUM ) 100 MG capsule Take 1 capsule by mouth nightly on days 1-14 of each month.   SUMAtriptan  (IMITREX ) 100 MG tablet Take 1 tablet (100 mg total) by mouth once as needed for up to 1 dose for migraine. May repeat in 2 hours if headache persists or recurs.   [DISCONTINUED] losartan  (COZAAR ) 25 MG tablet Take 1 tablet (25 mg total) by mouth daily. (Patient not taking: Reported on 02/28/2024)   [DISCONTINUED] methocarbamol  (ROBAXIN ) 500 MG tablet Take 1 tablet (500 mg total) by mouth 2 (two) times daily. (Patient not taking: Reported on 02/28/2024)   [DISCONTINUED] misoprostol  (CYTOTEC ) 200 MCG tablet Place 1 tablet vaginally pm and morning of procedure   No facility-administered encounter medications on file as of 03/23/2024.    ALLERGIES:  Allergies  Allergen Reactions   Nickel Rash   Strawberry Extract Anaphylaxis and Rash     FAMILY HISTORY:  Family History  Adopted: Yes     SOCIAL HISTORY:  Social Connections: Moderately Integrated (03/05/2023)   Social Connection and Isolation Panel    Frequency of Communication with Friends and Family: More than three times a week    Frequency of Social Gatherings with Friends  and Family: Three times a week    Attends Religious Services: More than 4 times per year    Active Member of Clubs or Organizations: Yes    Attends Banker Meetings: More than 4 times per year    Marital Status: Widowed    REVIEW OF SYSTEMS:  Denies appetite changes, fevers, chills, fatigue, unexplained weight changes. Denies hearing loss, neck lumps or masses, mouth sores, ringing in ears or voice changes. Denies cough or wheezing.  Denies shortness of breath. Denies chest pain or palpitations. Denies leg swelling. Denies abdominal distention, pain, blood in stools, constipation, diarrhea, nausea, vomiting, or early satiety. Denies pain with intercourse, dysuria, frequency, hematuria or  incontinence. Denies hot flashes, pelvic pain, vaginal bleeding or vaginal discharge.   Denies joint pain, back pain or muscle pain/cramps. Denies itching, rash, or wounds. Denies dizziness, headaches, numbness or seizures. Denies swollen lymph nodes or glands, denies easy bruising or bleeding. Denies anxiety, depression, confusion, or decreased concentration.  Physical Exam:  Vital Signs for this encounter:  Blood pressure 136/84, pulse 67, temperature 98.6 F (37 C), temperature source Oral, resp. rate 20, height 5' 5 (1.651 m), weight 246 lb (111.6 kg), SpO2 97%. Body mass index is 40.94 kg/m. General: Alert, oriented, no acute distress.  HEENT: Normocephalic, atraumatic. Sclera anicteric.  Chest: Clear to auscultation bilaterally. No wheezes, rhonchi, or rales. Cardiovascular: Regular rate and rhythm, no murmurs, rubs, or gallops.  Abdomen: Obese. Normoactive bowel sounds. Soft, nondistended, nontender to palpation. No masses or hepatosplenomegaly appreciated. No palpable fluid wave.  Extremities: Grossly normal range of motion. Warm, well perfused. No edema bilaterally.  Skin: No rashes or lesions.  Lymphatics: No cervical, supraclavicular, or inguinal adenopathy.  GU:  Normal external female genitalia. No lesions. No discharge or bleeding.             Bladder/urethra:  No lesions or masses, well supported bladder             Vagina: Rugated.  Small, approximately 1 x 1 cm area with atrophic appearance and mild friability at the left lateral vaginal apex, inferior and lateral to the cervix.  No other vaginal lesions noted.             Cervix: Normal appearing, no lesions.  Cervical stenosis appreciated.             Uterus: Small, mobile, no parametrial involvement or nodularity.             Adnexa: No masses appreciated.  Rectal: Deferred.  LABORATORY AND RADIOLOGIC DATA:  Outside medical records were reviewed to synthesize the above history, along with the history and physical  obtained during the visit.   Lab Results  Component Value Date   WBC 6.5 12/24/2020   HGB 12.4 12/24/2020   HCT 34.6 (L) 12/24/2020   PLT 273.0 12/24/2020   GLUCOSE 92 03/04/2023   CHOL 179 03/04/2023   TRIG 116.0 03/04/2023   HDL 40.80 03/04/2023   LDLCALC 115 (H) 03/04/2023   ALT 19 03/04/2023   AST 18 03/04/2023   NA 141 03/04/2023   K 4.3 03/04/2023   CL 108 03/04/2023   CREATININE 0.83 03/04/2023   BUN 18 03/04/2023   CO2 25 03/04/2023   TSH 1.72 12/24/2020   INR 1.1 (H) 12/24/2020   HGBA1C 5.3 03/04/2023

## 2024-03-23 ENCOUNTER — Inpatient Hospital Stay: Attending: Gynecologic Oncology | Admitting: Gynecologic Oncology

## 2024-03-23 ENCOUNTER — Encounter: Payer: Self-pay | Admitting: Gynecologic Oncology

## 2024-03-23 ENCOUNTER — Inpatient Hospital Stay: Admitting: Gynecologic Oncology

## 2024-03-23 ENCOUNTER — Other Ambulatory Visit (HOSPITAL_BASED_OUTPATIENT_CLINIC_OR_DEPARTMENT_OTHER): Payer: Self-pay

## 2024-03-23 VITALS — BP 136/84 | HR 67 | Temp 98.6°F | Resp 20 | Ht 65.0 in | Wt 246.0 lb

## 2024-03-23 DIAGNOSIS — N893 Dysplasia of vagina, unspecified: Secondary | ICD-10-CM | POA: Insufficient documentation

## 2024-03-23 DIAGNOSIS — Z79899 Other long term (current) drug therapy: Secondary | ICD-10-CM | POA: Insufficient documentation

## 2024-03-23 DIAGNOSIS — Z79890 Hormone replacement therapy (postmenopausal): Secondary | ICD-10-CM | POA: Diagnosis not present

## 2024-03-23 DIAGNOSIS — Z791 Long term (current) use of non-steroidal anti-inflammatories (NSAID): Secondary | ICD-10-CM | POA: Diagnosis not present

## 2024-03-23 DIAGNOSIS — K219 Gastro-esophageal reflux disease without esophagitis: Secondary | ICD-10-CM | POA: Diagnosis not present

## 2024-03-23 DIAGNOSIS — B977 Papillomavirus as the cause of diseases classified elsewhere: Secondary | ICD-10-CM | POA: Diagnosis not present

## 2024-03-23 DIAGNOSIS — Z87410 Personal history of cervical dysplasia: Secondary | ICD-10-CM | POA: Insufficient documentation

## 2024-03-23 DIAGNOSIS — I10 Essential (primary) hypertension: Secondary | ICD-10-CM | POA: Diagnosis not present

## 2024-03-23 DIAGNOSIS — Z6841 Body Mass Index (BMI) 40.0 and over, adult: Secondary | ICD-10-CM | POA: Insufficient documentation

## 2024-03-23 DIAGNOSIS — M1990 Unspecified osteoarthritis, unspecified site: Secondary | ICD-10-CM | POA: Diagnosis not present

## 2024-03-23 MED ORDER — SENNOSIDES-DOCUSATE SODIUM 8.6-50 MG PO TABS
2.0000 | ORAL_TABLET | Freq: Every day | ORAL | 0 refills | Status: DC
  Filled 2024-03-23: qty 30, 15d supply, fill #0

## 2024-03-23 MED ORDER — TRAMADOL HCL 50 MG PO TABS
50.0000 mg | ORAL_TABLET | Freq: Four times a day (QID) | ORAL | 0 refills | Status: DC | PRN
  Filled 2024-03-23: qty 10, 3d supply, fill #0

## 2024-03-23 NOTE — Patient Instructions (Addendum)
 Preparing for your Surgery   Plan for surgery on March 30, 2024 with Dr. Comer Dollar at [facility]. You will be scheduled for examination under anesthesia, wide local excision of the vagina, possible vaginal laser, endocervical curettage (sampling from the inner canal of the cervix) and any other indicated procedures.    Pre-operative Testing -You will receive a phone call from presurgical testing at [facility] to discuss surgery instructions and arrange for lab work if needed.   -Bring your insurance card, copy of an advanced directive if applicable, medication list.   -You should not be taking blood thinners or aspirin at least ten days prior to surgery unless instructed by your surgeon.   -Do not take supplements such as fish oil (omega 3), red yeast rice, turmeric before your surgery. You want to avoid medications with aspirin in them including headache powders such as BC or Goody's), Excedrin migraine.   Please try to avoid use of mobic  for at least 7 days before procedure. You are safe to use tylenol .    Day Before Surgery at Home -You will be advised you can have clear liquids up until 3 hours before your surgery.     Your role in recovery Your role is to become active as soon as directed by your doctor, while still giving yourself time to heal.  Rest when you feel tired. You will be asked to do the following in order to speed your recovery:   - Cough and breathe deeply. This helps to clear and expand your lungs and can prevent pneumonia after surgery.  - STAY ACTIVE WHEN YOU GET HOME. Do mild physical activity. Walking or moving your legs help your circulation and body functions return to normal. Do not try to get up or walk alone the first time after surgery.   -If you develop swelling on one leg or the other, pain in the back of your leg, redness/warmth in one of your legs, please call the office or go to the Emergency Room to have a doppler to rule out a  blood clot. For shortness of breath, chest pain-seek care in the Emergency Room as soon as possible. - Actively manage your pain. Managing your pain lets you move in comfort. We will ask you to rate your pain on a scale of zero to 10. It is your responsibility to tell your doctor or nurse where and how much you hurt so your pain can be treated.   Special Considerations -Your final pathology results from surgery should be available around one week after surgery and the results will be relayed to you when available.   -FMLA forms can be faxed to [phone] and please allow 5-7 business days for completion.   Pain Management After Surgery -You will be prescribed your pain medication (tramadol ) and bowel regimen medications before surgery so that you can have these available when you are discharged from the hospital. The pain medication is for use ONLY AFTER surgery and a new prescription will not be given.    -Make sure that you have Tylenol  at home IF YOU ARE ABLE TO TAKE THESE MEDICATION to use on a regular basis after surgery for pain control. After surgery, you can resume your mobic  as needed. Do not take with ibuprofen. Use one or the other.    -Review the attached handout on narcotic use and their risks and side effects.    Bowel Regimen -You will be prescribed Sennakot-S to take nightly to prevent  constipation especially if you are taking the narcotic pain medication intermittently.  It is important to prevent constipation and drink adequate amounts of liquids. You can stop taking this medication when you are not taking pain medication and you are back on your normal bowel routine.   Risks of Surgery Risks of surgery are low but include bleeding, infection, damage to surrounding structures, re-operation, blood clots, and very rarely death.   AFTER SURGERY INSTRUCTIONS   Return to work:  1-2 days if applicable   Activity: 1. Be up and out of the bed during the day.  Take a nap if  needed.  You may walk up steps but be careful and use the hand rail.  Stair climbing will tire you more than you think, you may need to stop part way and rest.    2. No lifting or straining for 2 weeks over 10 pounds. No pushing, pulling, straining for 2 weeks.   3. No driving for minimum 24 hours after surgery but this is usually longer until the following criteria have been met: Do not drive if you are taking narcotic pain medicine and make sure that your reaction time has returned.    4. You can shower as soon as the next day after surgery. Shower daily. No tub baths or submerging your body in water until cleared by your surgeon. If you have the soap that was given to you by pre-surgical testing that was used before surgery, you do not need to use it afterwards because this can irritate your incisions.    5. No sexual activity and nothing in the vagina for 4-6 weeks.   6. You may experience vaginal spotting and discharge after surgery.  The spotting is normal but if you experience heavy bleeding, call our office.   7. Take Tylenol  or ibuprofen (OR MOBIC , DO NOT TAKE TOGETHER SINCE THEY WORK SIMILARLY) first for pain if you are able to take these medication and only use narcotic pain medication for severe pain not relieved by the Tylenol /Ibup (or mobic ).  Monitor your Tylenol  intake to a max of 4,000 mg in a 24 hour period.    Diet: 1. Low sodium Heart Healthy Diet is recommended but you are cleared to resume your normal (before surgery) diet after your procedure.   2. It is safe to use a laxative, such as Miralax or Colace, if you have difficulty moving your bowels. You have been prescribed Sennakot at bedtime every evening to keep bowel movements regular and to prevent constipation.     Wound Care: 1. Keep clean and dry.  Shower daily.   Reasons to call the Doctor: Fever - Oral temperature greater than 100.4 degrees Fahrenheit Foul-smelling vaginal discharge Difficulty urinating Nausea  and vomiting Increased pain at the site of the incision that is unrelieved with pain medicine. Difficulty breathing with or without chest pain New calf pain especially if only on one side Sudden, continuing increased vaginal bleeding with or without clots.   Contacts: For questions or concerns you should contact:   Dr. Comer Dollar at [phone]   Eleanor Epps, NP at [phone]   After Hours: call [phone] and have the GYN Oncologist paged/contacted (after 5 pm or on the weekends).   Messages sent via mychart are for non-urgent matters and are not responded to after hours so for urgent needs, please call the after hours number.

## 2024-03-23 NOTE — Patient Instructions (Signed)
 Preparing for your Surgery   Plan for surgery on March 30, 2024 with Dr. Comer Dollar at [facility]. You will be scheduled for examination under anesthesia, wide local excision of the vagina, possible vaginal laser, endocervical curettage (sampling from the inner canal of the cervix) and any other indicated procedures.    Pre-operative Testing -You will receive a phone call from presurgical testing at [facility] to discuss surgery instructions and arrange for lab work if needed.   -Bring your insurance card, copy of an advanced directive if applicable, medication list.   -You should not be taking blood thinners or aspirin at least ten days prior to surgery unless instructed by your surgeon.   -Do not take supplements such as fish oil (omega 3), red yeast rice, turmeric before your surgery. You want to avoid medications with aspirin in them including headache powders such as BC or Goody's), Excedrin migraine.   Please try to avoid use of mobic  for at least 7 days before procedure. You are safe to use tylenol .    Day Before Surgery at Home -You will be advised you can have clear liquids up until 3 hours before your surgery.     Your role in recovery Your role is to become active as soon as directed by your doctor, while still giving yourself time to heal.  Rest when you feel tired. You will be asked to do the following in order to speed your recovery:   - Cough and breathe deeply. This helps to clear and expand your lungs and can prevent pneumonia after surgery.  - STAY ACTIVE WHEN YOU GET HOME. Do mild physical activity. Walking or moving your legs help your circulation and body functions return to normal. Do not try to get up or walk alone the first time after surgery.   -If you develop swelling on one leg or the other, pain in the back of your leg, redness/warmth in one of your legs, please call the office or go to the Emergency Room to have a doppler to rule out a  blood clot. For shortness of breath, chest pain-seek care in the Emergency Room as soon as possible. - Actively manage your pain. Managing your pain lets you move in comfort. We will ask you to rate your pain on a scale of zero to 10. It is your responsibility to tell your doctor or nurse where and how much you hurt so your pain can be treated.   Special Considerations -Your final pathology results from surgery should be available around one week after surgery and the results will be relayed to you when available.   -FMLA forms can be faxed to [phone] and please allow 5-7 business days for completion.   Pain Management After Surgery -You will be prescribed your pain medication (tramadol ) and bowel regimen medications before surgery so that you can have these available when you are discharged from the hospital. The pain medication is for use ONLY AFTER surgery and a new prescription will not be given.    -Make sure that you have Tylenol  at home IF YOU ARE ABLE TO TAKE THESE MEDICATION to use on a regular basis after surgery for pain control. After surgery, you can resume your mobic  as needed. Do not take with ibuprofen. Use one or the other.    -Review the attached handout on narcotic use and their risks and side effects.    Bowel Regimen -You will be prescribed Sennakot-S to take nightly to prevent  constipation especially if you are taking the narcotic pain medication intermittently.  It is important to prevent constipation and drink adequate amounts of liquids. You can stop taking this medication when you are not taking pain medication and you are back on your normal bowel routine.   Risks of Surgery Risks of surgery are low but include bleeding, infection, damage to surrounding structures, re-operation, blood clots, and very rarely death.   AFTER SURGERY INSTRUCTIONS   Return to work:  1-2 days if applicable   Activity: 1. Be up and out of the bed during the day.  Take a nap if  needed.  You may walk up steps but be careful and use the hand rail.  Stair climbing will tire you more than you think, you may need to stop part way and rest.    2. No lifting or straining for 2 weeks over 10 pounds. No pushing, pulling, straining for 2 weeks.   3. No driving for minimum 24 hours after surgery but this is usually longer until the following criteria have been met: Do not drive if you are taking narcotic pain medicine and make sure that your reaction time has returned.    4. You can shower as soon as the next day after surgery. Shower daily. No tub baths or submerging your body in water until cleared by your surgeon. If you have the soap that was given to you by pre-surgical testing that was used before surgery, you do not need to use it afterwards because this can irritate your incisions.    5. No sexual activity and nothing in the vagina for 4-6 weeks.   6. You may experience vaginal spotting and discharge after surgery.  The spotting is normal but if you experience heavy bleeding, call our office.   7. Take Tylenol  or ibuprofen (OR MOBIC , DO NOT TAKE TOGETHER SINCE THEY WORK SIMILARLY) first for pain if you are able to take these medication and only use narcotic pain medication for severe pain not relieved by the Tylenol /Ibup (or mobic ).  Monitor your Tylenol  intake to a max of 4,000 mg in a 24 hour period.    Diet: 1. Low sodium Heart Healthy Diet is recommended but you are cleared to resume your normal (before surgery) diet after your procedure.   2. It is safe to use a laxative, such as Miralax or Colace, if you have difficulty moving your bowels. You have been prescribed Sennakot at bedtime every evening to keep bowel movements regular and to prevent constipation.     Wound Care: 1. Keep clean and dry.  Shower daily.   Reasons to call the Doctor: Fever - Oral temperature greater than 100.4 degrees Fahrenheit Foul-smelling vaginal discharge Difficulty urinating Nausea  and vomiting Increased pain at the site of the incision that is unrelieved with pain medicine. Difficulty breathing with or without chest pain New calf pain especially if only on one side Sudden, continuing increased vaginal bleeding with or without clots.   Contacts: For questions or concerns you should contact:   Dr. Comer Dollar at [phone]   Eleanor Epps, NP at [phone]   After Hours: call [phone] and have the GYN Oncologist paged/contacted (after 5 pm or on the weekends).   Messages sent via mychart are for non-urgent matters and are not responded to after hours so for urgent needs, please call the after hours number.

## 2024-03-23 NOTE — Progress Notes (Signed)
 Patient here for new patient consultation and for a pre-operative appointment prior to her scheduled surgery on 03/30/2024. She is scheduled for partial vaginectomy, possible biopsies, possible laser, ECC.     Discussed post-op pain management in detail including the aspects of the enhanced recovery pathway.  Advised her that a new prescription would be sent in for tramadol  and it is only to be used for after her upcoming surgery.  We discussed the use of tylenol  post-op and to monitor for a maximum of 4,000 mg in a 24 hour period.  Also prescribed sennakot to be used after surgery and to hold if having loose stools.  Discussed bowel regimen in detail.     Discussed measures to take at home to prevent DVT including frequent mobility.  Reportable signs and symptoms of DVT discussed. Post-operative instructions discussed and expectations for after surgery. Incisional care discussed as well including reportable signs and symptoms including erythema, drainage, wound separation.     10 minutes spent preparing information and with the patient.  Verbalizing understanding of material discussed. No needs or concerns voiced at the end of the visit.   Advised patient to call for any needs.  Advised that her post-operative medications had been prescribed and could be picked up at any time.    This appointment is included in the global surgical bundle as pre-operative teaching and has no charge.

## 2024-03-24 ENCOUNTER — Encounter (HOSPITAL_BASED_OUTPATIENT_CLINIC_OR_DEPARTMENT_OTHER)

## 2024-03-27 ENCOUNTER — Ambulatory Visit (HOSPITAL_BASED_OUTPATIENT_CLINIC_OR_DEPARTMENT_OTHER): Admitting: Physical Therapy

## 2024-03-27 NOTE — Progress Notes (Addendum)
 Anesthesia Review:  PCP: Cardiologist :  PPM/ ICD: Device Orders: Rep Notified:  Chest x-ray : EKG : Echo : Stress test: Cardiac Cath :   Activity level:  Sleep Study/ CPAP : Fasting Blood Sugar :      / Checks Blood Sugar -- times a day:    Blood Thinner/ Instructions /Last Dose: ASA / Instructions/ Last Dose :    NICKEL ALLERGY

## 2024-03-28 NOTE — Patient Instructions (Signed)
 SURGICAL WAITING ROOM VISITATION  Patients having surgery or a procedure may have no more than 2 support people in the waiting area - these visitors may rotate.    Children under the age of 98 must have an adult with them who is not the patient.  Visitors with respiratory illnesses are discouraged from visiting and should remain at home.  If the patient needs to stay at the hospital during part of their recovery, the visitor guidelines for inpatient rooms apply. Pre-op nurse will coordinate an appropriate time for 1 support person to accompany patient in pre-op.  This support person may not rotate.    Please refer to the [facility] website for the visitor guidelines for Inpatients (after your surgery is over and you are in a regular room).       Your procedure is scheduled on:  03/30/2024    Report to [facility] Main Entrance    Report to admitting at  0515 AM   Call this number if you have problems the morning of surgery [phone]   Do not eat food :After Midnight.   After Midnight you may have the following liquids until _ 0430_____ AM  DAY OF SURGERY  Water Non-Citrus Juices (without pulp, NO RED-Apple, White grape, White cranberry) Black Coffee (NO MILK/CREAM OR CREAMERS, sugar ok)  Clear Tea (NO MILK/CREAM OR CREAMERS, sugar ok) regular and decaf                             Plain Jell-O (NO RED)                                           Fruit ices (not with fruit pulp, NO RED)                                     Popsicles (NO RED)                                                               Sports drinks like Gatorade (NO RED)                            If you have questions, please contact your surgeon's office.       Oral Hygiene is also important to reduce your risk of infection.                                    Remember - BRUSH YOUR TEETH THE MORNING OF SURGERY WITH YOUR REGULAR TOOTHPASTE  DENTURES WILL BE REMOVED PRIOR TO SURGERY PLEASE DO NOT  APPLY Poly grip OR ADHESIVES!!!   Do NOT smoke after Midnight   Stop all vitamins and herbal supplements 7 days before surgery.   Take these medicines the morning of surgery with A SIP OF WATER:  omeprazole  if needed   DO NOT TAKE ANY ORAL DIABETIC MEDICATIONS DAY OF YOUR SURGERY  Bring CPAP mask  and tubing day of surgery.                              You may not have any metal on your body including hair pins, jewelry, and body piercing             Do not wear make-up, lotions, powders, perfumes/cologne, or deodorant  Do not wear nail polish including gel and S&S, artificial/acrylic nails, or any other type of covering on natural nails including finger and toenails. If you have artificial nails, gel coating, etc. that needs to be removed by a nail salon please have this removed prior to surgery or surgery may need to be canceled/ delayed if the surgeon/ anesthesia feels like they are unable to be safely monitored.   Do not shave  48 hours prior to surgery.               Men may shave face and neck.   Do not bring valuables to the hospital. [facility] IS NOT             RESPONSIBLE   FOR VALUABLES.   Contacts, glasses, dentures or bridgework may not be worn into surgery.   Bring small overnight bag day of surgery.   DO NOT BRING YOUR HOME MEDICATIONS TO THE HOSPITAL. PHARMACY WILL DISPENSE MEDICATIONS LISTED ON YOUR MEDICATION LIST TO YOU DURING YOUR ADMISSION IN THE HOSPITAL!    Patients discharged on the day of surgery will not be allowed to drive home.  Someone NEEDS to stay with you for the first 24 hours after anesthesia.   Special Instructions: Bring a copy of your healthcare power of attorney and living will documents the day of surgery if you haven't scanned them before.              Please read over the following fact sheets you were given: IF YOU HAVE QUESTIONS ABOUT YOUR PRE-OP INSTRUCTIONS PLEASE CALL [phone].   If you received a COVID test during your pre-op  visit  it is requested that you wear a mask when out in public, stay away from anyone that may not be feeling well and notify your surgeon if you develop symptoms. If you test positive for Covid or have been in contact with anyone that has tested positive in the last 10 days please notify you surgeon.    [facility] - Preparing for Surgery Before surgery, you can play an important role.  Because skin is not sterile, your skin needs to be as free of germs as possible.  You can reduce the number of germs on your skin by washing with CHG (chlorahexidine gluconate) soap before surgery.  CHG is an antiseptic cleaner which kills germs and bonds with the skin to continue killing germs even after washing. Please DO NOT use if you have an allergy to CHG or antibacterial soaps.  If your skin becomes reddened/irritated stop using the CHG and inform your nurse when you arrive at Short Stay. Do not shave (including legs and underarms) for at least 48 hours prior to the first CHG shower.  You may shave your face/neck. Please follow these instructions carefully:  1.  Shower with CHG Soap the night before surgery and the  morning of Surgery.  2.  If you choose to wash your hair, wash your hair first as usual with your  normal  shampoo.  3.  After you shampoo, rinse your hair  and body thoroughly to remove the  shampoo.                           4.  Use CHG as you would any other liquid soap.  You can apply chg directly  to the skin and wash                       Gently with a scrungie or clean washcloth.  5.  Apply the CHG Soap to your body ONLY FROM THE NECK DOWN.   Do not use on face/ open                           Wound or open sores. Avoid contact with eyes, ears mouth and genitals (private parts).                       Wash face,  Genitals (private parts) with your normal soap.             6.  Wash thoroughly, paying special attention to the area where your surgery  will be performed.  7.  Thoroughly rinse your  body with warm water from the neck down.  8.  DO NOT shower/wash with your normal soap after using and rinsing off  the CHG Soap.                9.  Pat yourself dry with a clean towel.            10.  Wear clean pajamas.            11.  Place clean sheets on your bed the night of your first shower and do not  sleep with pets. Day of Surgery : Do not apply any lotions/deodorants the morning of surgery.  Please wear clean clothes to the hospital/surgery center.  FAILURE TO FOLLOW THESE INSTRUCTIONS MAY RESULT IN THE CANCELLATION OF YOUR SURGERY PATIENT SIGNATURE_________________________________  NURSE SIGNATURE__________________________________  ________________________________________________________________________

## 2024-03-29 ENCOUNTER — Encounter (HOSPITAL_COMMUNITY): Payer: Self-pay

## 2024-03-29 ENCOUNTER — Ambulatory Visit (HOSPITAL_BASED_OUTPATIENT_CLINIC_OR_DEPARTMENT_OTHER): Attending: Orthopedic Surgery | Admitting: Physical Therapy

## 2024-03-29 ENCOUNTER — Other Ambulatory Visit: Payer: Self-pay

## 2024-03-29 ENCOUNTER — Telehealth: Payer: Self-pay | Admitting: *Deleted

## 2024-03-29 ENCOUNTER — Encounter (HOSPITAL_COMMUNITY)
Admission: RE | Admit: 2024-03-29 | Discharge: 2024-03-29 | Disposition: A | Discharge status: Home / Self Care | Source: Ambulatory Visit | Attending: Gynecologic Oncology | Admitting: Gynecologic Oncology

## 2024-03-29 VITALS — Ht 65.0 in | Wt 240.0 lb

## 2024-03-29 DIAGNOSIS — R262 Difficulty in walking, not elsewhere classified: Secondary | ICD-10-CM | POA: Diagnosis not present

## 2024-03-29 DIAGNOSIS — M25661 Stiffness of right knee, not elsewhere classified: Secondary | ICD-10-CM | POA: Insufficient documentation

## 2024-03-29 DIAGNOSIS — M6281 Muscle weakness (generalized): Secondary | ICD-10-CM | POA: Insufficient documentation

## 2024-03-29 DIAGNOSIS — Z01818 Encounter for other preprocedural examination: Secondary | ICD-10-CM

## 2024-03-29 DIAGNOSIS — M79651 Pain in right thigh: Secondary | ICD-10-CM | POA: Insufficient documentation

## 2024-03-29 NOTE — Therapy (Signed)
 OUTPATIENT PHYSICAL THERAPY LOWER EXTREMITY TREATMENT  PROGRESS NOTE    Patient Name: Amanda Mack MRN: [id number] DOB:03-04-1966, 58 y.o., female Today's Date: 03/30/2024  END OF SESSION:  PT End of Session - 03/30/24 1525     Visit Number 16    Number of Visits 23    Date for Recertification  06/07/24    Authorization Type BCBS    PT Start Time 1113    PT Stop Time 1145    PT Time Calculation (min) 32 min    Activity Tolerance Patient tolerated treatment well    Behavior During Therapy WFL for tasks assessed/performed                         Past Medical History:  Diagnosis Date   Adopted    Allergy    Arthritis    GERD (gastroesophageal reflux disease)    H/O LEEP 1990   Migraines    Routine general medical examination at a health care facility 03/09/2023   Past Surgical History:  Procedure Laterality Date   CERVICAL CONE BIOPSY     AND d&c   KNEE ARTHROSCOPY Left 2019   SHOULDER SURGERY     Patient Active Problem List   Diagnosis Date Noted   Vaginal dysplasia 03/30/2024   Routine general medical examination at a health care facility 03/09/2023   Menopausal symptoms 08/13/2022   Hyperlipidemia 09/23/2020   Vitamin D  deficiency, unspecified 03/01/2019   Sinus tachycardia 09/29/2016   Essential hypertension 04/17/2016   Class 3 severe obesity with serious comorbidity and body mass index (BMI) of 40.0 to 44.9 in adult [facility]) 04/17/2016     REFERRING PROVIDER: Bissell, Jaclyn, PA-C  REFERRING DIAG: Pain in R lower limb, M79.604  THERAPY DIAG:  Muscle weakness (generalized)  Pain in right thigh  Difficulty in walking, not elsewhere classified  Stiffness of right knee, not elsewhere classified  Rationale for Evaluation and Treatment: Rehabilitation  ONSET DATE: 12/17/2023 and 01/02/2024  SUBJECTIVE:   SUBJECTIVE STATEMENT:  Pt has been in remission for cervical and uterine CA for 35 years though has now returned.  Pt is  having surgery tomorrow.  Pt states she will not be able to get back into the water for about 6 weeks.  Pt states she doesn't think she has any exercise restrictions except no lifting over 10 lbs.  PT instructed pt she will need to get clearance from surgeon about when to return to PT.       Pt did a lot of walking at a car show and the farmer's market on Saturday and felt good afterwards.  She didn't use an AD.  She had some tightness in R calf.  She is not taking anti-inflammatories currently due to her surgery.  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  She didn't perform any jumping.  Her knee is getting straighter.  She continued to have tightness in HS.  Pt has been using the crutch when outside of home and work.      PERTINENT HISTORY: Bilat knee OA, L knee arthroscopy 2013 Hx of SI pain R Shoulder surgery HTN  PAIN:  NPRS:  0/10  now, 1/10 worst Location:  distal R HS   Type:  Achy   PRECAUTIONS: Other: knee OA    WEIGHT BEARING RESTRICTIONS: No  FALLS:  Has patient fallen in last 6 months? 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   PLOF: Independent  PATIENT GOALS: to remove crutches and walk normally   OBJECTIVE:  Note: Objective measures were completed at Evaluation unless otherwise noted.  DIAGNOSTIC FINDINGS: X rays per PA note:  tricompartmental degenerative changes present without acute displaced fracture or dislocation identified.  When compared to radiographs from Nov 2024, no significant changes identified.  PATIENT SURVEYS:  LEFS  Extreme difficulty/unable (0), Quite a bit of difficulty (1), Moderate difficulty (2), Little difficulty (3), No difficulty (4) Survey date:  01/06/24 02/22/24 03/29/24  Any of your usual work, housework or school  activities 0 3 3  2. Usual hobbies, recreational or sporting activities 0 0 0  3. Getting into/out of the bath 3 4 4   4. Walking between rooms 3 3.5 4  5. Putting on socks/shoes 2 4 4   6. Squatting  0 0 0  7. Lifting an object, like a bag of groceries from the floor 0 3 4  8. Performing light activities around your home 0 3.5 4  9. Performing heavy activities around your home 0 1.5 2  10. Getting into/out of a car 2 4 4   11. Walking 2 blocks 0 1 3.5  12. Walking 1 mile 0 0 0  13. Going up/down 10 stairs (1 flight) 0 3.5 4  14. Standing for 1 hour 0 2 1  15.  sitting for 1 hour 0 4 4  16. Running on even ground 0 0 0  17. Running on uneven ground 0 0 0  18. Making sharp turns while running fast 0 0 0  19. Hopping  0 0 0  20. Rolling over in bed 4 4 4   Score total:  14/80 40/80 45.5/80     COGNITION: Overall cognitive status: Within functional limits for tasks assessed      PALPATION: TTP:  distal HS and mid HS.  medial and lateral R knee   LOWER EXTREMITY ROM:   ROM Right eval Left Eval AROM Right 9/2 Right 10/8 Left 10/8  Hip flexion       Hip extension       Hip abduction       Hip adduction       Hip internal rotation       Hip external rotation       Knee flexion 109 PROM 129 121 127   Knee extension 0 deg in resting 7 3 4  Lacking 9 deg  Ankle dorsiflexion       Ankle plantarflexion       Ankle inversion       Ankle eversion        (Blank rows = not tested)  LOWER EXTREMITY MMT:  MMT Right eval Left eval Right 9/2 Right 10/8  Hip flexion      Hip extension      Hip abduction   5/5 5/5  Hip adduction      Hip internal rotation      Hip external rotation      Knee flexion   Pt able to perform prone knee flexion AROM without pain.  Pt tolerated minimal resistance well without pain. 4+/5  Knee extension      Ankle dorsiflexion      Ankle plantarflexion      Ankle inversion      Ankle eversion       (  Blank rows = not tested)   GAIT: Comments:  Pt ambulated without crutch.  Pt had good stability.  She denies any pain, but reports tightness.  Pt is not limping, though slightly favors it.  Pt has decrease gait speed.                                                                                                                                  TREATMENT:   10/8 Reviewed pt presentation, current function, and pain levels.  Assessed strength, gait, and tenderness to palpation.  Reviewed goals and educated pt in progress. PT educated pt concerning POC.   Pt completed LEFS.    03/21/24 Sci-fit bike L5 x9min  Gait without crutch Hip hinges x10 Deadlifts 10lb KB 2x10 Thomas stretch 3x30sec bil Prone HSC 5# 2x10bil Cable walks 25lbs fwd and back x5ea Sit to stands 2x10  Side lunges (partial)  (some knee pain so stopped)  03/14/24: Sci-fit bike L5 x57min  Gait without crutch Standing hip swings fwd/back x15ea Hip hinges x7 Standing HSC unresisted Thomas stretch 3x20sec bil Prone HSC 5# 2x15bil LAQ 5# 3 hold x20ea Cable walks 20-25lbs fwd and back x5ea Sit to stands 2x10   03/07/24 Scifit bike L3x5 minutes Pt ambulated 300 ft without crutch  bridges x10 Staggered bridges 2x10  Prone HS curls R 7.5# 2x12 Sidestepping x 10 reps and with RTB around LE's above knees 2x10 Braiding 3x10 Tandem stance 2x30 sec R LE back SLS with UE support 2x10 sec  CP to post knee and distal HS in supine x 10 mins (unbilled time)  02/25/24  Scifit bike L5x5 minutes Gait training without crutch 2X 240 feet  Shuttle BLE press 87# 2x15ea Shuttle LE press single leg 50# 2x15ea Walking bridges 2x10 Staggered bridges 2x10 B Prone HS curls R 7.5# 2x12 Thomas stretch 2x 20sec Hip flexor stretch at stair 30 seconds x 2 each Hamstring stretch at stair 30 seconds x 2 each  02/22/24: Scift bike x 5 mins Bridges x 10 Walking bridges x 10 Prone eccentric HS with manual resistance 2x5 Sidestepping x 3 laps at rail Braiding x 3 laps at  rail  PT assessed gait, palpation, ROM, and strength.  See above. Pt completed LEFS.  See above   PATIENT EDUCATION:  Education details: relevant anatomy, rationale of interventions, POC, dx, objective findings, progress, prognosis, gait, crutch usage, and HEP.  PT answered questions. Person educated: Patient Education method:  Explanation, Demonstration Education comprehension: verbalized understanding, returned demonstration  HOME EXERCISE PROGRAM:   ASSESSMENT:  CLINICAL IMPRESSION:  She is 12 weeks and 3 days s/p injury.  Pt is progressing with strength, tolerance to activity, pain, and mobility.  Pt denies pain currently and reports 1/10 worst pain.  Pt is using 1 crutch outside of home and work.  Pt tolerated much increased ambulation distance this past weekend well.  She ambulated at a car show and the farmer's market  without her crutch.  Pt reports no pain afterwards, just tightness in R calf.  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  Pt demonstrates improved HS strength to 4+/5 though continues to have limited knee extension AROM.  Pt continues to have tenderness in HS.  Pt does slightly favor R LE though has no limp with gait.  Pt demonstrates improved self perceived disability as evidenced by LEFS score though not clinically significant.  Pt is having surgery tomorrow relating to CA and may be put on hold until clearance from surgeon.  There has not been much change to goals since last PN, though pt did meet LTG #4.    OBJECTIVE IMPAIRMENTS: Abnormal gait, decreased activity tolerance, decreased endurance, decreased mobility, difficulty walking, decreased ROM, decreased strength, hypomobility, increased fascial restrictions, impaired flexibility, and pain.   ACTIVITY LIMITATIONS: carrying, bending, standing, squatting, stairs, transfers, and locomotion level, dressing, bathing  PARTICIPATION LIMITATIONS: meal prep, cleaning,  laundry, driving, shopping, and community activity  PERSONAL FACTORS: 1-2 comorbidities: bilat knee OA, Hx of SI pain are also affecting patient's functional outcome.   REHAB POTENTIAL: Good  CLINICAL DECISION MAKING: Stable/uncomplicated  EVALUATION COMPLEXITY: Low   GOALS  SHORT TERM GOALS:   Pt will be independent and compliant with HEP for improved pain, strength, ROM, and function.  Baseline: Goal status: MET 8/12 Target date:  01/27/2024  2.  Pt will wean out of KI and off crutches without adverse effects.   Baseline:  Goal status: PARTIALLY MET  9/2--still progressing  10/8 Target date:  01/27/2024  3.  Pt will report she is able to ambulate community distance without any feelings of instability.  Baseline:  Goal status:  PARTIALLY MET  9/2; no feelings of instability, but using crutch Target date: 02/03/2024  4.  Pt will ambulate with a normalized heel to toe gait pattern without limping.  Baseline:  Goal status:  PROGRESSING  9/2 Target date:  02/10/2024  5.  Pt will be able to perform her self care activities and shower transfers without increased pain and difficulty.  Baseline:  Goal status: GOAL MET   9/2 Target date:  02/17/2024  6.  Pt will be able to tolerate exercises without adverse effects for improved strength and proprioception to assist with returning to PLOF.  Baseline:  Goal status: GOAL MET  9/2 Target date:  02/10/2024  LONG TERM GOALS: Target date: 06/07/2024  Pt will be able to perform stairs with a reciprocal gait with good control without difficulty.  Baseline:  Goal status:  not assessed  2.  Pt will be able to perform her household chores without significant pain and difficulty.  Baseline:  Goal status: GOAL MET  9/2  3.  Pt will ambulate extended community distance without significant pain and difficulty.  Baseline:  Goal status:  PROGRESSING  4.  Pt will demo at least a 4 to 4+/5 MMT strength in R HS for improved performance of  functional mobility and tolerance of daily mobility.  Baseline:  Goal status:  GOAL MET  10/8     PLAN:  PT FREQUENCY: 1-2x/wk   PT DURATION: 10 weeks  PLANNED INTERVENTIONS: 97164- PT Re-evaluation, 97750- Physical Performance Testing, 97110-Therapeutic exercises, 97530- Therapeutic activity, 97112- Neuromuscular re-education, 97535- Self Care, 02859- Manual therapy, [phone]- Gait training, [phone]- Aquatic Therapy, [phone]- Electrical stimulation (unattended), [phone]- Electrical stimulation (manual), [id number]- Ultrasound, Patient/Family education, Balance training, Stair training, Taping, Joint mobilization, Spinal mobilization, DME instructions, Cryotherapy, and Moist  heat  PLAN FOR NEXT SESSION:  Pt will discuss with MD about when it is appropriate to return to PT and restrictions.  PT informed pt she needs clearance from surgeon to return to PT.  Pt may be on hold due to surgery/dx and will resume PT when she has clearance.  Amanda Minerva III PT, DPT 03/30/24 9:29 PM

## 2024-03-29 NOTE — Telephone Encounter (Signed)
 Telephone call to check on pre-operative status.  Patient compliant with pre-operative instructions.  Reinforced nothing to eat after midnight. Clear liquids until 0415. Patient to arrive at 0515.  No questions or concerns voiced.  Instructed to call for any needs.

## 2024-03-30 ENCOUNTER — Ambulatory Visit (HOSPITAL_COMMUNITY): Admitting: Anesthesiology

## 2024-03-30 ENCOUNTER — Encounter (HOSPITAL_BASED_OUTPATIENT_CLINIC_OR_DEPARTMENT_OTHER): Payer: Self-pay | Admitting: Physical Therapy

## 2024-03-30 ENCOUNTER — Other Ambulatory Visit: Payer: Self-pay

## 2024-03-30 ENCOUNTER — Encounter (HOSPITAL_COMMUNITY): Payer: Self-pay | Admitting: Gynecologic Oncology

## 2024-03-30 ENCOUNTER — Ambulatory Visit (HOSPITAL_COMMUNITY)
Admission: RE | Admit: 2024-03-30 | Discharge: 2024-03-30 | Disposition: A | Discharge status: Home / Self Care | Attending: Gynecologic Oncology | Admitting: Gynecologic Oncology

## 2024-03-30 ENCOUNTER — Encounter (HOSPITAL_COMMUNITY)
Admission: RE | Disposition: A | Discharge status: Home / Self Care | Payer: Self-pay | Source: Home / Self Care | Attending: Gynecologic Oncology

## 2024-03-30 DIAGNOSIS — D071 Carcinoma in situ of vulva: Secondary | ICD-10-CM | POA: Diagnosis not present

## 2024-03-30 DIAGNOSIS — Z01818 Encounter for other preprocedural examination: Secondary | ICD-10-CM

## 2024-03-30 DIAGNOSIS — I10 Essential (primary) hypertension: Secondary | ICD-10-CM | POA: Insufficient documentation

## 2024-03-30 DIAGNOSIS — Z634 Disappearance and death of family member: Secondary | ICD-10-CM | POA: Insufficient documentation

## 2024-03-30 DIAGNOSIS — D072 Carcinoma in situ of vagina: Secondary | ICD-10-CM | POA: Diagnosis not present

## 2024-03-30 DIAGNOSIS — Z79899 Other long term (current) drug therapy: Secondary | ICD-10-CM | POA: Insufficient documentation

## 2024-03-30 DIAGNOSIS — N870 Mild cervical dysplasia: Secondary | ICD-10-CM | POA: Diagnosis not present

## 2024-03-30 DIAGNOSIS — N893 Dysplasia of vagina, unspecified: Secondary | ICD-10-CM | POA: Diagnosis not present

## 2024-03-30 DIAGNOSIS — K219 Gastro-esophageal reflux disease without esophagitis: Secondary | ICD-10-CM | POA: Diagnosis not present

## 2024-03-30 HISTORY — PX: LESION DESTRUCTION: SHX5132

## 2024-03-30 HISTORY — PX: VAGINECTOMY, PARTIAL: SHX6846

## 2024-03-30 LAB — CBC
HCT: 36.7 % (ref 36.0–46.0)
Hemoglobin: 12.4 g/dL (ref 12.0–15.0)
MCH: 31 pg (ref 26.0–34.0)
MCHC: 33.8 g/dL (ref 30.0–36.0)
MCV: 91.8 fL (ref 80.0–100.0)
Platelets: 198 K/uL (ref 150–400)
RBC: 4 MIL/uL (ref 3.87–5.11)
RDW: 12.4 % (ref 11.5–15.5)
WBC: 5 K/uL (ref 4.0–10.5)
nRBC: 0 % (ref 0.0–0.2)

## 2024-03-30 LAB — BASIC METABOLIC PANEL WITH GFR
Anion gap: 10 (ref 5–15)
BUN: 13 mg/dL (ref 6–20)
CO2: 25 mmol/L (ref 22–32)
Calcium: 9.4 mg/dL (ref 8.9–10.3)
Chloride: 109 mmol/L (ref 98–111)
Creatinine, Ser: 0.82 mg/dL (ref 0.44–1.00)
GFR, Estimated: >60 mL/min (ref >60)
Glucose, Bld: 109 mg/dL — ABNORMAL HIGH (ref 70–99)
Potassium: 4.1 mmol/L (ref 3.5–5.1)
Sodium: 144 mmol/L (ref 135–145)

## 2024-03-30 SURGERY — VAGINECTOMY, PARTIAL
Anesthesia: General | Site: Vagina

## 2024-03-30 MED ORDER — MONSELS FERRIC SUBSULFATE EX SOLN
CUTANEOUS | Status: AC
Start: 2024-03-30 — End: 2024-03-30
  Filled 2024-03-30: qty 8

## 2024-03-30 MED ORDER — LIDOCAINE HCL (PF) 1 % IJ SOLN
INTRAMUSCULAR | Status: DC | PRN
  Administered 2024-03-30: 19 mL

## 2024-03-30 MED ORDER — FENTANYL CITRATE (PF) 100 MCG/2ML IJ SOLN
INTRAMUSCULAR | Status: DC | PRN
  Administered 2024-03-30 (×4): 50 ug via INTRAVENOUS

## 2024-03-30 MED ORDER — SCOPOLAMINE 1 MG/3DAYS TD PT72: 1.0000 | MEDICATED_PATCH | TRANSDERMAL | Status: DC

## 2024-03-30 MED ORDER — HYDROMORPHONE HCL 1 MG/ML IJ SOLN: 0.2500 mg | INTRAMUSCULAR | Status: DC | PRN

## 2024-03-30 MED ORDER — ROCURONIUM BROMIDE 10 MG/ML (PF) SYRINGE
PREFILLED_SYRINGE | INTRAVENOUS | Status: AC
  Filled 2024-03-30: qty 10

## 2024-03-30 MED ORDER — SCOPOLAMINE 1 MG/3DAYS TD PT72
1.0000 | MEDICATED_PATCH | TRANSDERMAL | Status: DC
  Administered 2024-03-30: 1 mg via TRANSDERMAL
  Filled 2024-03-30: qty 1

## 2024-03-30 MED ORDER — ACETAMINOPHEN 500 MG PO TABS: 1000.0000 mg | ORAL_TABLET | Freq: Once | ORAL | Status: DC

## 2024-03-30 MED ORDER — LACTATED RINGERS IV SOLN
INTRAVENOUS | Status: DC
Start: 2024-03-30 — End: 2024-03-30

## 2024-03-30 MED ORDER — CHLORHEXIDINE GLUCONATE 0.12 % MT SOLN
15.0000 mL | Freq: Once | OROMUCOSAL | Status: AC
  Administered 2024-03-30: 15 mL via OROMUCOSAL

## 2024-03-30 MED ORDER — ONDANSETRON HCL 4 MG/2ML IJ SOLN
INTRAMUSCULAR | Status: AC
  Filled 2024-03-30: qty 2

## 2024-03-30 MED ORDER — DEXAMETHASONE SODIUM PHOSPHATE 10 MG/ML IJ SOLN
INTRAMUSCULAR | Status: AC
  Filled 2024-03-30: qty 1

## 2024-03-30 MED ORDER — 0.9 % SODIUM CHLORIDE (POUR BTL) OPTIME
TOPICAL | Status: DC | PRN
  Administered 2024-03-30: 1000 mL

## 2024-03-30 MED ORDER — PROPOFOL 10 MG/ML IV BOLUS
INTRAVENOUS | Status: DC | PRN
  Administered 2024-03-30: 200 mg via INTRAVENOUS

## 2024-03-30 MED ORDER — IODINE STRONG (LUGOLS) 5 % PO SOLN
ORAL | Status: AC
  Filled 2024-03-30: qty 1

## 2024-03-30 MED ORDER — IODINE STRONG (LUGOLS) 5 % PO SOLN
ORAL | Status: DC | PRN
  Administered 2024-03-30: 14 mL via ORAL

## 2024-03-30 MED ORDER — 0.9 % SODIUM CHLORIDE (POUR BTL) OPTIME
TOPICAL | Status: DC | PRN
Start: 2024-03-30 — End: 2024-03-30
  Administered 2024-03-30: 1000 mL

## 2024-03-30 MED ORDER — LIDOCAINE HCL 1 % IJ SOLN: INTRAMUSCULAR | Status: DC | PRN

## 2024-03-30 MED ORDER — ACETIC ACID 5 % SOLN
Status: AC
Start: 2024-03-30 — End: 2024-03-30
  Filled 2024-03-30: qty 24

## 2024-03-30 MED ORDER — ACETAMINOPHEN 500 MG PO TABS
1000.0000 mg | ORAL_TABLET | ORAL | Status: AC
  Administered 2024-03-30: 1000 mg via ORAL
  Filled 2024-03-30: qty 2

## 2024-03-30 MED ORDER — LIDOCAINE HCL (CARDIAC) PF 100 MG/5ML IV SOSY
PREFILLED_SYRINGE | INTRAVENOUS | Status: DC | PRN
  Administered 2024-03-30: 100 mg via INTRAVENOUS

## 2024-03-30 MED ORDER — MIDAZOLAM HCL 5 MG/5ML IJ SOLN
INTRAMUSCULAR | Status: DC | PRN
  Administered 2024-03-30: 2 mg via INTRAVENOUS

## 2024-03-30 MED ORDER — ESTRADIOL 0.1 MG/GM VA CREA
TOPICAL_CREAM | VAGINAL | Status: AC
  Filled 2024-03-30: qty 42.5

## 2024-03-30 MED ORDER — DEXAMETHASONE SODIUM PHOSPHATE 10 MG/ML IJ SOLN
4.0000 mg | INTRAMUSCULAR | Status: AC
  Administered 2024-03-30: 10 mg via INTRAVENOUS

## 2024-03-30 MED ORDER — MIDAZOLAM HCL 2 MG/2ML IJ SOLN
INTRAMUSCULAR | Status: AC
  Filled 2024-03-30: qty 2

## 2024-03-30 MED ORDER — PHENYLEPHRINE HCL-NACL 20-0.9 MG/250ML-% IV SOLN
INTRAVENOUS | Status: AC
  Filled 2024-03-30: qty 250

## 2024-03-30 MED ORDER — PROPOFOL 10 MG/ML IV BOLUS
INTRAVENOUS | Status: AC
  Filled 2024-03-30: qty 20

## 2024-03-30 MED ORDER — FENTANYL CITRATE (PF) 100 MCG/2ML IJ SOLN
INTRAMUSCULAR | Status: AC
  Filled 2024-03-30: qty 2

## 2024-03-30 MED ORDER — PROPOFOL 500 MG/50ML IV EMUL
INTRAVENOUS | Status: DC | PRN
  Administered 2024-03-30: 150 ug/kg/min via INTRAVENOUS

## 2024-03-30 MED ORDER — PHENYLEPHRINE 80 MCG/ML (10ML) SYRINGE FOR IV PUSH (FOR BLOOD PRESSURE SUPPORT)
PREFILLED_SYRINGE | INTRAVENOUS | Status: AC
  Filled 2024-03-30: qty 10

## 2024-03-30 MED ORDER — EPHEDRINE 5 MG/ML INJ
INTRAVENOUS | Status: AC
  Filled 2024-03-30: qty 5

## 2024-03-30 MED ORDER — ORAL CARE MOUTH RINSE: 15.0000 mL | Freq: Once | OROMUCOSAL | Status: AC

## 2024-03-30 MED ORDER — DROPERIDOL 2.5 MG/ML IJ SOLN: 0.6250 mg | Freq: Once | INTRAMUSCULAR | Status: DC | PRN

## 2024-03-30 MED ORDER — PROPOFOL 1000 MG/100ML IV EMUL
INTRAVENOUS | Status: AC
  Filled 2024-03-30: qty 100

## 2024-03-30 MED ORDER — ONDANSETRON HCL 4 MG/2ML IJ SOLN
INTRAMUSCULAR | Status: DC | PRN
  Administered 2024-03-30: 4 mg via INTRAVENOUS

## 2024-03-30 MED ORDER — LIDOCAINE HCL (PF) 2 % IJ SOLN
INTRAMUSCULAR | Status: AC
  Filled 2024-03-30: qty 5

## 2024-03-30 MED ORDER — LIDOCAINE HCL (PF) 1 % IJ SOLN
INTRAMUSCULAR | Status: AC
  Filled 2024-03-30: qty 30

## 2024-03-30 SURGICAL SUPPLY — 42 items
BLADE EXTENDED COATED 6.5IN (ELECTRODE) ×1 IMPLANT
BLADE SURG 15 STRL LF DISP TIS (BLADE) ×2 IMPLANT
BLADE SURG SZ11 CARB STEEL (BLADE) IMPLANT
CATH ROBINSON RED A/P 16FR (CATHETERS) IMPLANT
CNTNR URN SCR LID CUP LEK RST (MISCELLANEOUS) ×1 IMPLANT
DEPRESSOR TONGUE 6 IN STERILE (GAUZE/BANDAGES/DRESSINGS) ×2 IMPLANT
DRAPE UNDERBUTTOCKS STRL (DISPOSABLE) IMPLANT
DRSG TELFA 3X8 NADH STRL (GAUZE/BANDAGES/DRESSINGS) ×1 IMPLANT
ELECT BALL LEEP 3MM BLK (ELECTRODE) IMPLANT
GAUZE 4X4 16PLY ~~LOC~~+RFID DBL (SPONGE) ×2 IMPLANT
GLOVE BIO SURGEON STRL SZ 6 (GLOVE) ×4 IMPLANT
GLOVE BIO SURGEON STRL SZ 6.5 (GLOVE) IMPLANT
GOWN STRL REUS W/ TWL LRG LVL3 (GOWN DISPOSABLE) ×2 IMPLANT
KIT BASIN OR (CUSTOM PROCEDURE TRAY) ×1 IMPLANT
KIT TURNOVER KIT A (KITS) ×2 IMPLANT
NDL HYPO 21X1.5 SAFETY (NEEDLE) IMPLANT
NDL HYPO 22X1.5 SAFETY MO (MISCELLANEOUS) IMPLANT
NDL SPNL 22GX7 QUINCKE BK (NEEDLE) IMPLANT
NEEDLE HYPO 21X1.5 SAFETY (NEEDLE) IMPLANT
NEEDLE HYPO 22X1.5 SAFETY MO (MISCELLANEOUS) IMPLANT
NEEDLE SPNL 22GX7 QUINCKE BK (NEEDLE) ×2 IMPLANT
PACK LITHOTOMY IV (CUSTOM PROCEDURE TRAY) IMPLANT
PACK PERINEAL COLD (PAD) ×2 IMPLANT
PACK VAGINAL WOMENS (CUSTOM PROCEDURE TRAY) ×2 IMPLANT
PAD PREP 24X48 CUFFED NSTRL (MISCELLANEOUS) ×2 IMPLANT
PUNCH BIOPSY 3 (MISCELLANEOUS) IMPLANT
SCOPETTES 8 STERILE (MISCELLANEOUS) ×2 IMPLANT
SCRUB CHG 4% DYNA-HEX 4OZ (MISCELLANEOUS) ×2 IMPLANT
SURGILUBE 2OZ TUBE FLIPTOP (MISCELLANEOUS) IMPLANT
SUT VIC AB 0 CT1 36 (SUTURE) IMPLANT
SUT VIC AB 2-0 SH 27XBRD (SUTURE) ×1 IMPLANT
SUT VIC AB 3-0 PS2 18XBRD (SUTURE) IMPLANT
SUT VIC AB 3-0 SH 27X BRD (SUTURE) IMPLANT
SUT VIC AB 4-0 PS2 18 (SUTURE) IMPLANT
SUT VICRYL 0 UR6 27IN ABS (SUTURE) IMPLANT
SYR BULB IRRIG 60ML STRL (SYRINGE) ×2 IMPLANT
SYR CONTROL 10ML LL (SYRINGE) ×2 IMPLANT
TOWEL OR 17X26 10 PK STRL BLUE (TOWEL DISPOSABLE) ×4 IMPLANT
TUBING CONNECTING 10 (TUBING) ×2 IMPLANT
UNDERPAD 30X36 HEAVY ABSORB (UNDERPADS AND DIAPERS) ×2 IMPLANT
VACUUM HOSE 7/8X10 W/ WAND (MISCELLANEOUS) IMPLANT
WATER STERILE IRR 500ML POUR (IV SOLUTION) ×2 IMPLANT

## 2024-03-30 NOTE — Discharge Instructions (Addendum)
 AFTER SURGERY INSTRUCTIONS   Return to work:  1-2 days if applicable   Activity: 1. Be up and out of the bed during the day.  Take a nap if needed.  You may walk up steps but be careful and use the hand rail.  Stair climbing will tire you more than you think, you may need to stop part way and rest.    2. No lifting or straining for 4 weeks over 10 pounds. No pushing, pulling, straining for 4 weeks. You are cleared to resume physical therapy with no lifting over 15 pounds.   3. No driving for minimum 24 hours after surgery but this is usually longer until the following criteria have been met: Do not drive if you are taking narcotic pain medicine and make sure that your reaction time has returned.    4. You can shower as soon as the next day after surgery. Shower daily. No tub baths or submerging your body in water until cleared by your surgeon. If you have the soap that was given to you by pre-surgical testing that was used before surgery, you do not need to use it afterwards because this can irritate your incisions.    5. No sexual activity and nothing in the vagina for 4-6 weeks.   6. You may experience vaginal spotting and discharge after surgery.  The spotting is normal but if you experience heavy bleeding, call our office.   7. Take Tylenol  or ibuprofen (OR MOBIC , DO NOT TAKE TOGETHER SINCE THEY WORK SIMILARLY) first for pain if you are able to take these medication and only use narcotic pain medication for severe pain not relieved by the Tylenol /Ibup (or mobic ).  Monitor your Tylenol  intake to a max of 4,000 mg in a 24 hour period.    Diet: 1. Low sodium Heart Healthy Diet is recommended but you are cleared to resume your normal (before surgery) diet after your procedure.   2. It is safe to use a laxative, such as Miralax or Colace, if you have difficulty moving your bowels. You have been prescribed Sennakot at bedtime every evening to keep bowel movements regular and to prevent  constipation.     Wound Care: 1. Keep clean and dry.  Shower daily.   Reasons to call the Doctor: Fever - Oral temperature greater than 100.4 degrees Fahrenheit Foul-smelling vaginal discharge Difficulty urinating Nausea and vomiting Increased pain at the site of the incision that is unrelieved with pain medicine. Difficulty breathing with or without chest pain New calf pain especially if only on one side Sudden, continuing increased vaginal bleeding with or without clots.   Contacts: For questions or concerns you should contact:   Dr. Comer Dollar at [phone]   Eleanor Epps, NP at [phone]   After Hours: call [phone] and have the GYN Oncologist paged/contacted (after 5 pm or on the weekends).   Messages sent via mychart are for non-urgent matters and are not responded to after hours so for urgent needs, please call the after hours number.

## 2024-03-30 NOTE — Op Note (Signed)
 Operative Report  PATIENT: Amanda Mack  [facility] DATE: 03/30/24  Preop Diagnosis: High grade vaginal dysplasia  Postoperative Diagnosis: same as above  Surgery: Partial simple vaginectomy, endocervical curettage  Surgeons:  Viktoria Crank, MD  Assistant: none  Anesthesia: General   Estimated blood loss: 15 ml  IVF:  see I&O flowsheet  Urine output: n/a   Complications: None apparent  Pathology: left upper vagina at 3 o'clock (removed in multiple specimens), ECC   Operative findings: On EUA, smooth cervix, small mobile uterus. NO palpable vaginal lesions. On speculum exam, area of prior biopsy noted. After application of lugols, several area with decreased uptake all along upper left vaginal apex from 3 - 4 o'clock, the largest measuring 1 cm at site of prior biopsy. No areas of decreased uptake after application of lugols otherwise in the upper vagina after examination of all vaginal walls.   Procedure: The patient was identified in the preoperative holding area. Informed consent was signed on the chart. Patient was seen history was reviewed and exam was performed.   The patient was then taken to the operating room and placed in the supine position with SCD hose on. General anesthesia was then induced without difficulty. She was then placed in the dorsolithotomy position. The perineum was prepped with Betadine. The vagina was prepped with Betadine. The patient was then draped after the prep was dried.   Timeout was performed the patient, procedure, antibiotic, allergy, and length of procedure. Speculum exam was performed with findings above. The cervix was stenotic but able to be gently dilated to allow for insertion of Kavorkian curettage. An Ecc was performed. Lugols was then applied to the entire upper vagina with findings noted above. 1% lidocaine  was then injected in the subepithelial tissue at the left vaginal apex. An Theresia was then used to grasp the first area were  decreased uptake was seen. Monopolar electrocautery with cut was then used to resect the area and the two adjacent smaller lesions. The bovie was used to obtain hemostasis at the surgical bed. The vagina was irrigated and the upper vagina made hemostatic. Two interrupted stitches using 2-0 Vicryl were then placed to reapproximate the upper vaginal tissue lateral to the cervix.   Lugols was reapplied to the entire cervix and upper vagina with no additional areas of decreased uptake noted.  The vagina was irrigated again and good hemostasis noted.  All instrument, suture, laparotomy, Ray-Tec, and needle counts were correct x2. The patient tolerated the procedure well and was taken recovery room in stable condition.  Crank Viktoria MD Gynecologic Oncology

## 2024-03-30 NOTE — Anesthesia Procedure Notes (Signed)
 Procedure Name: LMA Insertion Date/Time: 03/30/2024 7:35 AM  Performed by: Nada Corean CROME, CRNAPre-anesthesia Checklist: Patient identified, Emergency Drugs available, Suction available, Patient being monitored and Timeout performed Patient Re-evaluated:Patient Re-evaluated prior to induction Oxygen Delivery Method: Circle system utilized Preoxygenation: Pre-oxygenation with 100% oxygen Induction Type: IV induction Ventilation: Mask ventilation without difficulty LMA: LMA inserted LMA Size: 4.0 Number of attempts: 1 Placement Confirmation: ETT inserted through vocal cords under direct vision, positive ETCO2 and breath sounds checked- equal and bilateral Tube secured with: Tape Dental Injury: Teeth and Oropharynx as per pre-operative assessment

## 2024-03-30 NOTE — Anesthesia Postprocedure Evaluation (Signed)
 Anesthesia Post Note  Patient: Amanda Mack  Procedure(s) Performed: VAGINECTOMY, PARTIAL (Vagina ) DESTRUCTION, LESION, GENITALIA (Vagina )     Patient location during evaluation: PACU Anesthesia Type: General Level of consciousness: awake and alert Pain management: pain level controlled Vital Signs Assessment: post-procedure vital signs reviewed and stable Respiratory status: spontaneous breathing, nonlabored ventilation and respiratory function stable Cardiovascular status: blood pressure returned to baseline Postop Assessment: no apparent nausea or vomiting Anesthetic complications: no   No notable events documented.  Last Vitals:  Vitals:   03/30/24 0900 03/30/24 0934  BP: (!) 148/78 (!) 169/94  Pulse: 74 78  Resp: (!) 22 17  Temp:  36.8 C  SpO2: 98% 98%    Last Pain:  Vitals:   03/30/24 0934  TempSrc:   PainSc: 0-No pain                 Vertell Row

## 2024-03-30 NOTE — Anesthesia Preprocedure Evaluation (Addendum)
 Anesthesia Evaluation  Patient identified by MRN, date of birth, ID band Patient awake    Reviewed: Allergy & Precautions, NPO status , Patient's Chart, lab work & pertinent test results, reviewed documented beta blocker date and time   History of Anesthesia Complications Negative for: history of anesthetic complications  Airway Mallampati: II  TM Distance: >3 FB Neck ROM: Full    Dental no notable dental hx.    Pulmonary neg pulmonary ROS   Pulmonary exam normal        Cardiovascular hypertension, Pt. on home beta blockers and Pt. on medications Normal cardiovascular exam     Neuro/Psych  Headaches    GI/Hepatic Neg liver ROS,GERD  Controlled,,  Endo/Other  negative endocrine ROS    Renal/GU negative Renal ROS     Musculoskeletal  (+) Arthritis ,    Abdominal   Peds  Hematology negative hematology ROS (+)   Anesthesia Other Findings   Reproductive/Obstetrics                              Anesthesia Physical Anesthesia Plan  ASA: 2  Anesthesia Plan: General   Post-op Pain Management: Tylenol  PO (pre-op)*   Induction: Intravenous  PONV Risk Score and Plan: 4 or greater and Treatment may vary due to age or medical condition, Ondansetron, Dexamethasone , Midazolam, Scopolamine patch - Pre-op, Propofol infusion and TIVA  Airway Management Planned: LMA  Additional Equipment: None  Intra-op Plan:   Post-operative Plan: Extubation in OR  Informed Consent: I have reviewed the patients History and Physical, chart, labs and discussed the procedure including the risks, benefits and alternatives for the proposed anesthesia with the patient or authorized representative who has indicated his/her understanding and acceptance.     Dental advisory given  Plan Discussed with: CRNA  Anesthesia Plan Comments:          Anesthesia Quick Evaluation

## 2024-03-30 NOTE — Transfer of Care (Signed)
 Immediate Anesthesia Transfer of Care Note  Patient: Amanda Mack  Procedure(s) Performed: VAGINECTOMY, PARTIAL DESTRUCTION, LESION, GENITALIA  Patient Location: PACU  Anesthesia Type:General  Level of Consciousness: awake, alert , oriented, and patient cooperative  Airway & Oxygen Therapy: Patient Spontanous Breathing and Patient connected to face mask oxygen  Post-op Assessment: Report given to RN and Post -op Vital signs reviewed and stable  Post vital signs: Reviewed and stable  Last Vitals:  Vitals Value Taken Time  BP 121/78 03/30/24 08:36  Temp    Pulse 80 03/30/24 08:38  Resp 17 03/30/24 08:38  SpO2 100 % 03/30/24 08:38  Vitals shown include unfiled device data.  Last Pain:  Vitals:   03/30/24 0635  TempSrc:   PainSc: 0-No pain         Complications: No notable events documented.

## 2024-03-30 NOTE — Interval H&P Note (Signed)
 History and Physical Interval Note:  03/30/2024 6:57 AM  Amanda Mack  has presented today for surgery, with the diagnosis of vaginal dysplasia.  The various methods of treatment have been discussed with the patient and family. After consideration of risks, benefits and other options for treatment, the patient has consented to  Procedure(s) with comments: VAGINECTOMY, PARTIAL (N/A) - endocervical curettage DESTRUCTION, LESION, GENITALIA (N/A) CO2 LASER APPLICATION (N/A) - to the vagina as a surgical intervention.  The patient's history has been reviewed, patient examined, no change in status, stable for surgery.  I have reviewed the patient's chart and labs.  Questions were answered to the patient's satisfaction.     [name]

## 2024-03-31 ENCOUNTER — Telehealth: Payer: Self-pay

## 2024-03-31 ENCOUNTER — Encounter (HOSPITAL_COMMUNITY): Payer: Self-pay | Admitting: Gynecologic Oncology

## 2024-03-31 ENCOUNTER — Ambulatory Visit: Payer: Self-pay | Admitting: Gynecologic Oncology

## 2024-03-31 LAB — SURGICAL PATHOLOGY

## 2024-03-31 NOTE — Telephone Encounter (Signed)
 S/P  Partial simple vaginectomy, endocervical curettage  10/9 with Dr.Tucker  Spoke with Ms. Puls this morning. She states she is eating, drinking and urinating well. She has not had a BM yet but is passing gas. She is taking senokot as prescribed and encouraged her to drink plenty of water. She denies fever or chills. Incisions are dry and intact. She rates her pain 0/10.     Instructed to call office with any fever, chills, purulent drainage, uncontrolled pain or any other questions or concerns. Patient verbalizes understanding.   Pt aware of post op appointments as well as the office number [phone] and after hours number [phone] to call if she has any questions or concerns

## 2024-04-07 ENCOUNTER — Telehealth: Payer: Self-pay | Admitting: *Deleted

## 2024-04-07 NOTE — Telephone Encounter (Signed)
 Spoke with Ms. Molden in regards to her MyChart message she sent about a clear with slight yellow tint discharge that she is experiencing.   Pt is approximately 1 week post op from her vaginectomy with Dr. Viktoria. Advised patient that a clear or even slightly bloody discharge can be normal and may continue for several weeks as you heal. Advised patient to call if the discharge becomes thick, cloudy, foul-smelling, or is accompanied by increasing pain or fever, as these could be signs of infection.   Pt verbalized understanding and thanked the office for calling. Pt also reminded of her post op appt.with Dr. Viktoria on 11/13 at 3:45.

## 2024-04-10 ENCOUNTER — Encounter (HOSPITAL_BASED_OUTPATIENT_CLINIC_OR_DEPARTMENT_OTHER): Admitting: Physical Therapy

## 2024-04-10 NOTE — Telephone Encounter (Signed)
 Spoke with Amanda Mack who states the clear drainage became less over the weekend and she is only wearing panty liners and they are not saturated.  Pt is aware to call the office if the drainage increases, changes color or she develops fever, chills and or pain. Pt thanked the office for calling.

## 2024-04-11 ENCOUNTER — Ambulatory Visit (HOSPITAL_BASED_OUTPATIENT_CLINIC_OR_DEPARTMENT_OTHER): Admitting: Physical Therapy

## 2024-04-11 DIAGNOSIS — M6281 Muscle weakness (generalized): Secondary | ICD-10-CM

## 2024-04-11 DIAGNOSIS — M25661 Stiffness of right knee, not elsewhere classified: Secondary | ICD-10-CM

## 2024-04-11 DIAGNOSIS — M79651 Pain in right thigh: Secondary | ICD-10-CM | POA: Diagnosis not present

## 2024-04-11 DIAGNOSIS — R262 Difficulty in walking, not elsewhere classified: Secondary | ICD-10-CM

## 2024-04-11 NOTE — Therapy (Signed)
 OUTPATIENT PHYSICAL THERAPY LOWER EXTREMITY TREATMENT     Patient Name: Amanda Mack MRN: [id number] DOB:09-24-1965, 58 y.o., female Today's Date: 04/12/2024  END OF SESSION:  PT End of Session - 04/11/24 1414     Visit Number 17    Number of Visits 23    Date for Recertification  06/07/24    Authorization Type [employer]    PT Start Time 1412    PT Stop Time 1448    PT Time Calculation (min) 36 min    Activity Tolerance Patient tolerated treatment well    Behavior During Therapy WFL for tasks assessed/performed                          Past Medical History:  Diagnosis Date   Adopted    Allergy    Arthritis    GERD (gastroesophageal reflux disease)    H/O LEEP 1990   Migraines    Routine general medical examination at a health care facility 03/09/2023   Past Surgical History:  Procedure Laterality Date   CERVICAL CONE BIOPSY     AND d&c   KNEE ARTHROSCOPY Left 2019   LESION DESTRUCTION N/A 03/30/2024   Procedure: DESTRUCTION, LESION, GENITALIA;  Surgeon: Viktoria Comer SAUNDERS, MD;  Location: [facility];  Service: Gynecology;  Laterality: N/A;   SHOULDER SURGERY     VAGINECTOMY, PARTIAL N/A 03/30/2024   Procedure: VAGINECTOMY, PARTIAL;  Surgeon: Viktoria Comer SAUNDERS, MD;  Location: [facility];  Service: Gynecology;  Laterality: N/A;  endocervical curettage   Patient Active Problem List   Diagnosis Date Noted   Vaginal dysplasia 03/30/2024   Routine general medical examination at a health care facility 03/09/2023   Menopausal symptoms 08/13/2022   Hyperlipidemia 09/23/2020   Vitamin D  deficiency, unspecified 03/01/2019   Sinus tachycardia 09/29/2016   Essential hypertension 04/17/2016   Class 3 severe obesity with serious comorbidity and body mass index (BMI) of 40.0 to 44.9 in adult [facility]) 04/17/2016     REFERRING PROVIDER: Bissell, Jaclyn, PA-C  REFERRING DIAG: Pain in R lower limb, M79.604  THERAPY DIAG:  Muscle weakness (generalized)  Pain in  right thigh  Difficulty in walking, not elsewhere classified  Stiffness of right knee, not elsewhere classified  Rationale for Evaluation and Treatment: Rehabilitation  ONSET DATE: 12/17/2023 and 01/02/2024  SUBJECTIVE:   SUBJECTIVE STATEMENT:  Pt underwent Partial simple vaginectomy, endocervical curettage on 03/30/24.  Pt is 12 days post op.  Pt brought in a note from  NP---- stating that pt may resume PT as soon as 03/31/24 with the restriction of no heavy lifting over 15 lbs until 04/27/24.  Ask about CA and stitches----biopsy resutlts????----Pt states the pathology is not alarming and margins looked good.  Pt has no stitches, but 2 internal dissolving sutures.  Pt denies any bleeding.  Pt returns to surgeon on 11/13.   Pt states she can't go into the pool until after she sees MD on 11/13.    Pt reports she felt R knee stiffness and had swelling knee when walking downhill without the crutch the Saturday before last.  She used which helped.  Pt states her R knee is bothering her more than the HS.  She is having pain in medial and lateral R knee.  She has pain for the 1st 2-3 steps with ambulation and then the pain goes away.  Knee feels so tight.  Pt states it feels good to walk.  Pt denies any adverse  effects after prior treatment.       has been in remission for cervical and uterine CA for 35 years though has now returned.  Pt is having surgery tomorrow.  Pt states she will not be able to get back into the water for about 6 weeks.  Pt states she doesn't think she has any exercise restrictions except no lifting over 10 lbs.  PT instructed pt she will need to get clearance from surgeon about when to return to PT.       Pt did a lot of walking at a car show and the farmer's market on Saturday and felt good afterwards.  She didn't use an AD.  She had some tightness in R calf.  She is not taking anti-inflammatories currently due to her surgery.  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  She didn't perform any jumping.  Her knee is getting straighter.  She continued to have tightness in HS.  Pt has been using the crutch when outside of home and work.      PERTINENT HISTORY: Bilat knee OA, L knee arthroscopy 2013 Hx of SI pain R Shoulder surgery HTN  PAIN:  NPRS:  0/10  now, 1/10 worst Location:  distal R HS and knee Type:  Achy   PRECAUTIONS: Other: knee OA    WEIGHT BEARING RESTRICTIONS: No  FALLS:  Has patient fallen in last 6 months? 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   PLOF: Independent  PATIENT GOALS: to remove crutches and walk normally   OBJECTIVE:  Note: Objective measures were completed at Evaluation unless otherwise noted.  DIAGNOSTIC FINDINGS: X rays per PA note:  tricompartmental degenerative changes present without acute displaced fracture or dislocation identified.  When compared to radiographs from Nov 2024, no significant changes identified.  PATIENT SURVEYS:  LEFS  Extreme difficulty/unable (0), Quite a bit of difficulty (1), Moderate difficulty (2), Little difficulty (3), No difficulty (4) Survey date:  01/06/24 02/22/24 03/29/24  Any of your usual work, housework or school activities 0 3 3  2. Usual hobbies, recreational or sporting activities 0 0 0  3. Getting into/out of the bath 3 4 4   4. Walking between rooms 3 3.5 4  5. Putting on socks/shoes 2 4 4   6. Squatting  0 0 0  7. Lifting an object, like a bag of groceries from the floor 0 3 4  8. Performing light activities around your home 0 3.5 4  9. Performing heavy activities around your home 0 1.5 2  10. Getting into/out of a car 2 4 4   11. Walking 2 blocks 0 1 3.5  12. Walking 1 mile 0 0 0  13. Going up/down 10 stairs (1 flight) 0 3.5 4  14. Standing for 1 hour 0  2 1  15.  sitting for 1 hour 0 4 4  16. Running on even ground 0 0 0  17. Running on uneven ground 0 0 0  18. Making sharp turns while running fast 0 0 0  19. Hopping  0 0 0  20. Rolling over in bed 4 4 4   Score total:  14/80 40/80 45.5/80     COGNITION: Overall cognitive status: Within functional limits for tasks assessed      PALPATION: TTP:  distal HS and mid HS.  medial and lateral R knee   LOWER EXTREMITY ROM:   ROM Right eval Left Eval AROM Right 9/2 Right 10/8 Left 10/8  Hip flexion       Hip extension       Hip abduction       Hip adduction       Hip internal rotation       Hip external rotation       Knee flexion 109 PROM 129 121 127   Knee extension 0 deg in resting 7 3 4  Lacking 9 deg  Ankle dorsiflexion       Ankle plantarflexion       Ankle inversion       Ankle eversion        (Blank rows = not tested)  LOWER EXTREMITY MMT:  MMT Right eval Left eval Right 9/2 Right 10/8  Hip flexion      Hip extension      Hip abduction   5/5 5/5  Hip adduction      Hip internal rotation      Hip external rotation      Knee flexion   Pt able to perform prone knee flexion AROM without pain.  Pt tolerated minimal resistance well without pain. 4+/5  Knee extension      Ankle dorsiflexion      Ankle plantarflexion      Ankle inversion      Ankle eversion       (Blank rows = not tested)   GAIT: Comments: Pt ambulated without crutch.  Pt had good stability.  She denies any pain, but reports tightness.  Pt is not limping, though slightly favors it.  Pt has decrease gait speed.                                                                                                                                  TREATMENT:   10/21 Reviewed pt presentation, pain levels, and response to prior treatment. Pt ambulated 2 laps in the clinic without crutch.   Prone knee flexion AROM with eccentric focus 2x10 Bridges 2x10 S/L hip abd 2x10 Sidestepping at rail x 3  laps Pt received R knee extension PROM in supine   10/8 Reviewed pt presentation, current function, and pain levels.  Assessed strength, gait, and tenderness to palpation.  Reviewed goals and educated pt in progress. PT educated pt concerning POC.   Pt completed LEFS.    03/21/24 Sci-fit bike L5 x4min  Gait without crutch Hip hinges x10 Deadlifts 10lb KB 2x10 Thomas stretch 3x30sec bil Prone HSC 5# 2x10bil Cable walks 25lbs fwd and back x5ea Sit to stands 2x10  Side lunges (partial)  (some knee pain so stopped)  03/14/24: Sci-fit bike L5 x94min  Gait without crutch Standing hip swings fwd/back x15ea Hip hinges x7 Standing HSC unresisted Thomas stretch 3x20sec bil Prone HSC 5# 2x15bil LAQ 5#  3 hold x20ea Cable walks 20-25lbs fwd and back x5ea Sit to stands 2x10   03/07/24 Scifit bike L3x5 minutes Pt ambulated 300 ft without crutch  bridges x10 Staggered bridges 2x10  Prone HS curls R 7.5# 2x12 Sidestepping x 10 reps and with RTB around LE's above knees 2x10 Braiding 3x10 Tandem stance 2x30 sec R LE back SLS with UE support 2x10 sec  CP to post knee and distal HS in supine x 10 mins (unbilled time)  02/25/24  Scifit bike L5x5 minutes Gait training without crutch 2X 240 feet  Shuttle BLE press 87# 2x15ea Shuttle LE press single leg 50# 2x15ea Walking bridges 2x10 Staggered bridges 2x10 B Prone HS curls R 7.5# 2x12 Thomas stretch 2x 20sec Hip flexor stretch at stair 30 seconds x 2 each Hamstring stretch at stair 30 seconds x 2 each  02/22/24: Scift bike x 5 mins Bridges x 10 Walking bridges x 10 Prone eccentric HS with manual resistance 2x5 Sidestepping x 3 laps at rail Braiding x 3 laps at rail  PT assessed gait, palpation, ROM, and strength.  See above. Pt completed LEFS.  See above   PATIENT EDUCATION:  Education details: relevant anatomy, rationale of interventions, POC, dx, objective findings, progress, prognosis, gait, crutch usage, and HEP.   PT answered questions. Person educated: Patient Education method:  Explanation, Demonstration Education comprehension: verbalized understanding, returned demonstration  HOME EXERCISE PROGRAM:   ASSESSMENT:  CLINICAL IMPRESSION:  She is 12 weeks and 3 days s/p injury.  Pt is progressing with strength, tolerance to activity, pain, and mobility.  Pt denies pain currently and reports 1/10 worst pain.  Pt is using 1 crutch outside of home and work.  Pt tolerated much increased ambulation distance this past weekend well.  She ambulated at a car show and the farmer's market without her crutch.  Pt reports no pain afterwards, just tightness in R calf.  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  Pt demonstrates improved HS strength to 4+/5 though continues to have limited knee extension AROM.  Pt continues to have tenderness in HS.  Pt does slightly favor R LE though has no limp with gait.  Pt demonstrates improved self perceived disability as evidenced by LEFS score though not clinically significant.  Pt is having surgery tomorrow relating to CA and may be put on hold until clearance from surgeon.  There has not been much change to goals since last PN, though pt did meet LTG #4.   stating that pt may resume PT as soon as 03/31/24 with the restriction of no heavy lifting over 15 lbs until 04/27/24.  Pt had R hip pain on the 2nd lap.    OBJECTIVE IMPAIRMENTS: Abnormal gait, decreased activity tolerance, decreased endurance, decreased mobility, difficulty walking, decreased ROM, decreased strength, hypomobility, increased fascial restrictions, impaired flexibility, and pain.   ACTIVITY LIMITATIONS: carrying, bending, standing, squatting, stairs, transfers, and locomotion level, dressing, bathing  PARTICIPATION LIMITATIONS: meal prep, cleaning, laundry, driving, shopping, and community activity  PERSONAL FACTORS: 1-2 comorbidities: bilat knee OA, Hx of SI  pain are also affecting patient's functional outcome.   REHAB POTENTIAL: Good  CLINICAL DECISION MAKING: Stable/uncomplicated  EVALUATION COMPLEXITY: Low   GOALS  SHORT TERM GOALS:   Pt will be independent and compliant with HEP for improved pain, strength, ROM, and function.  Baseline: Goal status: MET 8/12 Target date:  01/27/2024  2.  Pt will wean out of KI and off crutches without adverse  effects.   Baseline:  Goal status: PARTIALLY MET  9/2--still progressing  10/8 Target date:  01/27/2024  3.  Pt will report she is able to ambulate community distance without any feelings of instability.  Baseline:  Goal status:  PARTIALLY MET  9/2; no feelings of instability, but using crutch Target date: 02/03/2024  4.  Pt will ambulate with a normalized heel to toe gait pattern without limping.  Baseline:  Goal status:  PROGRESSING  9/2 Target date:  02/10/2024  5.  Pt will be able to perform her self care activities and shower transfers without increased pain and difficulty.  Baseline:  Goal status: GOAL MET   9/2 Target date:  02/17/2024  6.  Pt will be able to tolerate exercises without adverse effects for improved strength and proprioception to assist with returning to PLOF.  Baseline:  Goal status: GOAL MET  9/2 Target date:  02/10/2024  LONG TERM GOALS: Target date: 06/07/2024  Pt will be able to perform stairs with a reciprocal gait with good control without difficulty.  Baseline:  Goal status:  not assessed  2.  Pt will be able to perform her household chores without significant pain and difficulty.  Baseline:  Goal status: GOAL MET  9/2  3.  Pt will ambulate extended community distance without significant pain and difficulty.  Baseline:  Goal status:  PROGRESSING  4.  Pt will demo at least a 4 to 4+/5 MMT strength in R HS for improved performance of functional mobility and tolerance of daily mobility.  Baseline:  Goal status:  GOAL MET  10/8     PLAN:  PT  FREQUENCY: 1-2x/wk   PT DURATION: 10 weeks  PLANNED INTERVENTIONS: 02835- PT Re-evaluation, 97750- Physical Performance Testing, 97110-Therapeutic exercises, 97530- Therapeutic activity, [id number]- Neuromuscular re-education, 97535- Self Care, 02859- Manual therapy, [phone]- Gait training, [phone]- Aquatic Therapy, [phone]- Electrical stimulation (unattended), [phone]- Electrical stimulation (manual), [id number]- Ultrasound, Patient/Family education, Balance training, Stair training, Taping, Joint mobilization, Spinal mobilization, DME instructions, Cryotherapy, and Moist heat  PLAN FOR NEXT SESSION:  Pt will discuss with MD about when it is appropriate to return to PT and restrictions.  PT informed pt she needs clearance from surgeon to return to PT.  Pt may be on hold due to surgery/dx and will resume PT when she has clearance.  Leigh Minerva III PT, DPT 04/12/24 5:52 PM

## 2024-04-12 ENCOUNTER — Encounter (HOSPITAL_BASED_OUTPATIENT_CLINIC_OR_DEPARTMENT_OTHER): Payer: Self-pay | Admitting: Physical Therapy

## 2024-04-18 ENCOUNTER — Ambulatory Visit (HOSPITAL_BASED_OUTPATIENT_CLINIC_OR_DEPARTMENT_OTHER): Payer: Self-pay | Admitting: Physical Therapy

## 2024-04-21 ENCOUNTER — Ambulatory Visit (HOSPITAL_BASED_OUTPATIENT_CLINIC_OR_DEPARTMENT_OTHER): Admitting: Physical Therapy

## 2024-04-21 ENCOUNTER — Other Ambulatory Visit (HOSPITAL_BASED_OUTPATIENT_CLINIC_OR_DEPARTMENT_OTHER): Payer: Self-pay

## 2024-04-21 ENCOUNTER — Encounter (HOSPITAL_BASED_OUTPATIENT_CLINIC_OR_DEPARTMENT_OTHER): Payer: Self-pay | Admitting: Physical Therapy

## 2024-04-21 DIAGNOSIS — M25661 Stiffness of right knee, not elsewhere classified: Secondary | ICD-10-CM | POA: Diagnosis not present

## 2024-04-21 DIAGNOSIS — M6281 Muscle weakness (generalized): Secondary | ICD-10-CM | POA: Diagnosis not present

## 2024-04-21 DIAGNOSIS — R262 Difficulty in walking, not elsewhere classified: Secondary | ICD-10-CM

## 2024-04-21 DIAGNOSIS — M79651 Pain in right thigh: Secondary | ICD-10-CM

## 2024-04-21 MED ORDER — FLUZONE 0.5 ML IM SUSY
0.5000 mL | PREFILLED_SYRINGE | Freq: Once | INTRAMUSCULAR | 0 refills | Status: AC
  Filled 2024-04-21: qty 0.5, 1d supply, fill #0

## 2024-04-21 NOTE — Therapy (Signed)
 OUTPATIENT PHYSICAL THERAPY LOWER EXTREMITY TREATMENT     Patient Name: Amanda Mack MRN: [id number] DOB:11/12/1965, 58 y.o., female Today's Date: 04/21/2024  END OF SESSION:  PT End of Session - 04/21/24 1030     Visit Number 18    Number of Visits 23    Date for Recertification  06/07/24    Authorization Type BCBS    PT Start Time 1027    PT Stop Time 1107    PT Time Calculation (min) 40 min    Activity Tolerance Patient tolerated treatment well    Behavior During Therapy WFL for tasks assessed/performed                          Past Medical History:  Diagnosis Date   Adopted    Allergy    Arthritis    GERD (gastroesophageal reflux disease)    H/O LEEP 1990   Migraines    Routine general medical examination at a health care facility 03/09/2023   Past Surgical History:  Procedure Laterality Date   CERVICAL CONE BIOPSY     AND d&c   KNEE ARTHROSCOPY Left 2019   LESION DESTRUCTION N/A 03/30/2024   Procedure: DESTRUCTION, LESION, GENITALIA;  Surgeon: Viktoria Comer SAUNDERS, MD;  Location: [facility];  Service: Gynecology;  Laterality: N/A;   SHOULDER SURGERY     VAGINECTOMY, PARTIAL N/A 03/30/2024   Procedure: VAGINECTOMY, PARTIAL;  Surgeon: Viktoria Comer SAUNDERS, MD;  Location: [facility];  Service: Gynecology;  Laterality: N/A;  endocervical curettage   Patient Active Problem List   Diagnosis Date Noted   Vaginal dysplasia 03/30/2024   Routine general medical examination at a health care facility 03/09/2023   Menopausal symptoms 08/13/2022   Hyperlipidemia 09/23/2020   Vitamin D  deficiency, unspecified 03/01/2019   Sinus tachycardia 09/29/2016   Essential hypertension 04/17/2016   Class 3 severe obesity with serious comorbidity and body mass index (BMI) of 40.0 to 44.9 in adult [facility]) 04/17/2016     REFERRING PROVIDER: Bissell, Jaclyn, PA-C  REFERRING DIAG: Pain in R lower limb, M79.604  THERAPY DIAG:  Muscle weakness (generalized)  Pain in  right thigh  Difficulty in walking, not elsewhere classified  Stiffness of right knee, not elsewhere classified  Rationale for Evaluation and Treatment: Rehabilitation  ONSET DATE: 12/17/2023 and 01/02/2024  SUBJECTIVE:   SUBJECTIVE STATEMENT:  Pt states she felt fine after prior treatment and her LE felt a little tired.  Pt has been performing prone knee extension hangs at home and feels better.  That exercise relaxes her knee and she has no pain.  Pt denies HS and knee pain currently.  She has been having medial and lateral knee pain.  She has initial pain when standing up which goes away in 2 steps.  Pt has been using the crutch when outside of home and work.       PERTINENT HISTORY: Bilat knee OA, L knee arthroscopy 2013 Hx of SI pain R Shoulder surgery HTN  PAIN:  NPRS:  0/10  now, 1/10 worst Location:  distal R HS and knee Type:  Achy   PRECAUTIONS: Other: knee OA    WEIGHT BEARING RESTRICTIONS: No  FALLS:  Has patient fallen in last 6 months? 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   PLOF: Independent  PATIENT GOALS: to  remove crutches and walk normally   OBJECTIVE:  Note: Objective measures were completed at Evaluation unless otherwise noted.  DIAGNOSTIC FINDINGS: X rays per PA note:  tricompartmental degenerative changes present without acute displaced fracture or dislocation identified.  When compared to radiographs from Nov 2024, no significant changes identified.  PATIENT SURVEYS:  LEFS  Extreme difficulty/unable (0), Quite a bit of difficulty (1), Moderate difficulty (2), Little difficulty (3), No difficulty (4) Survey date:  01/06/24 02/22/24 03/29/24  Any of your usual work, housework or school activities 0 3 3  2. Usual hobbies, recreational or sporting activities 0 0 0  3. Getting into/out of  the bath 3 4 4   4. Walking between rooms 3 3.5 4  5. Putting on socks/shoes 2 4 4   6. Squatting  0 0 0  7. Lifting an object, like a bag of groceries from the floor 0 3 4  8. Performing light activities around your home 0 3.5 4  9. Performing heavy activities around your home 0 1.5 2  10. Getting into/out of a car 2 4 4   11. Walking 2 blocks 0 1 3.5  12. Walking 1 mile 0 0 0  13. Going up/down 10 stairs (1 flight) 0 3.5 4  14. Standing for 1 hour 0 2 1  15.  sitting for 1 hour 0 4 4  16. Running on even ground 0 0 0  17. Running on uneven ground 0 0 0  18. Making sharp turns while running fast 0 0 0  19. Hopping  0 0 0  20. Rolling over in bed 4 4 4   Score total:  14/80 40/80 45.5/80     COGNITION: Overall cognitive status: Within functional limits for tasks assessed      PALPATION: TTP:  distal HS and mid HS.  medial and lateral R knee   LOWER EXTREMITY ROM:   ROM Right eval Left Eval AROM Right 9/2 Right 10/8 Left 10/8  Hip flexion       Hip extension       Hip abduction       Hip adduction       Hip internal rotation       Hip external rotation       Knee flexion 109 PROM 129 121 127   Knee extension 0 deg in resting 7 3 4  Lacking 9 deg  Ankle dorsiflexion       Ankle plantarflexion       Ankle inversion       Ankle eversion        (Blank rows = not tested)  LOWER EXTREMITY MMT:  MMT Right eval Left eval Right 9/2 Right 10/8  Hip flexion      Hip extension      Hip abduction   5/5 5/5  Hip adduction      Hip internal rotation      Hip external rotation      Knee flexion   Pt able to perform prone knee flexion AROM without pain.  Pt tolerated minimal resistance well without pain. 4+/5  Knee extension      Ankle dorsiflexion      Ankle plantarflexion      Ankle inversion      Ankle eversion       (Blank rows = not tested)   GAIT: Comments: Pt ambulated without crutch.  Pt had good stability.  She denies any pain, but reports tightness.  Pt is  not limping, though  slightly favors it.  Pt has decrease gait speed.                                                                                                                                  TREATMENT:   10/31 Reviewed response to prior treatment, pain level, and pt presentation.  Pt ambulated laps around the clinic without an AD with PT carrying her crutch.  She reports tightness in t/o R LE.  Pt had no instability.  As she walked further, she began to favor R LE some with decreased toe off.  When pt focused on her gait, her quality of gait improved.  Pt had decreased TKE in bilat LE's.  PT educated pt concerning gait.   Pt performed standing weight shifts on airex in staggered stance to improve gait.   Sidestepping at rail without UE support 2 sets of 3 laps Braiding at rail without UE support Supine bridge 2x10 Prone HS curls with eccentric focus 2# x10, 3# x 10   10/21 Reviewed pt presentation, pain levels, and response to prior treatment. Pt ambulated 2 laps in the clinic without crutch.   Prone knee flexion AROM with eccentric focus 2x10 Bridges 2x10 S/L hip abd 2x10 Sidestepping at rail x 3 laps Pt received R knee extension PROM in supine   10/8 Reviewed pt presentation, current function, and pain levels.  Assessed strength, gait, and tenderness to palpation.  Reviewed goals and educated pt in progress. PT educated pt concerning POC.   Pt completed LEFS.   PATIENT EDUCATION:  Education details: relevant anatomy, rationale of interventions, POC, dx, prognosis, gait, crutch usage, and HEP.  PT answered questions. Person educated: Patient Education method:  Explanation, Demonstration Education comprehension: verbalized understanding, returned demonstration  HOME EXERCISE PROGRAM:   ASSESSMENT:  CLINICAL IMPRESSION:  Pt is 3 weeks and 1 day s/p partial simple vaginectomy, endocervical curettage.  Pt denies any bleeding.  Pt continues to use her crutch  outside of her home and work.  She reports having tightness in LE with ambulation.  PT had pt ambulating the hallways around the clinic without AD for multiple laps.  She had no instability and no increased pain.  She does report tightness in R LE.  Pt has decreased TKE in bilat knees with gait.  She began to favor R LE with gait which she corrected with cuing from PT and increased focus.  PT answered pt's questions and educated pt in gait observation and pattern.  PT educated pt in proper weaning of crutch.  PT instructed pt in increasing ambulation distance without crutch though needs crutch for longer distance.  PT instructed pt to use crutch if she has pain, fatigue, instability, or begins limping.  Pt performed exercises well without c/o's or pain.  She responded well to treatment having no pain after treatment.  She should benefit from cont skilled PT to address goals and improve ambulation, strength, and  function.    OBJECTIVE IMPAIRMENTS: Abnormal gait, decreased activity tolerance, decreased endurance, decreased mobility, difficulty walking, decreased ROM, decreased strength, hypomobility, increased fascial restrictions, impaired flexibility, and pain.   ACTIVITY LIMITATIONS: carrying, bending, standing, squatting, stairs, transfers, and locomotion level, dressing, bathing  PARTICIPATION LIMITATIONS: meal prep, cleaning, laundry, driving, shopping, and community activity  PERSONAL FACTORS: 1-2 comorbidities: bilat knee OA, Hx of SI pain are also affecting patient's functional outcome.   REHAB POTENTIAL: Good  CLINICAL DECISION MAKING: Stable/uncomplicated  EVALUATION COMPLEXITY: Low   GOALS  SHORT TERM GOALS:   Pt will be independent and compliant with HEP for improved pain, strength, ROM, and function.  Baseline: Goal status: MET 8/12 Target date:  01/27/2024  2.  Pt will wean out of KI and off crutches without adverse effects.   Baseline:  Goal status: PARTIALLY MET  9/2--still  progressing  10/8 Target date:  01/27/2024  3.  Pt will report she is able to ambulate community distance without any feelings of instability.  Baseline:  Goal status:  PARTIALLY MET  9/2; no feelings of instability, but using crutch Target date: 02/03/2024  4.  Pt will ambulate with a normalized heel to toe gait pattern without limping.  Baseline:  Goal status:  PROGRESSING  9/2 Target date:  02/10/2024  5.  Pt will be able to perform her self care activities and shower transfers without increased pain and difficulty.  Baseline:  Goal status: GOAL MET   9/2 Target date:  02/17/2024  6.  Pt will be able to tolerate exercises without adverse effects for improved strength and proprioception to assist with returning to PLOF.  Baseline:  Goal status: GOAL MET  9/2 Target date:  02/10/2024  LONG TERM GOALS: Target date: 06/07/2024  Pt will be able to perform stairs with a reciprocal gait with good control without difficulty.  Baseline:  Goal status:  not assessed  2.  Pt will be able to perform her household chores without significant pain and difficulty.  Baseline:  Goal status: GOAL MET  9/2  3.  Pt will ambulate extended community distance without significant pain and difficulty.  Baseline:  Goal status:  PROGRESSING  4.  Pt will demo at least a 4 to 4+/5 MMT strength in R HS for improved performance of functional mobility and tolerance of daily mobility.  Baseline:  Goal status:  GOAL MET  10/8     PLAN:  PT FREQUENCY: 1-2x/wk   PT DURATION: 10 weeks  PLANNED INTERVENTIONS: 97164- PT Re-evaluation, 97750- Physical Performance Testing, 97110-Therapeutic exercises, 97530- Therapeutic activity, [id number]- Neuromuscular re-education, 97535- Self Care, [id number]- Manual therapy, [phone]- Gait training, [phone]- Aquatic Therapy, [phone]- Electrical stimulation (unattended), [phone]- Electrical stimulation (manual), [id number]- Ultrasound, Patient/Family education, Balance training, Stair training,  Taping, Joint mobilization, Spinal mobilization, DME instructions, Cryotherapy, and Moist heat  PLAN FOR NEXT SESSION:  Cont with ther-ex, gait, and neuro re-ed.  Leigh Minerva III PT, DPT 04/21/24 4:44 PM

## 2024-04-24 ENCOUNTER — Encounter (HOSPITAL_BASED_OUTPATIENT_CLINIC_OR_DEPARTMENT_OTHER): Payer: Self-pay

## 2024-04-26 ENCOUNTER — Ambulatory Visit (INDEPENDENT_AMBULATORY_CARE_PROVIDER_SITE_OTHER): Admitting: Family Medicine

## 2024-04-26 ENCOUNTER — Encounter: Payer: Self-pay | Admitting: Family Medicine

## 2024-04-26 VITALS — BP 130/80 | HR 76 | Temp 97.9°F | Resp 16 | Ht 65.0 in | Wt 251.2 lb

## 2024-04-26 DIAGNOSIS — E559 Vitamin D deficiency, unspecified: Secondary | ICD-10-CM

## 2024-04-26 DIAGNOSIS — Z13228 Encounter for screening for other metabolic disorders: Secondary | ICD-10-CM | POA: Diagnosis not present

## 2024-04-26 DIAGNOSIS — E785 Hyperlipidemia, unspecified: Secondary | ICD-10-CM | POA: Diagnosis not present

## 2024-04-26 DIAGNOSIS — Z130 Encounter for screening for diseases of the blood and blood-forming organs and certain disorders involving the immune mechanism: Secondary | ICD-10-CM

## 2024-04-26 DIAGNOSIS — I10 Essential (primary) hypertension: Secondary | ICD-10-CM

## 2024-04-26 DIAGNOSIS — Z1329 Encounter for screening for other suspected endocrine disorder: Secondary | ICD-10-CM | POA: Diagnosis not present

## 2024-04-26 DIAGNOSIS — Z0000 Encounter for general adult medical examination without abnormal findings: Secondary | ICD-10-CM

## 2024-04-26 LAB — HEMOGLOBIN A1C: Hgb A1c MFr Bld: 5.4 % (ref 4.6–6.5)

## 2024-04-26 LAB — HEPATIC FUNCTION PANEL
ALT: 19 U/L (ref 0–35)
AST: 20 U/L (ref 0–37)
Albumin: 4.2 g/dL (ref 3.5–5.2)
Alkaline Phosphatase: 57 U/L (ref 39–117)
Bilirubin, Direct: 0.1 mg/dL (ref 0.0–0.3)
Total Bilirubin: 0.5 mg/dL (ref 0.2–1.2)
Total Protein: 6.6 g/dL (ref 6.0–8.3)

## 2024-04-26 LAB — LIPID PANEL
Cholesterol: 188 mg/dL (ref 0–200)
HDL: 43.9 mg/dL (ref >39.00)
LDL Cholesterol: 123 mg/dL — ABNORMAL HIGH (ref 0–99)
NonHDL: 144.29
Total CHOL/HDL Ratio: 4
Triglycerides: 106 mg/dL (ref 0.0–149.0)
VLDL: 21.2 mg/dL (ref 0.0–40.0)

## 2024-04-26 NOTE — Assessment & Plan Note (Addendum)
 BP adequately controlled. Continue metoprolol  succinate 50 mg daily and low salt diet. Recommend monitoring BP regularly. Continue annual follow-ups as well as home BP is stable.

## 2024-04-26 NOTE — Assessment & Plan Note (Signed)
Continue current dose of vit D supplementation. Further recommendations according to 25 OH vit D result. 

## 2024-04-26 NOTE — Assessment & Plan Note (Signed)
 We discussed the importance of regular physical activity and healthy diet for prevention of chronic illness and/or complications. Preventive guidelines reviewed. Vaccination:  Prefers to hold on Prevnar 20 vaccine. Ca++ and vit D supplementation recommended. Continue her female preventive care with her gynecologist. Next CPE in a year.

## 2024-04-26 NOTE — Patient Instructions (Addendum)
 A few things to remember from today's visit:  Routine general medical examination at a health care facility  Screening for endocrine, metabolic and immunity disorder - Plan: Hemoglobin A1c  Essential hypertension - Plan: Hepatic function panel  Hyperlipidemia, unspecified hyperlipidemia type - Plan: Hepatic function panel, Lipid panel  Vitamin D  deficiency, unspecified - Plan: VITAMIN D  25 Hydroxy (Vit-D Deficiency, Fractures)  If you need refills for medications you take chronically, please call your pharmacy. Do not use My Chart to request refills or for acute issues that need immediate attention. If you send a my chart message, it may take a few days to be addressed, specially if I am not in the office.  Please be sure medication list is accurate. If a new problem present, please set up appointment sooner than planned today.  Health Maintenance, Female Adopting a healthy lifestyle and getting preventive care are important in promoting health and wellness. Ask your health care provider about: The right schedule for you to have regular tests and exams. Things you can do on your own to prevent diseases and keep yourself healthy. What should I know about diet, weight, and exercise? Eat a healthy diet  Eat a diet that includes plenty of vegetables, fruits, low-fat dairy products, and lean protein. Do not eat a lot of foods that are high in solid fats, added sugars, or sodium. Maintain a healthy weight Body mass index (BMI) is used to identify weight problems. It estimates body fat based on height and weight. Your health care provider can help determine your BMI and help you achieve or maintain a healthy weight. Get regular exercise Get regular exercise. This is one of the most important things you can do for your health. Most adults should: Exercise for at least 150 minutes each week. The exercise should increase your heart rate and make you sweat (moderate-intensity exercise). Do  strengthening exercises at least twice a week. This is in addition to the moderate-intensity exercise. Spend less time sitting. Even light physical activity can be beneficial. Watch cholesterol and blood lipids Have your blood tested for lipids and cholesterol at 56 years of age, then have this test every 5 years. Have your cholesterol levels checked more often if: Your lipid or cholesterol levels are high. You are older than 56 years of age. You are at high risk for heart disease. What should I know about cancer screening? Depending on your health history and family history, you may need to have cancer screening at various ages. This may include screening for: Breast cancer. Cervical cancer. Colorectal cancer. Skin cancer. Lung cancer. What should I know about heart disease, diabetes, and high blood pressure? Blood pressure and heart disease High blood pressure causes heart disease and increases the risk of stroke. This is more likely to develop in people who have high blood pressure readings or are overweight. Have your blood pressure checked: Every 3-5 years if you are 36-37 years of age. Every year if you are 15 years old or older. Diabetes Have regular diabetes screenings. This checks your fasting blood sugar level. Have the screening done: Once every three years after age 68 if you are at a normal weight and have a low risk for diabetes. More often and at a younger age if you are overweight or have a high risk for diabetes. What should I know about preventing infection? Hepatitis B If you have a higher risk for hepatitis B, you should be screened for this virus. Talk with your health care  provider to find out if you are at risk for hepatitis B infection. Hepatitis C Testing is recommended for: Everyone born from [birth year] through 1963. Anyone with known risk factors for hepatitis C. Sexually transmitted infections (STIs) Get screened for STIs, including gonorrhea and chlamydia,  if: You are sexually active and are younger than 56 years of age. You are older than 56 years of age and your health care provider tells you that you are at risk for this type of infection. Your sexual activity has changed since you were last screened, and you are at increased risk for chlamydia or gonorrhea. Ask your health care provider if you are at risk. Ask your health care provider about whether you are at high risk for HIV. Your health care provider may recommend a prescription medicine to help prevent HIV infection. If you choose to take medicine to prevent HIV, you should first get tested for HIV. You should then be tested every 3 months for as long as you are taking the medicine. Pregnancy If you are about to stop having your period (premenopausal) and you may become pregnant, seek counseling before you get pregnant. Take 400 to 800 micrograms (mcg) of folic acid every day if you become pregnant. Ask for birth control (contraception) if you want to prevent pregnancy. Osteoporosis and menopause Osteoporosis is a disease in which the bones lose minerals and strength with aging. This can result in bone fractures. If you are 27 years old or older, or if you are at risk for osteoporosis and fractures, ask your health care provider if you should: Be screened for bone loss. Take a calcium or vitamin D  supplement to lower your risk of fractures. Be given hormone replacement therapy (HRT) to treat symptoms of menopause. Follow these instructions at home: Alcohol use Do not drink alcohol if: Your health care provider tells you not to drink. You are pregnant, may be pregnant, or are planning to become pregnant. If you drink alcohol: Limit how much you have to: 0-1 drink a day. Know how much alcohol is in your drink. In the [place] one drink equals one 12 oz bottle of beer (355 mL), one 5 oz glass of wine (148 mL), or one 1 oz glass of hard liquor (44 mL). Lifestyle Do not use any products that  contain nicotine or tobacco. These products include cigarettes, chewing tobacco, and vaping devices, such as e-cigarettes. If you need help quitting, ask your health care provider. Do not use street drugs. Do not share needles. Ask your health care provider for help if you need support or information about quitting drugs. General instructions Schedule regular health, dental, and eye exams. Stay current with your vaccines. Tell your health care provider if: You often feel depressed. You have ever been abused or do not feel safe at home. Summary Adopting a healthy lifestyle and getting preventive care are important in promoting health and wellness. Follow your health care provider's instructions about healthy diet, exercising, and getting tested or screened for diseases. Follow your health care provider's instructions on monitoring your cholesterol and blood pressure. This information is not intended to replace advice given to you by your health care provider. Make sure you discuss any questions you have with your health care provider. Document Revised: 10/28/2020 Document Reviewed: 10/28/2020 Elsevier Patient Education  2024 [employer].

## 2024-04-26 NOTE — Progress Notes (Unsigned)
 Chief Complaint  Patient presents with   Annual Exam   Discussed the use of AI scribe software for clinical note transcription with the patient, who gave verbal consent to proceed.  History of Present Illness Willette Mudry is a 58 year old femalewith PMHx significant for HTN, HLD, and vitamin D  deficiency, who is here today for her routine physical. Last CPE on 03/08/24.  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  She is scheduled for a follow-up next week. She has no pain post-surgery and only experienced some discharge.  She also experienced a torn hamstring over the summer, which has impacted her physical activity. She recently got off crutches last Friday.  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 She has not been able to submerge in water until next Thursday.  Her diet includes a mix of home-cooked meals and eating out, with a preference for salads. She eats vegetables daily and does not snack. She averages six to seven hours of sleep per night and does not consume alcohol.  Immunization History  Administered Date(s) Administered   Influenza, Seasonal, Injecte, Preservative Fre 04/16/2023, 04/21/2024   Influenza,inj,Quad PF,6+ Mos 04/18/2021, 04/11/2022   Influenza-Unspecified 04/04/2021   PFIZER(Purple Top)SARS-COV-2 Vaccination 03/29/2020, 04/19/2020   Td 01/01/2015   Zoster Recombinant(Shingrix ) 01/28/2022, 08/28/2022   Health Maintenance  Topic Date Due   Hepatitis B Vaccines 19-59 Average Risk (1 of 3 - 19+ 3-dose series) Never done   Pneumococcal Vaccine: 50+ Years (1 of 1 - PCV) 02/08/2025 (Originally 02/13/2018)   HIV Screening  03/08/2028 (Originally 02/14/1981)   DTaP/Tdap/Td (2 - Tdap) 12/31/2024   Mammogram  01/13/2026   Colonoscopy  12/17/2026   Cervical Cancer Screening (HPV/Pap Cotest)  02/08/2029   Influenza Vaccine   Completed   Hepatitis C Screening  Completed   Zoster Vaccines- Shingrix   Completed   HPV VACCINES  Aged Out   Meningococcal B Vaccine  Aged Out   COVID-19 Vaccine  Discontinued   Hypertension on Metoprolol  succinate 50mg /day. She discontinued Losartan  25 mg  in 09/2023, it was causing upper respiratory symptoms and ankle edema. Her blood pressure readings have been in the mid-120s to low 130s over 70s, with today's reading at 130/80 mmHg. Negative fro unusual headache, CP, dyspnea,orthopnea,or PND.  Lab Results  Component Value Date   CREATININE 0.82 03/30/2024   BUN 13 03/30/2024   NA 144 03/30/2024   K 4.1 03/30/2024   CL 109 03/30/2024   CO2 25 03/30/2024   Hyperlipidemia:On non pharmacologic treatment.  Lab Results  Component Value Date   CHOL 179 03/04/2023   HDL 40.80 03/04/2023   LDLCALC 115 (H) 03/04/2023   TRIG 116.0 03/04/2023   CHOLHDL 4 03/04/2023   Vitamin D  Deficiency:  Taking supplement daily at night, 5000 U daily or every other day.  Lab Results  Component Value Date   VD25OH 28.63 (L) 03/04/2023   She takes sumatriptan  100 mg daily prn for migraines and meloxicam  15 mg daily prn as needed for knee pain.   Review of Systems  Constitutional:  Negative for appetite change and fever.  HENT:  Negative for mouth sores, sore throat and trouble swallowing.   Eyes:  Negative for redness and visual disturbance.  Respiratory:  Negative for cough, shortness of breath and wheezing.   Cardiovascular:  Negative for chest pain and leg swelling.  Gastrointestinal:  Negative  for abdominal pain, nausea and vomiting.  Endocrine: Negative for cold intolerance, heat intolerance, polydipsia, polyphagia and polyuria.  Genitourinary:  Negative for decreased urine volume, dysuria and hematuria.  Musculoskeletal:  Positive for arthralgias. Negative for gait problem and myalgias.  Skin:  Negative for color change and rash.  Allergic/Immunologic: Positive for environmental  allergies.  Neurological:  Negative for syncope, weakness and headaches.  Hematological:  Negative for adenopathy. Does not bruise/bleed easily.  Psychiatric/Behavioral:  Negative for confusion. The patient is not nervous/anxious.   All other systems reviewed and are negative.  Current Outpatient Medications on File Prior to Visit  Medication Sig Dispense Refill   estradiol  (ESTRACE ) 0.5 MG tablet Take 1 tablet (0.5 mg total) by mouth daily. 90 tablet 3   meloxicam  (MOBIC ) 15 MG tablet Take 1 tablet (15 mg total) by mouth daily with a meal (Patient taking differently: Take 15 mg by mouth daily as needed for pain.) 30 tablet 2   metoprolol  succinate (TOPROL -XL) 50 MG 24 hr tablet Take 50 mg by mouth daily. Take with or immediately following a meal.     omeprazole  (PRILOSEC) 20 MG capsule Take 1 capsule (20 mg total) by mouth as needed. 30 capsule 3   senna-docusate (SENOKOT-S) 8.6-50 MG tablet Take 2 tablets by mouth at bedtime. For AFTER surgery, do not take if having diarrhea 30 tablet 0   SUMAtriptan  (IMITREX ) 100 MG tablet Take 1 tablet (100 mg total) by mouth once as needed for up to 1 dose for migraine. May repeat in 2 hours if headache persists or recurs. 9 tablet 11   progesterone  (PROMETRIUM ) 100 MG capsule Take 1 capsule by mouth nightly on days 1-14 of each month. 42 capsule 3   No current facility-administered medications on file prior to visit.   Past Medical History:  Diagnosis Date   Adopted    Allergy    Arthritis    GERD (gastroesophageal reflux disease)    H/O LEEP 1990   Migraines    Routine general medical examination at a health care facility 03/09/2023    Past Surgical History:  Procedure Laterality Date   CERVICAL CONE BIOPSY     AND d&c   KNEE ARTHROSCOPY Left 2019   LESION DESTRUCTION N/A 03/30/2024   Procedure: DESTRUCTION, LESION, GENITALIA;  Surgeon: Viktoria Comer SAUNDERS, MD;  Location: [facility];  Service: Gynecology;  Laterality: N/A;   SHOULDER SURGERY      VAGINECTOMY, PARTIAL N/A 03/30/2024   Procedure: VAGINECTOMY, PARTIAL;  Surgeon: Viktoria Comer SAUNDERS, MD;  Location: [facility];  Service: Gynecology;  Laterality: N/A;  endocervical curettage    Allergies  Allergen Reactions   Nickel Rash   Strawberry Extract Anaphylaxis and Rash    Family History  Adopted: Yes    Social History   Socioeconomic History   Marital status: Widowed    Spouse name: Not on file   Number of children: Not on file   Years of education: Not on file   Highest education level: Bachelor's degree (e.g., BA, AB, BS)  Occupational History   Occupation: BOA    Employer: [employer]  Tobacco Use   Smoking status: Never    Passive exposure: Never   Smokeless tobacco: Never  Vaping Use   Vaping status: Never Used  Substance and Sexual Activity   Alcohol use: No   Drug use: No   Sexual activity: Not Currently    Birth control/protection: None    Comment: postmenopausal  Other Topics Concern  Not on file  Social History Narrative   Not on file   Social Drivers of Health   Financial Resource Strain: Low Risk  (03/05/2023)   Overall Financial Resource Strain (CARDIA)    Difficulty of Paying Living Expenses: Not hard at all  Food Insecurity: No Food Insecurity (03/23/2024)   Hunger Vital Sign    Worried About Running Out of Food in the Last Year: Never true    Ran Out of Food in the Last Year: Never true  Transportation Needs: No Transportation Needs (03/23/2024)   PRAPARE - Administrator, Civil Service (Medical): No    Lack of Transportation (Non-Medical): No  Physical Activity: Sufficiently Active (03/05/2023)   Exercise Vital Sign    Days of Exercise per Week: 3 days    Minutes of Exercise per Session: 60 min  Stress: Stress Concern Present (03/05/2023)   [employer] of Occupational Health - Occupational Stress Questionnaire    Feeling of Stress : To some extent  Social Connections: Moderately Integrated (03/05/2023)   Social  Connection and Isolation Panel    Frequency of Communication with Friends and Family: More than three times a week    Frequency of Social Gatherings with Friends and Family: Three times a week    Attends Religious Services: More than 4 times per year    Active Member of Clubs or Organizations: Yes    Attends Banker Meetings: More than 4 times per year    Marital Status: Widowed   Today's Vitals   04/26/24 0922  BP: 130/80  Pulse: 76  Resp: 16  Temp: 97.9 F (36.6 C)  SpO2: 98%  Weight: 251 lb 3.2 oz (113.9 kg)  Height: 5' 5 (1.651 m)   Body mass index is 41.8 kg/m.  Wt Readings from Last 3 Encounters:  04/26/24 251 lb 3.2 oz (113.9 kg)  03/30/24 240 lb (108.9 kg)  03/29/24 240 lb (108.9 kg)   Physical Exam Vitals and nursing note reviewed.  Constitutional:      General: She is not in acute distress.    Appearance: She is well-developed.  HENT:     Head: Normocephalic and atraumatic.     Right Ear: Tympanic membrane, ear canal and external ear normal.     Left Ear: Tympanic membrane, ear canal and external ear normal.     Mouth/Throat:     Mouth: Mucous membranes are moist.     Pharynx: Oropharynx is clear. Uvula midline.  Eyes:     Extraocular Movements: Extraocular movements intact.     Conjunctiva/sclera: Conjunctivae normal.     Pupils: Pupils are equal, round, and reactive to light.  Neck:     Thyroid : No thyromegaly.     Trachea: No tracheal deviation.  Cardiovascular:     Rate and Rhythm: Normal rate and regular rhythm.     Pulses:          Dorsalis pedis pulses are 2+ on the right side and 2+ on the left side.     Heart sounds: No murmur heard. Pulmonary:     Effort: Pulmonary effort is normal. No respiratory distress.     Breath sounds: Normal breath sounds.  Abdominal:     Palpations: Abdomen is soft. There is no hepatomegaly or mass.     Tenderness: There is no abdominal tenderness.  Genitourinary:    Comments: Deferred to  gyn. Musculoskeletal:     Comments: No major deformity or signs of synovitis appreciated.  Lymphadenopathy:  Cervical: No cervical adenopathy.     Upper Body:     Right upper body: No supraclavicular adenopathy.     Left upper body: No supraclavicular adenopathy.  Skin:    General: Skin is warm.     Findings: No erythema or rash.  Neurological:     General: No focal deficit present.     Mental Status: She is alert and oriented to person, place, and time.     Cranial Nerves: No cranial nerve deficit.     Coordination: Coordination normal.     Gait: Gait normal.     Deep Tendon Reflexes:     Reflex Scores:      Bicep reflexes are 2+ on the right side and 2+ on the left side.      Patellar reflexes are 2+ on the right side and 2+ on the left side. Psychiatric:        Mood and Affect: Mood and affect normal.    ASSESSMENT AND PLAN: Ms. Caron Ode was here today annual physical examination.  Orders Placed This Encounter  Procedures   Hepatic function panel   Lipid panel   VITAMIN D  25 Hydroxy (Vit-D Deficiency, Fractures)   Hemoglobin A1c   Lab Results  Component Value Date   VD25OH 29.59 (L) 04/26/2024   Lab Results  Component Value Date   CHOL 188 04/26/2024   HDL 43.90 04/26/2024   LDLCALC 123 (H) 04/26/2024   TRIG 106.0 04/26/2024   CHOLHDL 4 04/26/2024   Lab Results  Component Value Date   ALT 19 04/26/2024   AST 20 04/26/2024   ALKPHOS 57 04/26/2024   BILITOT 0.5 04/26/2024   Lab Results  Component Value Date   HGBA1C 5.4 04/26/2024   Routine general medical examination at a health care facility Assessment & Plan: We discussed the importance of regular physical activity and healthy diet for prevention of chronic illness and/or complications. Preventive guidelines reviewed. Vaccination:  Prefers to hold on Prevnar 20 vaccine. Ca++ and vit D supplementation recommended. Continue her female preventive care with her gynecologist. Next CPE in a  year.   Screening for endocrine, metabolic and immunity disorder -     Hemoglobin A1c; Future  Essential hypertension Assessment & Plan: BP adequately controlled. Continue metoprolol  succinate 50 mg daily and low salt diet. Recommend monitoring BP regularly. Continue annual follow-ups as well as home BP is stable.  Orders: -     Hepatic function panel; Future  Hyperlipidemia, unspecified hyperlipidemia type Assessment & Plan: Continue nonpharmacologic treatment. Further recommendations according to FLP result.  Orders: -     Hepatic function panel; Future -     Lipid panel; Future  Vitamin D  deficiency, unspecified Assessment & Plan: Continue current dose of vit D supplementation. Further recommendations according to 25 OH vit D result.  Orders: -     VITAMIN D  25 Hydroxy (Vit-D Deficiency, Fractures); Future   Return in 1 year (on 04/26/2025) for CPE, chronic problems.  Enrika Aguado G. Tynasia Mccaul, MD  [facility]. [place] office.

## 2024-04-26 NOTE — Assessment & Plan Note (Signed)
Continue non pharmacologic treatment. °Further recommendations according to FLP result. °

## 2024-04-27 ENCOUNTER — Ambulatory Visit (HOSPITAL_BASED_OUTPATIENT_CLINIC_OR_DEPARTMENT_OTHER): Payer: Self-pay | Attending: Orthopedic Surgery | Admitting: Physical Therapy

## 2024-04-27 ENCOUNTER — Ambulatory Visit: Payer: Self-pay | Admitting: Family Medicine

## 2024-04-27 ENCOUNTER — Encounter (HOSPITAL_BASED_OUTPATIENT_CLINIC_OR_DEPARTMENT_OTHER): Payer: Self-pay | Admitting: Physical Therapy

## 2024-04-27 DIAGNOSIS — M6281 Muscle weakness (generalized): Secondary | ICD-10-CM | POA: Insufficient documentation

## 2024-04-27 DIAGNOSIS — R262 Difficulty in walking, not elsewhere classified: Secondary | ICD-10-CM | POA: Diagnosis not present

## 2024-04-27 DIAGNOSIS — M79651 Pain in right thigh: Secondary | ICD-10-CM | POA: Diagnosis not present

## 2024-04-27 DIAGNOSIS — M25661 Stiffness of right knee, not elsewhere classified: Secondary | ICD-10-CM | POA: Diagnosis not present

## 2024-04-27 LAB — VITAMIN D 25 HYDROXY (VIT D DEFICIENCY, FRACTURES): VITD: 29.59 ng/mL — ABNORMAL LOW (ref 30.00–100.00)

## 2024-04-27 NOTE — Therapy (Signed)
 OUTPATIENT PHYSICAL THERAPY LOWER EXTREMITY TREATMENT     Patient Name: Amanda Mack MRN: [id number] DOB:[birth year], 58 y.o., female Today's Date: 04/28/2024  END OF SESSION:  PT End of Session - 04/27/24 1530     Visit Number 19    Number of Visits 23    Date for Recertification  06/07/24    Authorization Type BCBS    PT Start Time 1500   Pt arrived late to treatment.   PT Stop Time 1533    PT Time Calculation (min) 33 min    Activity Tolerance Patient tolerated treatment well    Behavior During Therapy WFL for tasks assessed/performed                           Past Medical History:  Diagnosis Date   Adopted    Allergy    Arthritis    GERD (gastroesophageal reflux disease)    H/O LEEP 1990   Migraines    Routine general medical examination at a health care facility 03/09/2023   Past Surgical History:  Procedure Laterality Date   CERVICAL CONE BIOPSY     AND d&c   KNEE ARTHROSCOPY Left 2019   LESION DESTRUCTION N/A 03/30/2024   Procedure: DESTRUCTION, LESION, GENITALIA;  Surgeon: Viktoria Comer SAUNDERS, MD;  Location: [facility];  Service: Gynecology;  Laterality: N/A;   SHOULDER SURGERY     VAGINECTOMY, PARTIAL N/A 03/30/2024   Procedure: VAGINECTOMY, PARTIAL;  Surgeon: Viktoria Comer SAUNDERS, MD;  Location: [facility];  Service: Gynecology;  Laterality: N/A;  endocervical curettage   Patient Active Problem List   Diagnosis Date Noted   Vaginal dysplasia 03/30/2024   Routine general medical examination at a health care facility 03/09/2023   Menopausal symptoms 08/13/2022   Hyperlipidemia 09/23/2020   Vitamin D  deficiency, unspecified 03/01/2019   Sinus tachycardia 09/29/2016   Essential hypertension 04/17/2016   Class 3 severe obesity with serious comorbidity and body mass index (BMI) of 40.0 to 44.9 in adult [facility]) 04/17/2016     REFERRING PROVIDER: Bissell, Jaclyn, PA-C  REFERRING DIAG: Pain in R lower limb, M79.604  THERAPY DIAG:  Muscle  weakness (generalized)  Pain in right thigh  Difficulty in walking, not elsewhere classified  Stiffness of right knee, not elsewhere classified  Rationale for Evaluation and Treatment: Rehabilitation  ONSET DATE: 12/17/2023 and 01/02/2024  SUBJECTIVE:   SUBJECTIVE STATEMENT:  Pt is not walking the pace she is used to.  Pt reports having tightness in knee and HS after prior treatment though no increased pain.  Pt is still performing prone knee extension hangs at home and reports improved knee extension ROM bilat.  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  Pt has to focus on knee extension with gait.  She presents to treatment without crutch.  She c/o's of continued tightness.  Pt denies pain currently.     PERTINENT HISTORY: Partial simple vaginectomy, endocervical curettage on 03/30/24. Bilat knee OA, L knee arthroscopy 2013 Hx of SI pain R Shoulder surgery HTN  PAIN:  NPRS:  0/10  now, 1/10 worst Location:  distal R HS and knee Type:  Achy   PRECAUTIONS: Other: knee OA    WEIGHT BEARING RESTRICTIONS: No  FALLS:  Has patient fallen in last 6 months? 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   PLOF: Independent  PATIENT GOALS: to remove crutches and walk normally   OBJECTIVE:  Note: Objective measures were completed at Evaluation unless otherwise noted.  DIAGNOSTIC FINDINGS: X rays per PA note:  tricompartmental degenerative changes present without acute displaced fracture or dislocation identified.  When compared to radiographs from Nov 2024, no significant changes identified.  PATIENT SURVEYS:  LEFS  Extreme difficulty/unable (0), Quite a bit of difficulty (1), Moderate difficulty (2), Little difficulty (3), No difficulty (4) Survey date:  01/06/24 02/22/24  03/29/24  Any of your usual work, housework or school activities 0 3 3  2. Usual hobbies, recreational or sporting activities 0 0 0  3. Getting into/out of the bath 3 4 4   4. Walking between rooms 3 3.5 4  5. Putting on socks/shoes 2 4 4   6. Squatting  0 0 0  7. Lifting an object, like a bag of groceries from the floor 0 3 4  8. Performing light activities around your home 0 3.5 4  9. Performing heavy activities around your home 0 1.5 2  10. Getting into/out of a car 2 4 4   11. Walking 2 blocks 0 1 3.5  12. Walking 1 mile 0 0 0  13. Going up/down 10 stairs (1 flight) 0 3.5 4  14. Standing for 1 hour 0 2 1  15.  sitting for 1 hour 0 4 4  16. Running on even ground 0 0 0  17. Running on uneven ground 0 0 0  18. Making sharp turns while running fast 0 0 0  19. Hopping  0 0 0  20. Rolling over in bed 4 4 4   Score total:  14/80 40/80 45.5/80     COGNITION: Overall cognitive status: Within functional limits for tasks assessed      PALPATION: TTP:  distal HS and mid HS.  medial and lateral R knee   LOWER EXTREMITY ROM:   ROM Right eval Left Eval AROM Right 9/2 Right 10/8 Left 10/8  Hip flexion       Hip extension       Hip abduction       Hip adduction       Hip internal rotation       Hip external rotation       Knee flexion 109 PROM 129 121 127   Knee extension 0 deg in resting 7 3 4  Lacking 9 deg  Ankle dorsiflexion       Ankle plantarflexion       Ankle inversion       Ankle eversion        (Blank rows = not tested)  LOWER EXTREMITY MMT:  MMT Right eval Left eval Right 9/2 Right 10/8  Hip flexion      Hip extension      Hip abduction   5/5 5/5  Hip adduction      Hip internal rotation      Hip external rotation      Knee flexion   Pt able to perform prone knee flexion AROM without pain.  Pt tolerated minimal resistance well without pain. 4+/5  Knee extension      Ankle dorsiflexion      Ankle plantarflexion      Ankle inversion      Ankle eversion        (Blank rows = not tested)   GAIT: Comments: Pt ambulated without crutch.  Pt  had good stability.  She denies any pain, but reports tightness.  Pt is not limping, though slightly favors it.  Pt has decrease gait speed.                                                                                                                                  TREATMENT:   11/6 Pt ambulated 3 laps without an AD.   Standing Weight Shifts f/b on airex in staggered stance Marching on airex with UE support as needed 2x10-15 Heel to toe rocking with UE support as needed  Lateral band walks with RTB above knees x 3 laps Braiding at rail without UE support x4 reps Prone HS curls with eccentric focus 3# x10, 4# x10  10/31 Reviewed response to prior treatment, pain level, and pt presentation.  Pt ambulated laps around the clinic without an AD with PT carrying her crutch.  She reports tightness in t/o R LE.  Pt had no instability.  As she walked further, she began to favor R LE some with decreased toe off.  When pt focused on her gait, her quality of gait improved.  Pt had decreased TKE in bilat LE's.  PT educated pt concerning gait.   Pt performed standing weight shifts on airex in staggered stance to improve gait.   Sidestepping at rail without UE support 2 sets of 3 laps Braiding at rail without UE support Supine bridge 2x10 Prone HS curls with eccentric focus 2# x10, 3# x 10   10/21 Reviewed pt presentation, pain levels, and response to prior treatment. Pt ambulated 2 laps in the clinic without crutch.   Prone knee flexion AROM with eccentric focus 2x10 Bridges 2x10 S/L hip abd 2x10 Sidestepping at rail x 3 laps Pt received R knee extension PROM in supine   10/8 Reviewed pt presentation, current function, and pain levels.  Assessed strength, gait, and tenderness to palpation.  Reviewed goals and educated pt in progress. PT educated pt concerning POC.   Pt completed LEFS.   PATIENT  EDUCATION:  Education details: relevant anatomy, rationale of interventions, POC, dx, prognosis, gait, crutch usage, and HEP.  PT answered questions. Person educated: Patient Education method:  Explanation, Demonstration Education comprehension: verbalized understanding, returned demonstration  HOME EXERCISE PROGRAM:   ASSESSMENT:  CLINICAL IMPRESSION:  Pt presents to treatment without crutch.  Pt is increasing her ambulation without crutch including walking to/from the pool at [facility].  Pt has improved gait and ambulated 3 laps around the clinic well without any instability or increased pain.  PT instructed her to continue to increase ambulation distance without crutch and informed her of when she needs to use the crutch.   Pt demonstrates improved knee extension ROM based on visual observation in long sitting.  PT provided instruction and cuing for correct form with exercises and she performed exercises well without c/o's.  She responded well to treatment having no pain after treatment.  She should benefit from cont skilled PT to address goals and improve ambulation, strength, and function.    OBJECTIVE IMPAIRMENTS: Abnormal gait, decreased activity tolerance, decreased endurance, decreased mobility, difficulty walking, decreased ROM, decreased strength, hypomobility, increased fascial restrictions, impaired flexibility, and pain.   ACTIVITY LIMITATIONS: carrying, bending, standing, squatting, stairs, transfers, and locomotion level, dressing, bathing  PARTICIPATION LIMITATIONS: meal prep, cleaning, laundry, driving, shopping, and community activity  PERSONAL FACTORS: 1-2 comorbidities: bilat knee OA, Hx of SI pain are also affecting patient's functional outcome.   REHAB POTENTIAL: Good  CLINICAL DECISION MAKING: Stable/uncomplicated  EVALUATION COMPLEXITY: Low   GOALS  SHORT TERM GOALS:   Pt will be independent and compliant with HEP for improved pain, strength, ROM, and  function.  Baseline: Goal status: MET 8/12 Target date:  01/27/2024  2.  Pt will wean out of KI and off crutches without adverse effects.   Baseline:  Goal status: PARTIALLY MET  9/2--still progressing  10/8 Target date:  01/27/2024  3.  Pt will report she is able to ambulate community distance without any feelings of instability.  Baseline:  Goal status:  PARTIALLY MET  9/2; no feelings of instability, but using crutch Target date: 02/03/2024  4.  Pt will ambulate with a normalized heel to toe gait pattern without limping.  Baseline:  Goal status:  PROGRESSING  9/2 Target date:  02/10/2024  5.  Pt will be able to perform her self care activities and shower transfers without increased pain and difficulty.  Baseline:  Goal status: GOAL MET   9/2 Target date:  02/17/2024  6.  Pt will be able to tolerate exercises without adverse effects for improved strength and proprioception to assist with returning to PLOF.  Baseline:  Goal status: GOAL MET  9/2 Target date:  02/10/2024  LONG TERM GOALS: Target date: 06/07/2024  Pt will be able to perform stairs with a reciprocal gait with good control without difficulty.  Baseline:  Goal status:  not assessed  2.  Pt will be able to perform her household chores without significant pain and difficulty.  Baseline:  Goal status: GOAL MET  9/2  3.  Pt will ambulate extended community distance without significant pain and difficulty.  Baseline:  Goal status:  PROGRESSING  4.  Pt will demo at least a 4 to 4+/5 MMT strength in R HS for improved performance of functional mobility and tolerance of daily mobility.  Baseline:  Goal status:  GOAL MET  10/8     PLAN:  PT FREQUENCY: 1-2x/wk   PT DURATION: 10 weeks  PLANNED INTERVENTIONS: 97164- PT Re-evaluation, 97750- Physical Performance Testing, 97110-Therapeutic exercises, 97530- Therapeutic activity, [id number]- Neuromuscular re-education, 97535- Self Care, 02859- Manual therapy, [phone]- Gait  training, [phone]- Aquatic Therapy, [phone]- Electrical stimulation (unattended), [phone]- Electrical stimulation (manual), [id number]- Ultrasound, Patient/Family education, Balance training, Stair training, Taping, Joint mobilization, Spinal mobilization, DME instructions, Cryotherapy, and Moist heat  PLAN FOR NEXT SESSION:  Cont with ther-ex, gait, and neuro re-ed.  Leigh Minerva III PT, DPT 04/28/24 1:16 PM

## 2024-05-02 ENCOUNTER — Encounter (HOSPITAL_BASED_OUTPATIENT_CLINIC_OR_DEPARTMENT_OTHER): Admitting: Physical Therapy

## 2024-05-02 ENCOUNTER — Ambulatory Visit (HOSPITAL_BASED_OUTPATIENT_CLINIC_OR_DEPARTMENT_OTHER): Payer: Self-pay | Admitting: Physical Therapy

## 2024-05-02 DIAGNOSIS — M25661 Stiffness of right knee, not elsewhere classified: Secondary | ICD-10-CM

## 2024-05-02 DIAGNOSIS — R262 Difficulty in walking, not elsewhere classified: Secondary | ICD-10-CM

## 2024-05-02 DIAGNOSIS — M79651 Pain in right thigh: Secondary | ICD-10-CM | POA: Diagnosis not present

## 2024-05-02 DIAGNOSIS — M6281 Muscle weakness (generalized): Secondary | ICD-10-CM

## 2024-05-02 NOTE — Therapy (Signed)
 OUTPATIENT PHYSICAL THERAPY LOWER EXTREMITY TREATMENT     Patient Name: Amanda Mack MRN: [id number] DOB:01/20/1966, 58 y.o., female Today's Date: 05/03/2024  END OF SESSION:  PT End of Session - 05/02/24 1323     Visit Number 20    Number of Visits 23    Date for Recertification  06/07/24    Authorization Type BCBS    PT Start Time 1321   Pt arrived late to treatment   PT Stop Time 1405    PT Time Calculation (min) 44 min    Activity Tolerance Patient tolerated treatment well    Behavior During Therapy [facility] for tasks assessed/performed                           Past Medical History:  Diagnosis Date   Adopted    Allergy    Arthritis    GERD (gastroesophageal reflux disease)    H/O LEEP 1990   Migraines    Routine general medical examination at a health care facility 03/09/2023   Past Surgical History:  Procedure Laterality Date   CERVICAL CONE BIOPSY     AND d&c   KNEE ARTHROSCOPY Left 2019   LESION DESTRUCTION N/A 03/30/2024   Procedure: DESTRUCTION, LESION, GENITALIA;  Surgeon: Viktoria Comer SAUNDERS, MD;  Location: [facility];  Service: Gynecology;  Laterality: N/A;   SHOULDER SURGERY     VAGINECTOMY, PARTIAL N/A 03/30/2024   Procedure: VAGINECTOMY, PARTIAL;  Surgeon: Viktoria Comer SAUNDERS, MD;  Location: [facility];  Service: Gynecology;  Laterality: N/A;  endocervical curettage   Patient Active Problem List   Diagnosis Date Noted   Vaginal dysplasia 03/30/2024   Routine general medical examination at a health care facility 03/09/2023   Menopausal symptoms 08/13/2022   Hyperlipidemia 09/23/2020   Vitamin D  deficiency, unspecified 03/01/2019   Sinus tachycardia 09/29/2016   Essential hypertension 04/17/2016   Class 3 severe obesity with serious comorbidity and body mass index (BMI) of 40.0 to 44.9 in adult [facility]) 04/17/2016     REFERRING PROVIDER: Bissell, Jaclyn, PA-C  REFERRING DIAG: Pain in R lower limb, M79.604  THERAPY DIAG:  Muscle  weakness (generalized)  Pain in right thigh  Difficulty in walking, not elsewhere classified  Stiffness of right knee, not elsewhere classified  Rationale for Evaluation and Treatment: Rehabilitation  ONSET DATE: 12/17/2023 and 01/02/2024  SUBJECTIVE:   SUBJECTIVE STATEMENT:  Pt was tired though had no adverse effects after prior treatments.  Pt states she has been doing a lot around her home.  She reports knee soreness today.  Pt is walking more without crutch.  Pt is limited with standing.  Pt denies pain with ambulation and denies pain currently.  She has 1/10 pain with performing sit to stand to ambulation.     PERTINENT HISTORY: Partial simple vaginectomy, endocervical curettage on 03/30/24. Bilat knee OA, L knee arthroscopy 2013 Hx of SI pain R Shoulder surgery HTN  PAIN:  NPRS:  0/10  now, 1/10 worst Location:  distal R HS and knee Type:  feels tight   PRECAUTIONS: Other: knee OA    WEIGHT BEARING RESTRICTIONS: No  FALLS:  Has patient fallen in last 6 months? 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   PLOF: Independent  PATIENT GOALS: to remove crutches and walk normally   OBJECTIVE:  Note: Objective measures were completed at Evaluation unless otherwise noted.  DIAGNOSTIC FINDINGS: X rays per PA note:  tricompartmental degenerative changes present without acute displaced fracture or dislocation identified.  When compared to radiographs from Nov 2024, no significant changes identified.  PATIENT SURVEYS:  LEFS  Extreme difficulty/unable (0), Quite a bit of difficulty (1), Moderate difficulty (2), Little difficulty (3), No difficulty (4) Survey date:  01/06/24 02/22/24 03/29/24  Any of your usual work, housework or school activities 0 3 3  2. Usual hobbies, recreational or sporting activities 0 0 0  3.  Getting into/out of the bath 3 4 4   4. Walking between rooms 3 3.5 4  5. Putting on socks/shoes 2 4 4   6. Squatting  0 0 0  7. Lifting an object, like a bag of groceries from the floor 0 3 4  8. Performing light activities around your home 0 3.5 4  9. Performing heavy activities around your home 0 1.5 2  10. Getting into/out of a car 2 4 4   11. Walking 2 blocks 0 1 3.5  12. Walking 1 mile 0 0 0  13. Going up/down 10 stairs (1 flight) 0 3.5 4  14. Standing for 1 hour 0 2 1  15.  sitting for 1 hour 0 4 4  16. Running on even ground 0 0 0  17. Running on uneven ground 0 0 0  18. Making sharp turns while running fast 0 0 0  19. Hopping  0 0 0  20. Rolling over in bed 4 4 4   Score total:  14/80 40/80 45.5/80     COGNITION: Overall cognitive status: Within functional limits for tasks assessed      PALPATION: TTP:  distal HS and mid HS.  medial and lateral R knee   LOWER EXTREMITY ROM:   ROM Right eval Left Eval AROM Right 9/2 Right 10/8 Left 10/8 Right 11/11  Hip flexion        Hip extension        Hip abduction        Hip adduction        Hip internal rotation        Hip external rotation        Knee flexion 109 PROM 129 121 127  127  Knee extension 0 deg in resting 7 3 4  Lacking 9 deg 4  Ankle dorsiflexion        Ankle plantarflexion        Ankle inversion        Ankle eversion         (Blank rows = not tested)  LOWER EXTREMITY MMT:  MMT Right eval Left eval Right 9/2 Right 10/8  Hip flexion      Hip extension      Hip abduction   5/5 5/5  Hip adduction      Hip internal rotation      Hip external rotation      Knee flexion   Pt able to perform prone knee flexion AROM without pain.  Pt tolerated minimal resistance well without pain. 4+/5  Knee extension      Ankle dorsiflexion      Ankle plantarflexion      Ankle inversion      Ankle eversion       (Blank rows = not tested)   GAIT: Comments: Pt ambulated without crutch.  Pt had good stability.   She denies any pain, but reports tightness.  Pt is not limping, though slightly favors it.  Pt has decrease gait speed.                                                                                                                                  TREATMENT:   11/11 Pt ambulated 3 laps without an AD.  Supine bridge in staggered position 2x12 Prone HS curls with eccentric focus 4# x10, 5#  2x10 Lateral band walks with RTB above knees x 4 laps Braiding at rail without UE support x4 reps Heel to toe rocking with UE support as needed  Marching on airex without UE support 2x10  PT assessed knee ROM.  See above.   11/6 Pt ambulated 3 laps without an AD.   Standing Weight Shifts f/b on airex in staggered stance Marching on airex with UE support as needed 2x10-15 Heel to toe rocking with UE support as needed  Lateral band walks with RTB above knees x 3 laps Braiding at rail without UE support x4 reps Prone HS curls with eccentric focus 3# x10, 4# x10  10/31 Reviewed response to prior treatment, pain level, and pt presentation.  Pt ambulated laps around the clinic without an AD with PT carrying her crutch.  She reports tightness in t/o R LE.  Pt had no instability.  As she walked further, she began to favor R LE some with decreased toe off.  When pt focused on her gait, her quality of gait improved.  Pt had decreased TKE in bilat LE's.  PT educated pt concerning gait.   Pt performed standing weight shifts on airex in staggered stance to improve gait.   Sidestepping at rail without UE support 2 sets of 3 laps Braiding at rail without UE support Supine bridge 2x10 Prone HS curls with eccentric focus 2# x10, 3# x 10   10/21 Reviewed pt presentation, pain levels, and response to prior treatment. Pt ambulated 2 laps in the clinic without crutch.   Prone knee flexion AROM with eccentric focus 2x10 Bridges 2x10 S/L hip abd 2x10 Sidestepping at rail x 3 laps Pt received R knee  extension PROM in supine    PATIENT EDUCATION:  Education details: relevant anatomy, rationale of interventions, POC, dx, prognosis, gait, crutch usage, and HEP.  PT answered questions. Person educated: Patient Education method:  Explanation, Demonstration Education comprehension: verbalized understanding, returned demonstration  HOME EXERCISE PROGRAM:   ASSESSMENT:  CLINICAL IMPRESSION:  Pt presents to treatment without crutch.  Pt is increasing her ambulation without crutch.  Pt ambulated 3 laps around the clinic without an AD.  She began to favor R LE with increased ambulation.  She had tightness in R hip and pain in hip later with ambulation.  Pt would stop for a quick standing rest break and her hip would feel better.  She performed exercises well without c/o's.  PT assessed knee ROM.  She has good flexion ROM and  continues to have limitations in extension.  She responded well to treatment stating she feels good and feels better after treatment.  She should benefit from cont skilled PT to address goals and improve ambulation, strength, and function.      OBJECTIVE IMPAIRMENTS: Abnormal gait, decreased activity tolerance, decreased endurance, decreased mobility, difficulty walking, decreased ROM, decreased strength, hypomobility, increased fascial restrictions, impaired flexibility, and pain.   ACTIVITY LIMITATIONS: carrying, bending, standing, squatting, stairs, transfers, and locomotion level, dressing, bathing  PARTICIPATION LIMITATIONS: meal prep, cleaning, laundry, driving, shopping, and community activity  PERSONAL FACTORS: 1-2 comorbidities: bilat knee OA, Hx of SI pain are also affecting patient's functional outcome.   REHAB POTENTIAL: Good  CLINICAL DECISION MAKING: Stable/uncomplicated  EVALUATION COMPLEXITY: Low   GOALS  SHORT TERM GOALS:   Pt will be independent and compliant with HEP for improved pain, strength, ROM, and function.  Baseline: Goal status:  MET 8/12 Target date:  01/27/2024  2.  Pt will wean out of KI and off crutches without adverse effects.   Baseline:  Goal status: PARTIALLY MET  9/2--still progressing  10/8 Target date:  01/27/2024  3.  Pt will report she is able to ambulate community distance without any feelings of instability.  Baseline:  Goal status:  PARTIALLY MET  9/2; no feelings of instability, but using crutch Target date: 02/03/2024  4.  Pt will ambulate with a normalized heel to toe gait pattern without limping.  Baseline:  Goal status:  PROGRESSING  9/2 Target date:  02/10/2024  5.  Pt will be able to perform her self care activities and shower transfers without increased pain and difficulty.  Baseline:  Goal status: GOAL MET   9/2 Target date:  02/17/2024  6.  Pt will be able to tolerate exercises without adverse effects for improved strength and proprioception to assist with returning to PLOF.  Baseline:  Goal status: GOAL MET  9/2 Target date:  02/10/2024  LONG TERM GOALS: Target date: 06/07/2024  Pt will be able to perform stairs with a reciprocal gait with good control without difficulty.  Baseline:  Goal status:  not assessed  2.  Pt will be able to perform her household chores without significant pain and difficulty.  Baseline:  Goal status: GOAL MET  9/2  3.  Pt will ambulate extended community distance without significant pain and difficulty.  Baseline:  Goal status:  PROGRESSING  4.  Pt will demo at least a 4 to 4+/5 MMT strength in R HS for improved performance of functional mobility and tolerance of daily mobility.  Baseline:  Goal status:  GOAL MET  10/8     PLAN:  PT FREQUENCY: 1-2x/wk   PT DURATION: 10 weeks  PLANNED INTERVENTIONS: 97164- PT Re-evaluation, 97750- Physical Performance Testing, 97110-Therapeutic exercises, 97530- Therapeutic activity, [id number]- Neuromuscular re-education, 97535- Self Care, 02859- Manual therapy, [phone]- Gait training, [phone]- Aquatic Therapy, [phone]-  Electrical stimulation (unattended), [phone]- Electrical stimulation (manual), [id number]- Ultrasound, Patient/Family education, Balance training, Stair training, Taping, Joint mobilization, Spinal mobilization, DME instructions, Cryotherapy, and Moist heat  PLAN FOR NEXT SESSION:  Cont with ther-ex, gait, and neuro re-ed.  Leigh Minerva III PT, DPT 05/03/24 5:28 PM

## 2024-05-03 ENCOUNTER — Encounter (HOSPITAL_BASED_OUTPATIENT_CLINIC_OR_DEPARTMENT_OTHER): Payer: Self-pay | Admitting: Physical Therapy

## 2024-05-04 ENCOUNTER — Encounter: Payer: Self-pay | Admitting: Gynecologic Oncology

## 2024-05-04 ENCOUNTER — Inpatient Hospital Stay: Attending: Gynecologic Oncology | Admitting: Gynecologic Oncology

## 2024-05-04 VITALS — BP 140/78 | HR 84 | Temp 99.1°F | Resp 19 | Wt 254.4 lb

## 2024-05-04 DIAGNOSIS — Z7189 Other specified counseling: Secondary | ICD-10-CM

## 2024-05-04 DIAGNOSIS — B977 Papillomavirus as the cause of diseases classified elsewhere: Secondary | ICD-10-CM

## 2024-05-04 DIAGNOSIS — N893 Dysplasia of vagina, unspecified: Secondary | ICD-10-CM

## 2024-05-04 DIAGNOSIS — Z9079 Acquired absence of other genital organ(s): Secondary | ICD-10-CM

## 2024-05-04 NOTE — Progress Notes (Signed)
 Gynecologic Oncology Return Clinic Visit  05/04/24  Reason for Visit: follow-up  Treatment History: Patient endorses a history of abnormal Pap smear in the age of 58, at which time precancerous versus cancerous cells were seen.  There was some confusion whether this was from the cervix or the uterus.  She remembers being HPV negative at that time.  She underwent colposcopy and ultimately had cervical excision procedure and D&C.  She remembers that there were cancerous cells, does not remember if there was any cancer found.  Denies needing any additional treatment such as radiation or chemotherapy, additional surgeries.  Her follow-up testing was all normal.  She had 1 mildly abnormal Pap smear in the intervening time it was felt to be a lab error as the repeat test came back normal.   Recent cytology history as noted below: 09/2020: Pap NILM, HR HPV negative 01/2024: Pap ASCUS, HPV 16+ 02/28/24: Colposcopy performed with no cervical findings, decreased uptake of Lugols along vaginal wall lateral to the cervix (multifocal, left posterior vagina). ECC attempted but not successful to do cervical stenosis. Vaginal biopsy showed VaIN 2-3  03/30/24: Partial simple vaginectomy, endocervical curettage  Operative findings: On EUA, smooth cervix, small mobile uterus. No palpable vaginal lesions. On speculum exam, area of prior biopsy noted. After application of lugols, several area with decreased uptake all along upper left vaginal apex from 3 - 4 o'clock, the largest measuring 1 cm at site of prior biopsy. No areas of decreased uptake after application of lugols otherwise in the upper vagina after examination of all vaginal walls.   Interval History: Doing well.  Had some bleeding and discharge for period of time after surgery, denies any currently.  Endorses normal bowel and bladder function.  Past Medical/Surgical History: Past Medical History:  Diagnosis Date   Adopted    Allergy    Arthritis    GERD  (gastroesophageal reflux disease)    H/O LEEP 1990   Migraines    Routine general medical examination at a health care facility 03/09/2023    Past Surgical History:  Procedure Laterality Date   CERVICAL CONE BIOPSY     AND d&c   KNEE ARTHROSCOPY Left 2019   LESION DESTRUCTION N/A 03/30/2024   Procedure: DESTRUCTION, LESION, GENITALIA;  Surgeon: Viktoria Comer SAUNDERS, MD;  Location: [facility];  Service: Gynecology;  Laterality: N/A;   SHOULDER SURGERY     VAGINECTOMY, PARTIAL N/A 03/30/2024   Procedure: VAGINECTOMY, PARTIAL;  Surgeon: Viktoria Comer SAUNDERS, MD;  Location: [facility];  Service: Gynecology;  Laterality: N/A;  endocervical curettage    Family History  Adopted: Yes    Social History   Socioeconomic History   Marital status: Widowed    Spouse name: Not on file   Number of children: Not on file   Years of education: Not on file   Highest education level: Bachelor's degree (e.g., BA, AB, BS)  Occupational History   Occupation: BOA    Employer: [employer]  Tobacco Use   Smoking status: Never    Passive exposure: Never   Smokeless tobacco: Never  Vaping Use   Vaping status: Never Used  Substance and Sexual Activity   Alcohol use: No   Drug use: No   Sexual activity: Not Currently    Birth control/protection: None    Comment: postmenopausal  Other Topics Concern   Not on file  Social History Narrative   Not on file   Social Drivers of Health   Financial Resource Strain: Low  Risk  (03/05/2023)   Overall Financial Resource Strain (CARDIA)    Difficulty of Paying Living Expenses: Not hard at all  Food Insecurity: No Food Insecurity (03/23/2024)   Hunger Vital Sign    Worried About Running Out of Food in the Last Year: Never true    Ran Out of Food in the Last Year: Never true  Transportation Needs: No Transportation Needs (03/23/2024)   PRAPARE - Administrator, Civil Service (Medical): No    Lack of Transportation (Non-Medical): No  Physical Activity:  Sufficiently Active (03/05/2023)   Exercise Vital Sign    Days of Exercise per Week: 3 days    Minutes of Exercise per Session: 60 min  Stress: Stress Concern Present (03/05/2023)   [employer] of Occupational Health - Occupational Stress Questionnaire    Feeling of Stress : To some extent  Social Connections: Moderately Integrated (03/05/2023)   Social Connection and Isolation Panel    Frequency of Communication with Friends and Family: More than three times a week    Frequency of Social Gatherings with Friends and Family: Three times a week    Attends Religious Services: More than 4 times per year    Active Member of Clubs or Organizations: Yes    Attends Banker Meetings: More than 4 times per year    Marital Status: Widowed    Current Medications:  Current Outpatient Medications:    estradiol  (ESTRACE ) 0.5 MG tablet, Take 1 tablet (0.5 mg total) by mouth daily., Disp: 90 tablet, Rfl: 3   meloxicam  (MOBIC ) 15 MG tablet, Take 1 tablet (15 mg total) by mouth daily with a meal (Patient taking differently: Take 15 mg by mouth daily as needed.), Disp: 30 tablet, Rfl: 2   metoprolol  succinate (TOPROL -XL) 50 MG 24 hr tablet, Take 50 mg by mouth daily. Take with or immediately following a meal., Disp: , Rfl:    omeprazole  (PRILOSEC) 20 MG capsule, Take 1 capsule (20 mg total) by mouth as needed., Disp: 30 capsule, Rfl: 3   progesterone  (PROMETRIUM ) 100 MG capsule, Take 1 capsule by mouth nightly on days 1-14 of each month., Disp: 42 capsule, Rfl: 3   SUMAtriptan  (IMITREX ) 100 MG tablet, Take 1 tablet (100 mg total) by mouth once as needed for up to 1 dose for migraine. May repeat in 2 hours if headache persists or recurs., Disp: 9 tablet, Rfl: 11  Review of Systems: Denies appetite changes, fevers, chills, fatigue, unexplained weight changes. Denies hearing loss, neck lumps or masses, mouth sores, ringing in ears or voice changes. Denies cough or wheezing.  Denies shortness  of breath. Denies chest pain or palpitations. Denies leg swelling. Denies abdominal distention, pain, blood in stools, constipation, diarrhea, nausea, vomiting, or early satiety. Denies pain with intercourse, dysuria, frequency, hematuria or incontinence. Denies hot flashes, pelvic pain, vaginal bleeding or vaginal discharge.   Denies joint pain, back pain or muscle pain/cramps. Denies itching, rash, or wounds. Denies dizziness, headaches, numbness or seizures. Denies swollen lymph nodes or glands, denies easy bruising or bleeding. Denies anxiety, depression, confusion, or decreased concentration.  Physical Exam: Pulse 84   Temp 99.1 F (37.3 C) (Oral)   Resp 19   Wt 254 lb 6.4 oz (115.4 kg)   LMP  (LMP Unknown)   SpO2 98%   BMI 42.33 kg/m  General: Alert, oriented, no acute distress. GU: Normal appearing external genitalia without erythema, excoriation, or lesions.  Speculum exam reveals healing vaginectomy site with 1 suture  still in place.  Minimal bleeding with opening the speculum.  Bimanual exam, vaginal apex smooth.  Laboratory & Radiologic Studies: A. ENDOCERVICAL, CURETTINGS:  Low-grade squamous intraepithelial lesion, (low-grade dysplasia).  Benign endocervical mucosa.   B. VAGINAL, LEFT APEX 3:00 O'CLOCK, BIOPSY:  High-grade squamous intraepithelial lesion, VAIN-2 and VAIN-3  (high-grade dysplasia).  Negative for invasive carcinoma.   Assessment & Plan: Amanda Mack is a 58 y.o. woman status post partial simple vaginectomy for high-grade vaginal dysplasia, ECC showing low-grade dysplasia and benign endocervical mucosa.  Patient is overall doing well postoperatively.  Reviewed pathology from surgery.  Patient was given a copy of her pathology report.  My office is clarifying with pathology margin status however I did cauterize around the area resected some to help consolidate margins.  Also reviewed low-grade dysplasia seen on her ECC.  In the setting of her  HPV related disease, discussed risk of recurrence and recommend that she see her OB/GYN every 6 months for a pelvic exam.  She will need repeat cotesting in 1 year.  12 minutes of total time was spent for this patient encounter, including preparation, face-to-face counseling with the patient and coordination of care, and documentation of the encounter.  Comer Dollar, MD  Division of Gynecologic Oncology  Department of Obstetrics and Gynecology  [facility]   [facility]ospitals

## 2024-05-04 NOTE — Patient Instructions (Signed)
 It was good to see you today.  You are healing well from the procedure.  You may have a little bit of spotting over the next few days.  There is at least 1 stitch still at the top of the vagina that you may see fall out at some point.  In the setting of vaginal precancer related to HPV, even after treatment, because of the risk of recurrence, we recommend closer follow-up.  This will mean visits every 6 months initially with your OB/GYN.  I will let Dr. Cleotilde know my recommendations, but please reach out to her clinic after the new year if they have not contacted you to schedule your next visit.

## 2024-05-08 ENCOUNTER — Encounter (HOSPITAL_BASED_OUTPATIENT_CLINIC_OR_DEPARTMENT_OTHER): Admitting: Physical Therapy

## 2024-05-16 ENCOUNTER — Encounter (HOSPITAL_BASED_OUTPATIENT_CLINIC_OR_DEPARTMENT_OTHER)

## 2024-05-16 ENCOUNTER — Other Ambulatory Visit (HOSPITAL_BASED_OUTPATIENT_CLINIC_OR_DEPARTMENT_OTHER): Payer: Self-pay

## 2024-05-17 ENCOUNTER — Encounter (HOSPITAL_BASED_OUTPATIENT_CLINIC_OR_DEPARTMENT_OTHER): Payer: Self-pay | Admitting: Physical Therapy

## 2024-05-23 ENCOUNTER — Ambulatory Visit (HOSPITAL_BASED_OUTPATIENT_CLINIC_OR_DEPARTMENT_OTHER)

## 2024-05-25 ENCOUNTER — Ambulatory Visit (HOSPITAL_BASED_OUTPATIENT_CLINIC_OR_DEPARTMENT_OTHER)

## 2024-06-01 ENCOUNTER — Encounter (HOSPITAL_BASED_OUTPATIENT_CLINIC_OR_DEPARTMENT_OTHER): Payer: Self-pay

## 2024-06-01 ENCOUNTER — Ambulatory Visit (HOSPITAL_BASED_OUTPATIENT_CLINIC_OR_DEPARTMENT_OTHER): Attending: Orthopedic Surgery

## 2024-06-01 DIAGNOSIS — M79651 Pain in right thigh: Secondary | ICD-10-CM

## 2024-06-01 DIAGNOSIS — R262 Difficulty in walking, not elsewhere classified: Secondary | ICD-10-CM | POA: Insufficient documentation

## 2024-06-01 DIAGNOSIS — M6281 Muscle weakness (generalized): Secondary | ICD-10-CM | POA: Insufficient documentation

## 2024-06-01 DIAGNOSIS — M25661 Stiffness of right knee, not elsewhere classified: Secondary | ICD-10-CM | POA: Insufficient documentation

## 2024-06-01 NOTE — Therapy (Signed)
 OUTPATIENT PHYSICAL THERAPY LOWER EXTREMITY TREATMENT     Patient Name: Amanda Mack MRN: [id number] DOB:Nov 17, 1965, 58 y.o., female Today's Date: 06/01/2024  END OF SESSION:  PT End of Session - 06/01/24 1622     Visit Number 21    Number of Visits 23    Date for Recertification  06/07/24    Authorization Type BCBS    PT Start Time 1621   pt arrived late   PT Stop Time 1650    PT Time Calculation (min) 29 min    Activity Tolerance Patient tolerated treatment well    Behavior During Therapy WFL for tasks assessed/performed                            Past Medical History:  Diagnosis Date   Adopted    Allergy    Arthritis    GERD (gastroesophageal reflux disease)    H/O LEEP 1990   Migraines    Routine general medical examination at a health care facility 03/09/2023   Past Surgical History:  Procedure Laterality Date   CERVICAL CONE BIOPSY     AND d&c   KNEE ARTHROSCOPY Left 2019   LESION DESTRUCTION N/A 03/30/2024   Procedure: DESTRUCTION, LESION, GENITALIA;  Surgeon: Viktoria Comer SAUNDERS, MD;  Location: [facility];  Service: Gynecology;  Laterality: N/A;   SHOULDER SURGERY     VAGINECTOMY, PARTIAL N/A 03/30/2024   Procedure: VAGINECTOMY, PARTIAL;  Surgeon: Viktoria Comer SAUNDERS, MD;  Location: [facility];  Service: Gynecology;  Laterality: N/A;  endocervical curettage   Patient Active Problem List   Diagnosis Date Noted   Vaginal dysplasia 03/30/2024   Routine general medical examination at a health care facility 03/09/2023   Menopausal symptoms 08/13/2022   Hyperlipidemia 09/23/2020   Vitamin D  deficiency, unspecified 03/01/2019   Sinus tachycardia 09/29/2016   Essential hypertension 04/17/2016   Class 3 severe obesity with serious comorbidity and body mass index (BMI) of 40.0 to 44.9 in adult [facility]) 04/17/2016     REFERRING PROVIDER: Bissell, Jaclyn, PA-C  REFERRING DIAG: Pain in R lower limb, M79.604  THERAPY DIAG:  Muscle weakness  (generalized)  Pain in right thigh  Difficulty in walking, not elsewhere classified  Stiffness of right knee, not elsewhere classified  Rationale for Evaluation and Treatment: Rehabilitation  ONSET DATE: 12/17/2023 and 01/02/2024  SUBJECTIVE:   SUBJECTIVE STATEMENT:  Pt reports she had increased R hip pain after being at the [facility] last night. Pt has not attended PT in awhile due to waiting for authorization.      PERTINENT HISTORY: Partial simple vaginectomy, endocervical curettage on 03/30/24. Bilat knee OA, L knee arthroscopy 2013 Hx of SI pain R Shoulder surgery HTN  PAIN:  NPRS:  0/10  now, 1/10 worst Location:  distal R HS and knee Type:  feels tight   PRECAUTIONS: Other: knee OA    WEIGHT BEARING RESTRICTIONS: No  FALLS:  Has patient fallen in last 6 months? 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   PLOF: Independent  PATIENT GOALS: to remove crutches and walk normally   OBJECTIVE:  Note: Objective measures were completed at Evaluation unless otherwise noted.  DIAGNOSTIC FINDINGS: X rays per PA note:  tricompartmental degenerative changes present without acute displaced fracture or dislocation identified.  When compared to radiographs  from Nov 2024, no significant changes identified.  PATIENT SURVEYS:  LEFS  Extreme difficulty/unable (0), Quite a bit of difficulty (1), Moderate difficulty (2), Little difficulty (3), No difficulty (4) Survey date:  01/06/24 02/22/24 03/29/24  Any of your usual work, housework or school activities 0 3 3  2. Usual hobbies, recreational or sporting activities 0 0 0  3. Getting into/out of the bath 3 4 4   4. Walking between rooms 3 3.5 4  5. Putting on socks/shoes 2 4 4   6. Squatting  0 0 0  7. Lifting an object, like a bag of groceries from the floor 0 3 4  8.  Performing light activities around your home 0 3.5 4  9. Performing heavy activities around your home 0 1.5 2  10. Getting into/out of a car 2 4 4   11. Walking 2 blocks 0 1 3.5  12. Walking 1 mile 0 0 0  13. Going up/down 10 stairs (1 flight) 0 3.5 4  14. Standing for 1 hour 0 2 1  15.  sitting for 1 hour 0 4 4  16. Running on even ground 0 0 0  17. Running on uneven ground 0 0 0  18. Making sharp turns while running fast 0 0 0  19. Hopping  0 0 0  20. Rolling over in bed 4 4 4   Score total:  14/80 40/80 45.5/80     COGNITION: Overall cognitive status: Within functional limits for tasks assessed      PALPATION: TTP:  distal HS and mid HS.  medial and lateral R knee   LOWER EXTREMITY ROM:   ROM Right eval Left Eval AROM Right 9/2 Right 10/8 Left 10/8 Right 11/11 Right 12/11 Left 12/11  Hip flexion          Hip extension          Hip abduction          Hip adduction          Hip internal rotation          Hip external rotation          Knee flexion 109 PROM 129 121 127  127    Knee extension 0 deg in resting 7 3 4  Lacking 9 deg 4 3 1   Ankle dorsiflexion          Ankle plantarflexion          Ankle inversion          Ankle eversion           (Blank rows = not tested)  LOWER EXTREMITY MMT:  MMT Right eval Left eval Right 9/2 Right 10/8  Hip flexion      Hip extension      Hip abduction   5/5 5/5  Hip adduction      Hip internal rotation      Hip external rotation      Knee flexion   Pt able to perform prone knee flexion AROM without pain.  Pt tolerated minimal resistance well without pain. 4+/5  Knee extension      Ankle dorsiflexion      Ankle plantarflexion      Ankle inversion      Ankle eversion       (Blank rows = not tested)   GAIT: Comments: Pt ambulated without crutch.  Pt had good stability.  She denies any pain, but reports tightness.  Pt is not limping, though slightly favors it.  Pt has decrease gait speed.                                                                                                                                   TREATMENT:    12/11 Gait x293ft Staggered bridges x20ea S/l hip abduction 2x10ea  Supine SLR 2x10ea Prone HSC with eccentric focus 5# 2x10bil Updated knee ext bil (see objective)   11/11 Pt ambulated 3 laps without an AD.  Supine bridge in staggered position 2x12 Prone HS curls with eccentric focus 4# x10, 5#  2x10 Lateral band walks with RTB above knees x 4 laps Braiding at rail without UE support x4 reps Heel to toe rocking with UE support as needed  Marching on airex without UE support 2x10  PT assessed knee ROM.  See above.   11/6 Pt ambulated 3 laps without an AD.   Standing Weight Shifts f/b on airex in staggered stance Marching on airex with UE support as needed 2x10-15 Heel to toe rocking with UE support as needed  Lateral band walks with RTB above knees x 3 laps Braiding at rail without UE support x4 reps Prone HS curls with eccentric focus 3# x10, 4# x10    PATIENT EDUCATION:  Education details: relevant anatomy, rationale of interventions, POC, dx, prognosis, gait, crutch usage, and HEP.  PT answered questions. Person educated: Patient Education method:  Explanation, Demonstration Education comprehension: verbalized understanding, returned demonstration  HOME EXERCISE PROGRAM:   ASSESSMENT:  CLINICAL IMPRESSION:  Resuming PT following period of hiatus due to waiting for insurance authorization. Measured bil knees at end of session with improvements noted bilaterally in knee extension. Good tolerance for most tasks, though R hip did bother her over greater trochanter when in s/l position. Pt c/o increased R hip soreness at end of session. Adived against repetitive movements and prolonged walking this weekend. Pt can ice hip to manage sx. Will assess pain level at next PT session and progress accordingly.      OBJECTIVE IMPAIRMENTS: Abnormal gait,  decreased activity tolerance, decreased endurance, decreased mobility, difficulty walking, decreased ROM, decreased strength, hypomobility, increased fascial restrictions, impaired flexibility, and pain.   ACTIVITY LIMITATIONS: carrying, bending, standing, squatting, stairs, transfers, and locomotion level, dressing, bathing  PARTICIPATION LIMITATIONS: meal prep, cleaning, laundry, driving, shopping, and community activity  PERSONAL FACTORS: 1-2 comorbidities: bilat knee OA, Hx of SI pain are also affecting patient's functional outcome.   REHAB POTENTIAL: Good  CLINICAL DECISION MAKING: Stable/uncomplicated  EVALUATION COMPLEXITY: Low   GOALS  SHORT TERM GOALS:   Pt will be independent and compliant with HEP for improved pain, strength, ROM, and function.  Baseline: Goal status: MET 8/12 Target date:  01/27/2024  2.  Pt will wean out of KI and off crutches without adverse effects.   Baseline:  Goal status: PARTIALLY MET  9/2--still progressing  10/8 Target date:  01/27/2024  3.  Pt will report she is  able to ambulate community distance without any feelings of instability.  Baseline:  Goal status:  PARTIALLY MET  9/2; no feelings of instability, but using crutch Target date: 02/03/2024  4.  Pt will ambulate with a normalized heel to toe gait pattern without limping.  Baseline:  Goal status:  PROGRESSING  9/2 Target date:  02/10/2024  5.  Pt will be able to perform her self care activities and shower transfers without increased pain and difficulty.  Baseline:  Goal status: GOAL MET   9/2 Target date:  02/17/2024  6.  Pt will be able to tolerate exercises without adverse effects for improved strength and proprioception to assist with returning to PLOF.  Baseline:  Goal status: GOAL MET  9/2 Target date:  02/10/2024  LONG TERM GOALS: Target date: 06/07/2024  Pt will be able to perform stairs with a reciprocal gait with good control without difficulty.  Baseline:  Goal status:   not assessed  2.  Pt will be able to perform her household chores without significant pain and difficulty.  Baseline:  Goal status: GOAL MET  9/2  3.  Pt will ambulate extended community distance without significant pain and difficulty.  Baseline:  Goal status:  PROGRESSING  4.  Pt will demo at least a 4 to 4+/5 MMT strength in R HS for improved performance of functional mobility and tolerance of daily mobility.  Baseline:  Goal status:  GOAL MET  10/8     PLAN:  PT FREQUENCY: 1-2x/wk   PT DURATION: 10 weeks  PLANNED INTERVENTIONS: [id number]- PT Re-evaluation, 97750- Physical Performance Testing, 97110-Therapeutic exercises, 97530- Therapeutic activity, [id number]- Neuromuscular re-education, 97535- Self Care, [id number]- Manual therapy, [phone]- Gait training, [phone]- Aquatic Therapy, [phone]- Electrical stimulation (unattended), [phone]- Electrical stimulation (manual), [id number]- Ultrasound, Patient/Family education, Balance training, Stair training, Taping, Joint mobilization, Spinal mobilization, DME instructions, Cryotherapy, and Moist heat  PLAN FOR NEXT SESSION:  Cont with ther-ex, gait, and neuro re-ed.  Asberry Rodes, PTA  06/01/2024 4:59 PM

## 2024-06-05 ENCOUNTER — Encounter (HOSPITAL_BASED_OUTPATIENT_CLINIC_OR_DEPARTMENT_OTHER): Payer: Self-pay

## 2024-06-05 ENCOUNTER — Ambulatory Visit (HOSPITAL_BASED_OUTPATIENT_CLINIC_OR_DEPARTMENT_OTHER)

## 2024-06-05 DIAGNOSIS — M79651 Pain in right thigh: Secondary | ICD-10-CM

## 2024-06-05 DIAGNOSIS — R262 Difficulty in walking, not elsewhere classified: Secondary | ICD-10-CM

## 2024-06-05 DIAGNOSIS — M6281 Muscle weakness (generalized): Secondary | ICD-10-CM

## 2024-06-05 DIAGNOSIS — M25661 Stiffness of right knee, not elsewhere classified: Secondary | ICD-10-CM

## 2024-06-05 NOTE — Therapy (Signed)
 OUTPATIENT PHYSICAL THERAPY LOWER EXTREMITY TREATMENT     Patient Name: Amanda Mack MRN: [id number] DOB:1966-04-08, 58 y.o., female Today's Date: 06/05/2024  END OF SESSION:  PT End of Session - 06/05/24 1359     Visit Number 22    Number of Visits 23    Date for Recertification  06/07/24    Authorization Type BCBS    PT Start Time 1357   pt arrived late   PT Stop Time 1438    PT Time Calculation (min) 41 min    Activity Tolerance Patient tolerated treatment well    Behavior During Therapy WFL for tasks assessed/performed                             Past Medical History:  Diagnosis Date   Adopted    Allergy    Arthritis    GERD (gastroesophageal reflux disease)    H/O LEEP 1990   Migraines    Routine general medical examination at a health care facility 03/09/2023   Past Surgical History:  Procedure Laterality Date   CERVICAL CONE BIOPSY     AND d&c   KNEE ARTHROSCOPY Left 2019   LESION DESTRUCTION N/A 03/30/2024   Procedure: DESTRUCTION, LESION, GENITALIA;  Surgeon: Viktoria Comer SAUNDERS, MD;  Location: [facility];  Service: Gynecology;  Laterality: N/A;   SHOULDER SURGERY     VAGINECTOMY, PARTIAL N/A 03/30/2024   Procedure: VAGINECTOMY, PARTIAL;  Surgeon: Viktoria Comer SAUNDERS, MD;  Location: [facility];  Service: Gynecology;  Laterality: N/A;  endocervical curettage   Patient Active Problem List   Diagnosis Date Noted   Vaginal dysplasia 03/30/2024   Routine general medical examination at a health care facility 03/09/2023   Menopausal symptoms 08/13/2022   Hyperlipidemia 09/23/2020   Vitamin D  deficiency, unspecified 03/01/2019   Sinus tachycardia 09/29/2016   Essential hypertension 04/17/2016   Class 3 severe obesity with serious comorbidity and body mass index (BMI) of 40.0 to 44.9 in adult [facility]) 04/17/2016     REFERRING PROVIDER: Bissell, Jaclyn, PA-C  REFERRING DIAG: Pain in R lower limb, M79.604  THERAPY DIAG:  Muscle weakness  (generalized)  Pain in right thigh  Difficulty in walking, not elsewhere classified  Stiffness of right knee, not elsewhere classified  Rationale for Evaluation and Treatment: Rehabilitation  ONSET DATE: 12/17/2023 and 01/02/2024  SUBJECTIVE:   SUBJECTIVE STATEMENT: Pt reports mild improvement in lateral hip pain,      PERTINENT HISTORY: Partial simple vaginectomy, endocervical curettage on 03/30/24. Bilat knee OA, L knee arthroscopy 2013 Hx of SI pain R Shoulder surgery HTN  PAIN:  NPRS:  0/10  now, 1/10 worst Location:  distal R HS and knee Type:  feels tight   PRECAUTIONS: Other: knee OA    WEIGHT BEARING RESTRICTIONS: No  FALLS:  Has patient fallen in last 6 months? 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   PLOF: Independent  PATIENT GOALS: to remove crutches and walk normally   OBJECTIVE:  Note: Objective measures were completed at Evaluation unless otherwise noted.  DIAGNOSTIC FINDINGS: X rays per PA note:  tricompartmental degenerative changes present without acute displaced fracture or dislocation identified.  When compared to radiographs from Nov 2024, no significant changes identified.  PATIENT SURVEYS:  LEFS  Extreme difficulty/unable (0), Quite a bit of  difficulty (1), Moderate difficulty (2), Little difficulty (3), No difficulty (4) Survey date:  01/06/24 02/22/24 03/29/24  Any of your usual work, housework or school activities 0 3 3  2. Usual hobbies, recreational or sporting activities 0 0 0  3. Getting into/out of the bath 3 4 4   4. Walking between rooms 3 3.5 4  5. Putting on socks/shoes 2 4 4   6. Squatting  0 0 0  7. Lifting an object, like a bag of groceries from the floor 0 3 4  8. Performing light activities around your home 0 3.5 4  9. Performing heavy activities around your home 0  1.5 2  10. Getting into/out of a car 2 4 4   11. Walking 2 blocks 0 1 3.5  12. Walking 1 mile 0 0 0  13. Going up/down 10 stairs (1 flight) 0 3.5 4  14. Standing for 1 hour 0 2 1  15.  sitting for 1 hour 0 4 4  16. Running on even ground 0 0 0  17. Running on uneven ground 0 0 0  18. Making sharp turns while running fast 0 0 0  19. Hopping  0 0 0  20. Rolling over in bed 4 4 4   Score total:  14/80 40/80 45.5/80     COGNITION: Overall cognitive status: Within functional limits for tasks assessed      PALPATION: TTP:  distal HS and mid HS.  medial and lateral R knee   LOWER EXTREMITY ROM:   ROM Right eval Left Eval AROM Right 9/2 Right 10/8 Left 10/8 Right 11/11 Right 12/11 Left 12/11  Hip flexion          Hip extension          Hip abduction          Hip adduction          Hip internal rotation          Hip external rotation          Knee flexion 109 PROM 129 121 127  127    Knee extension 0 deg in resting 7 3 4  Lacking 9 deg 4 3 1   Ankle dorsiflexion          Ankle plantarflexion          Ankle inversion          Ankle eversion           (Blank rows = not tested)  LOWER EXTREMITY MMT:  MMT Right eval Left eval Right 9/2 Right 10/8  Hip flexion      Hip extension      Hip abduction   5/5 5/5  Hip adduction      Hip internal rotation      Hip external rotation      Knee flexion   Pt able to perform prone knee flexion AROM without pain.  Pt tolerated minimal resistance well without pain. 4+/5  Knee extension      Ankle dorsiflexion      Ankle plantarflexion      Ankle inversion      Ankle eversion       (Blank rows = not tested)   GAIT: Comments: Pt ambulated without crutch.  Pt had good stability.  She denies any pain, but reports tightness.  Pt is not limping, though slightly favors it.  Pt has decrease gait speed.  TREATMENT:    12/15 Sci fit bike x59min L3 Long sit HSS 3x30sec ea Modified downward dog 10sec x4 Gait x460ft Staggered bridges x20ea S/l hip abduction 2x10ea  Supine SLR 2x10ea STS 3x10 Prone HSC machine 10lbs 3x10 Cybex leg press 85# 3x10    12/11 Gait x246ft Staggered bridges x20ea S/l hip abduction 2x10ea  Supine SLR 2x10ea Prone HSC with eccentric focus 5# 2x10bil Updated knee ext bil (see objective)   11/11 Pt ambulated 3 laps without an AD.  Supine bridge in staggered position 2x12 Prone HS curls with eccentric focus 4# x10, 5#  2x10 Lateral band walks with RTB above knees x 4 laps Braiding at rail without UE support x4 reps Heel to toe rocking with UE support as needed  Marching on airex without UE support 2x10  PT assessed knee ROM.  See above.   11/6 Pt ambulated 3 laps without an AD.   Standing Weight Shifts f/b on airex in staggered stance Marching on airex with UE support as needed 2x10-15 Heel to toe rocking with UE support as needed  Lateral band walks with RTB above knees x 3 laps Braiding at rail without UE support x4 reps Prone HS curls with eccentric focus 3# x10, 4# x10    PATIENT EDUCATION:  Education details: relevant anatomy, rationale of interventions, POC, dx, prognosis, gait, crutch usage, and HEP.  PT answered questions. Person educated: Patient Education method:  Explanation, Demonstration Education comprehension: verbalized understanding, returned demonstration  HOME EXERCISE PROGRAM:   ASSESSMENT:  CLINICAL IMPRESSION:  Pt had good tolerance for progressions to strengthening today. Initiated prone HSC machine in gym which was well tolerated without increase in pain. She did report muscular fatigue with exercises, but denied increase in hip or HS pain. Pt will benefit from continued progression of concentric and eccentric strengthening to return to full functional level.      OBJECTIVE IMPAIRMENTS: Abnormal gait,  decreased activity tolerance, decreased endurance, decreased mobility, difficulty walking, decreased ROM, decreased strength, hypomobility, increased fascial restrictions, impaired flexibility, and pain.   ACTIVITY LIMITATIONS: carrying, bending, standing, squatting, stairs, transfers, and locomotion level, dressing, bathing  PARTICIPATION LIMITATIONS: meal prep, cleaning, laundry, driving, shopping, and community activity  PERSONAL FACTORS: 1-2 comorbidities: bilat knee OA, Hx of SI pain are also affecting patient's functional outcome.   REHAB POTENTIAL: Good  CLINICAL DECISION MAKING: Stable/uncomplicated  EVALUATION COMPLEXITY: Low   GOALS  SHORT TERM GOALS:   Pt will be independent and compliant with HEP for improved pain, strength, ROM, and function.  Baseline: Goal status: MET 8/12 Target date:  01/27/2024  2.  Pt will wean out of KI and off crutches without adverse effects.   Baseline:  Goal status: PARTIALLY MET  9/2--still progressing  10/8 Target date:  01/27/2024  3.  Pt will report she is able to ambulate community distance without any feelings of instability.  Baseline:  Goal status:  PARTIALLY MET  9/2; no feelings of instability, but using crutch Target date: 02/03/2024  4.  Pt will ambulate with a normalized heel to toe gait pattern without limping.  Baseline:  Goal status:  PROGRESSING  9/2 Target date:  02/10/2024  5.  Pt will be able to perform her self care activities and shower transfers without increased pain and difficulty.  Baseline:  Goal status: GOAL MET   9/2 Target date:  02/17/2024  6.  Pt will be able to tolerate exercises without adverse effects for improved strength and proprioception to assist with returning to  PLOF.  Baseline:  Goal status: GOAL MET  9/2 Target date:  02/10/2024  LONG TERM GOALS: Target date: 06/07/2024  Pt will be able to perform stairs with a reciprocal gait with good control without difficulty.  Baseline:  Goal status:   not assessed  2.  Pt will be able to perform her household chores without significant pain and difficulty.  Baseline:  Goal status: GOAL MET  9/2  3.  Pt will ambulate extended community distance without significant pain and difficulty.  Baseline:  Goal status:  PROGRESSING  4.  Pt will demo at least a 4 to 4+/5 MMT strength in R HS for improved performance of functional mobility and tolerance of daily mobility.  Baseline:  Goal status:  GOAL MET  10/8     PLAN:  PT FREQUENCY: 1-2x/wk   PT DURATION: 10 weeks  PLANNED INTERVENTIONS: 97164- PT Re-evaluation, 97750- Physical Performance Testing, 97110-Therapeutic exercises, 97530- Therapeutic activity, [id number]- Neuromuscular re-education, 97535- Self Care, 02859- Manual therapy, [phone]- Gait training, [phone]- Aquatic Therapy, [phone]- Electrical stimulation (unattended), [phone]- Electrical stimulation (manual), [id number]- Ultrasound, Patient/Family education, Balance training, Stair training, Taping, Joint mobilization, Spinal mobilization, DME instructions, Cryotherapy, and Moist heat  PLAN FOR NEXT SESSION:  Cont with ther-ex, gait, and neuro re-ed.  Asberry Rodes, PTA  06/05/2024 2:56 PM

## 2024-06-07 ENCOUNTER — Ambulatory Visit (HOSPITAL_BASED_OUTPATIENT_CLINIC_OR_DEPARTMENT_OTHER): Admitting: Physical Therapy

## 2024-06-07 DIAGNOSIS — R262 Difficulty in walking, not elsewhere classified: Secondary | ICD-10-CM

## 2024-06-07 DIAGNOSIS — M79651 Pain in right thigh: Secondary | ICD-10-CM

## 2024-06-07 DIAGNOSIS — M6281 Muscle weakness (generalized): Secondary | ICD-10-CM | POA: Diagnosis not present

## 2024-06-07 DIAGNOSIS — M25661 Stiffness of right knee, not elsewhere classified: Secondary | ICD-10-CM

## 2024-06-07 NOTE — Therapy (Signed)
 OUTPATIENT PHYSICAL THERAPY LOWER EXTREMITY TREATMENT PROGRESS NOTE     Patient Name: Amanda Mack MRN: [id number] DOB:08/27/1965, 58 y.o., female Today's Date: 06/08/2024  END OF SESSION:  PT End of Session - 06/07/24 1407     Visit Number 23    Number of Visits 29    Date for Recertification  07/12/24    Authorization Type BCBS    PT Start Time 1318    PT Stop Time 1404    PT Time Calculation (min) 46 min    Activity Tolerance Patient tolerated treatment well    Behavior During Therapy WFL for tasks assessed/performed                              Past Medical History:  Diagnosis Date   Adopted    Allergy    Arthritis    GERD (gastroesophageal reflux disease)    H/O LEEP 1990   Migraines    Routine general medical examination at a health care facility 03/09/2023   Past Surgical History:  Procedure Laterality Date   CERVICAL CONE BIOPSY     AND d&c   KNEE ARTHROSCOPY Left 2019   LESION DESTRUCTION N/A 03/30/2024   Procedure: DESTRUCTION, LESION, GENITALIA;  Surgeon: Viktoria Comer SAUNDERS, MD;  Location: [facility];  Service: Gynecology;  Laterality: N/A;   SHOULDER SURGERY     VAGINECTOMY, PARTIAL N/A 03/30/2024   Procedure: VAGINECTOMY, PARTIAL;  Surgeon: Viktoria Comer SAUNDERS, MD;  Location: [facility];  Service: Gynecology;  Laterality: N/A;  endocervical curettage   Patient Active Problem List   Diagnosis Date Noted   Vaginal dysplasia 03/30/2024   Routine general medical examination at a health care facility 03/09/2023   Menopausal symptoms 08/13/2022   Hyperlipidemia 09/23/2020   Vitamin D  deficiency, unspecified 03/01/2019   Sinus tachycardia 09/29/2016   Essential hypertension 04/17/2016   Class 3 severe obesity with serious comorbidity and body mass index (BMI) of 40.0 to 44.9 in adult [facility]) 04/17/2016     REFERRING PROVIDER: Bissell, Jaclyn, PA-C  REFERRING DIAG: Pain in R lower limb, M79.604  THERAPY DIAG:  Muscle weakness  (generalized)  Pain in right thigh  Difficulty in walking, not elsewhere classified  Stiffness of right knee, not elsewhere classified  Rationale for Evaluation and Treatment: Rehabilitation  ONSET DATE: 12/17/2023 and 01/02/2024  SUBJECTIVE:   SUBJECTIVE STATEMENT: Pt keeps crutches in her car, though is not using them.  Pt is ambulating community distance without an AD.  Pt walked into [employer] which included 3 blocks, a ramp, and stairs.  Pt had no HS pain, but did have R hip pain which interfered with her walking to her car.  She had R knee swelling afterwards.  Pt reports less swelling and discomfort.  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  Pt states her R LE doesn't feel as strong and stable with jumping in the pool, though doesn't have pain.  Pt denies any adverse effects after prior treatment just soreness.  Pt states it feels tight today.  Pt reports compliance with HEP. Pt is limited with standing duration and ambulation distance.    PERTINENT HISTORY: Partial simple vaginectomy, endocervical curettage on 03/30/24.  Bilat knee OA, L knee arthroscopy 2013 Hx of SI pain R Shoulder surgery HTN  PAIN:  NPRS:  0/10  now, 1/10 worst which is rare Location:  R HS Type:  feels tight  NPRS:  1/10 current, 1-2/10 worst Location:  R medial knee   PRECAUTIONS: Other: knee OA    WEIGHT BEARING RESTRICTIONS: No  FALLS:  Has patient fallen in last 6 months? 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   PLOF: Independent  PATIENT GOALS: to remove crutches and walk normally   OBJECTIVE:  Note: Objective measures were completed at Evaluation unless otherwise noted.  DIAGNOSTIC FINDINGS: X rays per PA note:  tricompartmental degenerative changes present without acute displaced fracture or dislocation identified.  When compared  to radiographs from Nov 2024, no significant changes identified.  PATIENT SURVEYS:  LEFS  Extreme difficulty/unable (0), Quite a bit of difficulty (1), Moderate difficulty (2), Little difficulty (3), No difficulty (4) Survey date:  01/06/24 02/22/24 03/29/24 06/07/24  Any of your usual work, housework or school activities 0 3 3 3   2. Usual hobbies, recreational or sporting activities 0 0 0 3  3. Getting into/out of the bath 3 4 4 4   4. Walking between rooms 3 3.5 4 4   5. Putting on socks/shoes 2 4 4 4   6. Squatting  0 0 0 3  7. Lifting an object, like a bag of groceries from the floor 0 3 4 4   8. Performing light activities around your home 0 3.5 4 4   9. Performing heavy activities around your home 0 1.5 2 3   10. Getting into/out of a car 2 4 4 4   11. Walking 2 blocks 0 1 3.5 3.5  12. Walking 1 mile 0 0 0 1  13. Going up/down 10 stairs (1 flight) 0 3.5 4 3   14. Standing for 1 hour 0 2 1 2   15.  sitting for 1 hour 0 4 4 4   16. Running on even ground 0 0 0 0  17. Running on uneven ground 0 0 0 0  18. Making sharp turns while running fast 0 0 0 0  19. Hopping  0 0 0 2  20. Rolling over in bed 4 4 4 4   Score total:  14/80 40/80 45.5/80 51.5/80     COGNITION: Overall cognitive status: Within functional limits for tasks assessed       LOWER EXTREMITY ROM:   ROM Right eval Left Eval AROM Right 9/2 Right 10/8 Left 10/8 Right 11/11 Right 12/11 Left 12/11 Right 12/7  Hip flexion           Hip extension           Hip abduction           Hip adduction           Hip internal rotation           Hip external rotation           Knee flexion 109 PROM 129 121 127  127  122 122-130  Knee extension 0 deg in resting 7 3 4  Lacking 9 deg 4 3 1 5   Ankle dorsiflexion           Ankle plantarflexion           Ankle inversion           Ankle eversion            (Blank rows = not tested)  LOWER EXTREMITY MMT:  MMT Right eval Left eval Right 9/2 Right 10/8 Right  Hip flexion     5/5  Hip extension       Hip abduction   5/5 5/5 5/5  Hip adduction       Hip internal rotation       Hip external rotation       Knee flexion   Pt able to perform prone knee flexion AROM without pain.  Pt tolerated minimal resistance well without pain. 4+/5 5/5  Knee extension     5/5  Ankle dorsiflexion       Ankle plantarflexion       Ankle inversion       Ankle eversion        (Blank rows = not tested)   GAIT: Comments: Pt ambulated without crutch.  Pt has good stability.  Pt had no limp and slightly favored R LE.  Pt has a heel to toe gait.  She has decreased clearance on R foot and states that is her normal gait.   STAIRS:  Pt ascended stairs with a reciprocal gait and descended stairs with a step through to reciprocal gait Pt states she is fearful with descending stairs and has occasional pain with ascending stairs.                                                                                                                                TREATMENT:    12/17 Reviewed current function, HEP compliance, pain levels, and response to prior treatment. Assessed Knee ROM and LE strength.  See above. Staggered bridges 2x10 SLS on floor x 12 sec, 16 sec Cone taps on chair  2x10, on stool 10 reps with multiple attempts   12/15 Sci fit bike x60min L3 Long sit HSS 3x30sec ea Modified downward dog 10sec x4 Gait x443ft Staggered bridges x20ea S/l hip abduction 2x10ea  Supine SLR 2x10ea STS 3x10 Prone HSC machine 10lbs 3x10 Cybex leg press 85# 3x10    12/11 Gait x271ft Staggered bridges x20ea S/l hip abduction 2x10ea  Supine SLR 2x10ea Prone HSC with eccentric focus 5# 2x10bil Updated knee ext bil (see objective)   11/11 Pt ambulated 3 laps without an AD.  Supine bridge in staggered position 2x12 Prone HS curls with eccentric focus 4# x10, 5#  2x10 Lateral band walks with RTB above knees x 4 laps Braiding at rail without UE support x4 reps Heel to toe rocking with UE  support as needed  Marching on airex without UE support 2x10  PT assessed knee ROM.  See above.   11/6 Pt ambulated 3 laps without an AD.   Standing Weight Shifts f/b on airex in staggered stance Marching on airex with UE support as needed 2x10-15 Heel to toe rocking with UE support as needed  Lateral band walks with RTB above knees x 3 laps Braiding at rail without UE support x4 reps Prone HS curls with eccentric focus 3# x10, 4# x10    PATIENT EDUCATION:  Education details: relevant anatomy, objective findings, goal progress, rationale  of interventions, POC, dx, prognosis, gait, crutches, and HEP.  PT answered questions. Person educated: Patient Education method:  Explanation, Demonstration, verbal cues Education comprehension: verbalized understanding, returned demonstration, verbal cues required  HOME EXERCISE PROGRAM:   ASSESSMENT:  CLINICAL IMPRESSION:  Pt has been making good progress since having her last procedure.  Though she is making good progress, she continues to be limited with standing duration and ambulation distance.  Her pain in her HS is significantly better, though she is having knee pain.  Pt is not using crutches anymore and has increased her ambulation distance.  Pt has been having hip pain which limits her walking.  She has improved with gait.  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  Pt is limited with R knee extension ROM.  Pt demonstrates improved HS strength to 5/5 MMT.  She also had 5/5 in knee ext, hip flexion, and hip abd.  Pt has deficits with descending stairs though is able to ascend stairs well.  Pt demonstrates improved self perceived disability on the LEFS though not clinically significant.  She is making good progress toward goals.  Pt has met STG's #1,2,5,6 and LTG's #2,4 and partially met STG's #3,4 and LTG #1.  Pt should benefit from continued skilled PT to address  ongoing goals, improve eccentric strength and stability, reduce pain, and to assist in returning pt to desired level of function.       OBJECTIVE IMPAIRMENTS: Abnormal gait, decreased activity tolerance, decreased endurance, decreased mobility, difficulty walking, decreased ROM, decreased strength, hypomobility, increased fascial restrictions, impaired flexibility, and pain.   ACTIVITY LIMITATIONS: carrying, bending, standing, squatting, stairs, transfers, and locomotion level, dressing, bathing  PARTICIPATION LIMITATIONS: meal prep, cleaning, laundry, driving, shopping, and community activity  PERSONAL FACTORS: 1-2 comorbidities: bilat knee OA, Hx of SI pain are also affecting patient's functional outcome.   REHAB POTENTIAL: Good  CLINICAL DECISION MAKING: Stable/uncomplicated  EVALUATION COMPLEXITY: Low   GOALS  SHORT TERM GOALS:   Pt will be independent and compliant with HEP for improved pain, strength, ROM, and function.  Baseline: Goal status: MET 8/12 Target date:  01/27/2024  2.  Pt will wean out of KI and off crutches without adverse effects.   Baseline:  Goal status: GOAL MET  12/17 Target date:  01/27/2024  3.  Pt will report she is able to ambulate community distance without any feelings of instability.  Baseline:  Goal status:  PARTIALLY MET  9/2; no feelings of instability, but using crutch Target date: 02/03/2024  4.  Pt will ambulate with a normalized heel to toe gait pattern without limping.  Baseline:  Goal status:  PARTIALLY MET 12/17 Target date:  02/10/2024  5.  Pt will be able to perform her self care activities and shower transfers without increased pain and difficulty.  Baseline:  Goal status: GOAL MET   9/2 Target date:  02/17/2024  6.  Pt will be able to tolerate exercises without adverse effects for improved strength and proprioception to assist with returning to PLOF.  Baseline:  Goal status: GOAL MET  9/2 Target date:  02/10/2024  LONG TERM  GOALS: Target date: 07/12/2024  Pt will be able to perform stairs with a reciprocal gait with good control without difficulty.  Baseline:  Goal status:  50% MET  2.  Pt will be able to perform her household chores without significant pain and difficulty.  Baseline:  Goal status: GOAL MET  9/2  3.  Pt will ambulate extended community distance without significant pain and difficulty.  Baseline:  Goal status:  PROGRESSING  4.  Pt will demo at least a 4 to 4+/5 MMT strength in R HS for improved performance of functional mobility and tolerance of daily mobility.  Baseline:  Goal status:  GOAL MET  10/8     PLAN:  PT FREQUENCY: 1-2x/wk   PT DURATION:  5 weeks (pt may miss an entire week)  PLANNED INTERVENTIONS: 02835- PT Re-evaluation, 97750- Physical Performance Testing, 97110-Therapeutic exercises, 97530- Therapeutic activity, [id number]- Neuromuscular re-education, 97535- Self Care, 02859- Manual therapy, [phone]- Gait training, [phone]- Aquatic Therapy, [phone]- Electrical stimulation (unattended), [phone]- Electrical stimulation (manual), [id number]- Ultrasound, Patient/Family education, Balance training, Stair training, Taping, Joint mobilization, Spinal mobilization, DME instructions, Cryotherapy, and Moist heat  PLAN FOR NEXT SESSION:  Cont with ther-ex, gait, and neuro re-ed.  Leigh Minerva III PT, DPT 06/08/2024 8:48 AM

## 2024-06-20 ENCOUNTER — Ambulatory Visit (HOSPITAL_BASED_OUTPATIENT_CLINIC_OR_DEPARTMENT_OTHER): Payer: Self-pay

## 2024-06-20 ENCOUNTER — Encounter (HOSPITAL_BASED_OUTPATIENT_CLINIC_OR_DEPARTMENT_OTHER): Payer: Self-pay

## 2024-06-20 DIAGNOSIS — R262 Difficulty in walking, not elsewhere classified: Secondary | ICD-10-CM

## 2024-06-20 DIAGNOSIS — M25661 Stiffness of right knee, not elsewhere classified: Secondary | ICD-10-CM

## 2024-06-20 DIAGNOSIS — M6281 Muscle weakness (generalized): Secondary | ICD-10-CM

## 2024-06-20 DIAGNOSIS — M79651 Pain in right thigh: Secondary | ICD-10-CM

## 2024-06-20 NOTE — Therapy (Signed)
 " OUTPATIENT PHYSICAL THERAPY LOWER EXTREMITY TREATMENT      Patient Name: Amanda Mack MRN: [id number] DOB:March 13, 1966, 58 y.o., female Today's Date: 06/20/2024  END OF SESSION:  PT End of Session - 06/20/24 1153     Visit Number 24    Number of Visits 29    Date for Recertification  07/12/24    Authorization Type [employer]    PT Start Time 1149    PT Stop Time 1230    PT Time Calculation (min) 41 min    Activity Tolerance Patient tolerated treatment well    Behavior During Therapy WFL for tasks assessed/performed                               Past Medical History:  Diagnosis Date   Adopted    Allergy    Arthritis    GERD (gastroesophageal reflux disease)    H/O LEEP 1990   Migraines    Routine general medical examination at a health care facility 03/09/2023   Past Surgical History:  Procedure Laterality Date   CERVICAL CONE BIOPSY     AND d&c   KNEE ARTHROSCOPY Left 2019   LESION DESTRUCTION N/A 03/30/2024   Procedure: DESTRUCTION, LESION, GENITALIA;  Surgeon: Viktoria Comer SAUNDERS, MD;  Location: [facility];  Service: Gynecology;  Laterality: N/A;   SHOULDER SURGERY     VAGINECTOMY, PARTIAL N/A 03/30/2024   Procedure: VAGINECTOMY, PARTIAL;  Surgeon: Viktoria Comer SAUNDERS, MD;  Location: [facility];  Service: Gynecology;  Laterality: N/A;  endocervical curettage   Patient Active Problem List   Diagnosis Date Noted   Vaginal dysplasia 03/30/2024   Routine general medical examination at a health care facility 03/09/2023   Menopausal symptoms 08/13/2022   Hyperlipidemia 09/23/2020   Vitamin D  deficiency, unspecified 03/01/2019   Sinus tachycardia 09/29/2016   Essential hypertension 04/17/2016   Class 3 severe obesity with serious comorbidity and body mass index (BMI) of 40.0 to 44.9 in adult [facility]) 04/17/2016     REFERRING PROVIDER: Bissell, Jaclyn, PA-C  REFERRING DIAG: Pain in R lower limb, M79.604  THERAPY DIAG:  Muscle weakness  (generalized)  Pain in right thigh  Difficulty in walking, not elsewhere classified  Stiffness of right knee, not elsewhere classified  Rationale for Evaluation and Treatment: Rehabilitation  ONSET DATE: 12/17/2023 and 01/02/2024  SUBJECTIVE:   SUBJECTIVE STATEMENT: Pt reports some stiffness in knees afterdriving 10 hours over Christmas. Had one episode of a hip pointer when away, but stretching HS helped with this.    PERTINENT HISTORY: Partial simple vaginectomy, endocervical curettage on 03/30/24.  Bilat knee OA, L knee arthroscopy 2013 Hx of SI pain R Shoulder surgery HTN  PAIN:  NPRS:  0/10  now, 1/10 worst which is rare Location:  R HS Type:  feels tight  NPRS:  1/10 current, 1-2/10 worst Location:  R medial knee   PRECAUTIONS: Other: knee OA    WEIGHT BEARING RESTRICTIONS: No  FALLS:  Has patient fallen in last 6 months? 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   PLOF: Independent  PATIENT GOALS: to remove crutches and walk normally   OBJECTIVE:  Note: Objective measures were completed at Evaluation unless otherwise noted.  DIAGNOSTIC FINDINGS: X rays per PA note:  tricompartmental degenerative changes present without acute displaced  fracture or dislocation identified.  When compared to radiographs from Nov 2024, no significant changes identified.  PATIENT SURVEYS:  LEFS  Extreme difficulty/unable (0), Quite a bit of difficulty (1), Moderate difficulty (2), Little difficulty (3), No difficulty (4) Survey date:  01/06/24 02/22/24 03/29/24 06/07/24  Any of your usual work, housework or school activities 0 3 3 3   2. Usual hobbies, recreational or sporting activities 0 0 0 3  3. Getting into/out of the bath 3 4 4 4   4. Walking between rooms 3 3.5 4 4   5. Putting on socks/shoes 2 4 4 4   6. Squatting  0  0 0 3  7. Lifting an object, like a bag of groceries from the floor 0 3 4 4   8. Performing light activities around your home 0 3.5 4 4   9. Performing heavy activities around your home 0 1.5 2 3   10. Getting into/out of a car 2 4 4 4   11. Walking 2 blocks 0 1 3.5 3.5  12. Walking 1 mile 0 0 0 1  13. Going up/down 10 stairs (1 flight) 0 3.5 4 3   14. Standing for 1 hour 0 2 1 2   15.  sitting for 1 hour 0 4 4 4   16. Running on even ground 0 0 0 0  17. Running on uneven ground 0 0 0 0  18. Making sharp turns while running fast 0 0 0 0  19. Hopping  0 0 0 2  20. Rolling over in bed 4 4 4 4   Score total:  14/80 40/80 45.5/80 51.5/80     COGNITION: Overall cognitive status: Within functional limits for tasks assessed       LOWER EXTREMITY ROM:   ROM Right eval Left Eval AROM Right 9/2 Right 10/8 Left 10/8 Right 11/11 Right 12/11 Left 12/11 Right 12/7  Hip flexion           Hip extension           Hip abduction           Hip adduction           Hip internal rotation           Hip external rotation           Knee flexion 109 PROM 129 121 127  127  122 122-130  Knee extension 0 deg in resting 7 3 4  Lacking 9 deg 4 3 1 5   Ankle dorsiflexion           Ankle plantarflexion           Ankle inversion           Ankle eversion            (Blank rows = not tested)  LOWER EXTREMITY MMT:  MMT Right eval Left eval Right 9/2 Right 10/8 Right  Hip flexion     5/5  Hip extension       Hip abduction   5/5 5/5 5/5  Hip adduction       Hip internal rotation       Hip external rotation       Knee flexion   Pt able to perform prone knee flexion AROM without pain.  Pt tolerated minimal resistance well without pain. 4+/5 5/5  Knee extension     5/5  Ankle dorsiflexion       Ankle plantarflexion       Ankle inversion       Ankle  eversion        (Blank rows = not tested)   GAIT: Comments: Pt ambulated without crutch.  Pt has good stability.  Pt had no limp and slightly favored  R LE.  Pt has a heel to toe gait.  She has decreased clearance on R foot and states that is her normal gait.   STAIRS:  Pt ascended stairs with a reciprocal gait and descended stairs with a step through to reciprocal gait Pt states she is fearful with descending stairs and has occasional pain with ascending stairs.                                                                                                                                TREATMENT:    12/30 Longsit HSS 30sec x3ea  Modified downward dog 3x30sec Scifit bike L3 x73min Supine SLR 3x15ea Staggered bridges 2x15 ea Prone HSC 5# 3x10 STS 3x10 from lowered plinth Hip hikes off 4 2x10ea Step ups at bottom stair with cues for glute max activation 2x10ea     12/17 Reviewed current function, HEP compliance, pain levels, and response to prior treatment. Assessed Knee ROM and LE strength.  See above. Staggered bridges 2x10 SLS on floor x 12 sec, 16 sec Cone taps on chair  2x10, on stool 10 reps with multiple attempts   12/15 Sci fit bike x58min L3 Long sit HSS 3x30sec ea Modified downward dog 10sec x4 Gait x482ft Staggered bridges x20ea S/l hip abduction 2x10ea  Supine SLR 2x10ea STS 3x10 Prone HSC machine 10lbs 3x10 Cybex leg press 85# 3x10    12/11 Gait x221ft Staggered bridges x20ea S/l hip abduction 2x10ea  Supine SLR 2x10ea Prone HSC with eccentric focus 5# 2x10bil Updated knee ext bil (see objective)   11/11 Pt ambulated 3 laps without an AD.  Supine bridge in staggered position 2x12 Prone HS curls with eccentric focus 4# x10, 5#  2x10 Lateral band walks with RTB above knees x 4 laps Braiding at rail without UE support x4 reps Heel to toe rocking with UE support as needed  Marching on airex without UE support 2x10  PT assessed knee ROM.  See above.   11/6 Pt ambulated 3 laps without an AD.   Standing Weight Shifts f/b on airex in staggered stance Marching on airex with UE support as  needed 2x10-15 Heel to toe rocking with UE support as needed  Lateral band walks with RTB above knees x 3 laps Braiding at rail without UE support x4 reps Prone HS curls with eccentric focus 3# x10, 4# x10    PATIENT EDUCATION:  Education details: relevant anatomy, objective findings, goal progress, rationale of interventions, POC, dx, prognosis, gait, crutches, and HEP.  PT answered questions. Person educated: Patient Education method:  Explanation, Demonstration, verbal cues Education comprehension: verbalized understanding, returned demonstration, verbal cues required  HOME EXERCISE PROGRAM:   ASSESSMENT:  CLINICAL IMPRESSION:  Pt had excellent tolerance for  progression s to strengthening today. She responded well to cuing to increase [facility] and activation of glutes with step ups both concentrically and eccentrically. Fatigue reported with increased reps of STS. No c/o pain throughout session. Verbally advised in progressions to HEP. Will continue to progress strengthening and NMC as tolerated.   PN: Pt has been making good progress since having her last procedure.  Though she is making good progress, she continues to be limited with standing duration and ambulation distance.  Her pain in her HS is significantly better, though she is having knee pain.  Pt is not using crutches anymore and has increased her ambulation distance.  Pt has been having hip pain which limits her walking.  She has improved with gait.  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  Pt is limited with R knee extension ROM.  Pt demonstrates improved HS strength to 5/5 MMT.  She also had 5/5 in knee ext, hip flexion, and hip abd.  Pt has deficits with descending stairs though is able to ascend stairs well.  Pt demonstrates improved self perceived disability on the LEFS though not clinically significant.  She is making good progress toward goals.  Pt  has met STG's #1,2,5,6 and LTG's #2,4 and partially met STG's #3,4 and LTG #1.  Pt should benefit from continued skilled PT to address ongoing goals, improve eccentric strength and stability, reduce pain, and to assist in returning pt to desired level of function.       OBJECTIVE IMPAIRMENTS: Abnormal gait, decreased activity tolerance, decreased endurance, decreased mobility, difficulty walking, decreased ROM, decreased strength, hypomobility, increased fascial restrictions, impaired flexibility, and pain.   ACTIVITY LIMITATIONS: carrying, bending, standing, squatting, stairs, transfers, and locomotion level, dressing, bathing  PARTICIPATION LIMITATIONS: meal prep, cleaning, laundry, driving, shopping, and community activity  PERSONAL FACTORS: 1-2 comorbidities: bilat knee OA, Hx of SI pain are also affecting patient's functional outcome.   REHAB POTENTIAL: Good  CLINICAL DECISION MAKING: Stable/uncomplicated  EVALUATION COMPLEXITY: Low   GOALS  SHORT TERM GOALS:   Pt will be independent and compliant with HEP for improved pain, strength, ROM, and function.  Baseline: Goal status: MET 8/12 Target date:  01/27/2024  2.  Pt will wean out of KI and off crutches without adverse effects.   Baseline:  Goal status: GOAL MET  12/17 Target date:  01/27/2024  3.  Pt will report she is able to ambulate community distance without any feelings of instability.  Baseline:  Goal status:  PARTIALLY MET  9/2; no feelings of instability, but using crutch Target date: 02/03/2024  4.  Pt will ambulate with a normalized heel to toe gait pattern without limping.  Baseline:  Goal status:  PARTIALLY MET 12/17 Target date:  02/10/2024  5.  Pt will be able to perform her self care activities and shower transfers without increased pain and difficulty.  Baseline:  Goal status: GOAL MET   9/2 Target date:  02/17/2024  6.  Pt will be able to tolerate exercises without adverse effects for improved  strength and proprioception to assist with returning to PLOF.  Baseline:  Goal status: GOAL MET  9/2 Target date:  02/10/2024  LONG TERM GOALS: Target date: 07/12/2024  Pt will be able to perform stairs with a reciprocal gait with good control without difficulty.  Baseline:  Goal status:  50% MET  2.  Pt will be able to  perform her household chores without significant pain and difficulty.  Baseline:  Goal status: GOAL MET  9/2  3.  Pt will ambulate extended community distance without significant pain and difficulty.  Baseline:  Goal status:  PROGRESSING  4.  Pt will demo at least a 4 to 4+/5 MMT strength in R HS for improved performance of functional mobility and tolerance of daily mobility.  Baseline:  Goal status:  GOAL MET  10/8     PLAN:  PT FREQUENCY: 1-2x/wk   PT DURATION:  5 weeks (pt may miss an entire week)  PLANNED INTERVENTIONS: 02835- PT Re-evaluation, 97750- Physical Performance Testing, 97110-Therapeutic exercises, 97530- Therapeutic activity, [id number]- Neuromuscular re-education, 97535- Self Care, 02859- Manual therapy, [phone]- Gait training, [phone]- Aquatic Therapy, [phone]- Electrical stimulation (unattended), [phone]- Electrical stimulation (manual), [id number]- Ultrasound, Patient/Family education, Balance training, Stair training, Taping, Joint mobilization, Spinal mobilization, DME instructions, Cryotherapy, and Moist heat  PLAN FOR NEXT SESSION:  Cont with ther-ex, gait, and neuro re-ed.  Asberry Rodes, PTA  06/20/2024 1:20 PM           "

## 2024-06-26 ENCOUNTER — Ambulatory Visit (HOSPITAL_BASED_OUTPATIENT_CLINIC_OR_DEPARTMENT_OTHER)

## 2024-06-29 ENCOUNTER — Ambulatory Visit (HOSPITAL_BASED_OUTPATIENT_CLINIC_OR_DEPARTMENT_OTHER): Attending: Orthopedic Surgery

## 2024-06-29 ENCOUNTER — Encounter (HOSPITAL_BASED_OUTPATIENT_CLINIC_OR_DEPARTMENT_OTHER): Payer: Self-pay

## 2024-06-29 DIAGNOSIS — M25661 Stiffness of right knee, not elsewhere classified: Secondary | ICD-10-CM | POA: Insufficient documentation

## 2024-06-29 DIAGNOSIS — M6281 Muscle weakness (generalized): Secondary | ICD-10-CM | POA: Insufficient documentation

## 2024-06-29 DIAGNOSIS — R262 Difficulty in walking, not elsewhere classified: Secondary | ICD-10-CM | POA: Diagnosis present

## 2024-06-29 DIAGNOSIS — M79651 Pain in right thigh: Secondary | ICD-10-CM | POA: Insufficient documentation

## 2024-06-29 NOTE — Therapy (Signed)
 " OUTPATIENT PHYSICAL THERAPY LOWER EXTREMITY TREATMENT      Patient Name: Amanda Mack MRN: [id number] DOB:May 28, 1966, 59 y.o., female Today's Date: 06/29/2024  END OF SESSION:  PT End of Session - 06/29/24 1436     Visit Number 25    Number of Visits 29    Date for Recertification  07/12/24    Authorization Type [employer]    PT Start Time 1434    PT Stop Time 1517    PT Time Calculation (min) 43 min    Activity Tolerance Patient tolerated treatment well    Behavior During Therapy WFL for tasks assessed/performed                                Past Medical History:  Diagnosis Date   Adopted    Allergy    Arthritis    GERD (gastroesophageal reflux disease)    H/O LEEP 1990   Migraines    Routine general medical examination at a health care facility 03/09/2023   Past Surgical History:  Procedure Laterality Date   CERVICAL CONE BIOPSY     AND d&c   KNEE ARTHROSCOPY Left 2019   LESION DESTRUCTION N/A 03/30/2024   Procedure: DESTRUCTION, LESION, GENITALIA;  Surgeon: Viktoria Comer SAUNDERS, MD;  Location: [facility];  Service: Gynecology;  Laterality: N/A;   SHOULDER SURGERY     VAGINECTOMY, PARTIAL N/A 03/30/2024   Procedure: VAGINECTOMY, PARTIAL;  Surgeon: Viktoria Comer SAUNDERS, MD;  Location: [facility];  Service: Gynecology;  Laterality: N/A;  endocervical curettage   Patient Active Problem List   Diagnosis Date Noted   Vaginal dysplasia 03/30/2024   Routine general medical examination at a health care facility 03/09/2023   Menopausal symptoms 08/13/2022   Hyperlipidemia 09/23/2020   Vitamin D  deficiency, unspecified 03/01/2019   Sinus tachycardia 09/29/2016   Essential hypertension 04/17/2016   Class 3 severe obesity with serious comorbidity and body mass index (BMI) of 40.0 to 44.9 in adult [facility]) 04/17/2016     REFERRING PROVIDER: Bissell, Jaclyn, PA-C  REFERRING DIAG: Pain in R lower limb, M79.604  THERAPY DIAG:  Muscle weakness  (generalized)  Pain in right thigh  Difficulty in walking, not elsewhere classified  Stiffness of right knee, not elsewhere classified  Rationale for Evaluation and Treatment: Rehabilitation  ONSET DATE: 12/17/2023 and 01/02/2024  SUBJECTIVE:   SUBJECTIVE STATEMENT: 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    PERTINENT HISTORY: Partial simple vaginectomy, endocervical curettage on 03/30/24.  Bilat knee OA, L knee arthroscopy 2013 Hx of SI pain R Shoulder surgery HTN  PAIN:  NPRS:  0/10  now, 1/10 worst which is rare Location:  R HS Type:  feels tight  NPRS:  1/10 current, 1-2/10 worst Location:  R medial knee   PRECAUTIONS: Other: knee OA    WEIGHT BEARING RESTRICTIONS: No  FALLS:  Has patient fallen in last 6 months? 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   PLOF: Independent  PATIENT GOALS: to remove crutches and walk normally   OBJECTIVE:  Note: Objective measures were completed at Evaluation unless otherwise noted.  DIAGNOSTIC FINDINGS: X rays per PA note:  tricompartmental degenerative changes present without acute displaced fracture or dislocation  identified.  When compared to radiographs from Nov 2024, no significant changes identified.  PATIENT SURVEYS:  LEFS  Extreme difficulty/unable (0), Quite a bit of difficulty (1), Moderate difficulty (2), Little difficulty (3), No difficulty (4) Survey date:  01/06/24 02/22/24 03/29/24 06/07/24  Any of your usual work, housework or school activities 0 3 3 3   2. Usual hobbies, recreational or sporting activities 0 0 0 3  3. Getting into/out of the bath 3 4 4 4   4. Walking between rooms 3 3.5 4 4   5. Putting on socks/shoes 2 4 4 4   6. Squatting  0 0 0 3  7. Lifting an object, like a  bag of groceries from the floor 0 3 4 4   8. Performing light activities around your home 0 3.5 4 4   9. Performing heavy activities around your home 0 1.5 2 3   10. Getting into/out of a car 2 4 4 4   11. Walking 2 blocks 0 1 3.5 3.5  12. Walking 1 mile 0 0 0 1  13. Going up/down 10 stairs (1 flight) 0 3.5 4 3   14. Standing for 1 hour 0 2 1 2   15.  sitting for 1 hour 0 4 4 4   16. Running on even ground 0 0 0 0  17. Running on uneven ground 0 0 0 0  18. Making sharp turns while running fast 0 0 0 0  19. Hopping  0 0 0 2  20. Rolling over in bed 4 4 4 4   Score total:  14/80 40/80 45.5/80 51.5/80     COGNITION: Overall cognitive status: Within functional limits for tasks assessed       LOWER EXTREMITY ROM:   ROM Right eval Left Eval AROM Right 9/2 Right 10/8 Left 10/8 Right 11/11 Right 12/11 Left 12/11 Right 12/7 Left 1/8  Right 1/8  Hip flexion             Hip extension             Hip abduction             Hip adduction             Hip internal rotation             Hip external rotation             Knee flexion 109 PROM 129 121 127  127  122 122-130    Knee extension 0 deg in resting 7 3 4  Lacking 9 deg 4 3 1 5 1 2   Ankle dorsiflexion             Ankle plantarflexion             Ankle inversion             Ankle eversion              (Blank rows = not tested)  LOWER EXTREMITY MMT:  MMT Right eval Left eval Right 9/2 Right 10/8 Right  Hip flexion     5/5  Hip extension       Hip abduction   5/5 5/5 5/5  Hip adduction       Hip internal rotation       Hip external rotation       Knee flexion   Pt able to perform prone knee flexion AROM without pain.  Pt tolerated minimal resistance well without pain. 4+/5 5/5  Knee extension     5/5  Ankle dorsiflexion       Ankle plantarflexion       Ankle inversion       Ankle eversion        (Blank rows = not tested)   GAIT: Comments: Pt ambulated without crutch.  Pt has good stability.  Pt had no limp and  slightly favored R LE.  Pt has a heel to toe gait.  She has decreased clearance on R foot and states that is her normal gait.   STAIRS:  Pt ascended stairs with a reciprocal gait and descended stairs with a step through to reciprocal gait Pt states she is fearful with descending stairs and has occasional pain with ascending stairs.                                                                                                                                TREATMENT:    1/8 Scifit bike L4 x43min Longsit HSS 30sec x3ea  Updated knee extension ROM Modified downward dog 3x30sec  Prone HSC machine 10lbs 3x10 Cybex leg press 100# 3x10 STS 3x10 from lowered plinth Step ups at bottom stair with cues for glute max activation 2x10ea  12/30 Longsit HSS 30sec x3ea  Modified downward dog 3x30sec Scifit bike L3 x22min Supine SLR 3x15ea Staggered bridges 2x15 ea Prone HSC 5# 3x10 STS 3x10 from lowered plinth Hip hikes off 4 2x10ea Step ups at bottom stair with cues for glute max activation 2x10ea     12/17 Reviewed current function, HEP compliance, pain levels, and response to prior treatment. Assessed Knee ROM and LE strength.  See above. Staggered bridges 2x10 SLS on floor x 12 sec, 16 sec Cone taps on chair  2x10, on stool 10 reps with multiple attempts   12/15 Sci fit bike x1min L3 Long sit HSS 3x30sec ea Modified downward dog 10sec x4 Gait x418ft Staggered bridges x20ea S/l hip abduction 2x10ea  Supine SLR 2x10ea STS 3x10 Prone HSC machine 10lbs 3x10 Cybex leg press 85# 3x10    12/11 Gait x256ft Staggered bridges x20ea S/l hip abduction 2x10ea  Supine SLR 2x10ea Prone HSC with eccentric focus 5# 2x10bil Updated knee ext bil (see objective)     PATIENT EDUCATION:  Education details: relevant anatomy, objective findings, goal progress, rationale of interventions, POC, dx, prognosis, gait, crutches, and HEP.  PT answered questions. Person educated:  Patient Education method:  Explanation, Demonstration, verbal cues Education comprehension: verbalized understanding, returned demonstration, verbal cues required  HOME EXERCISE PROGRAM:   ASSESSMENT:  CLINICAL IMPRESSION: Patient measured at 1 degree left knee extension and 2 degrees right knee extension today.  This does appear improvement however patient is limited slightly by ongoing hamstring tension.  Instructed patient on continued self STM and stretching to perform at home to address this.  Continue to work on strengthening for posterior chain with good response.  Patient does express fear of reinjury though no pain reported with exercises.  Patient  will benefit from continued posterior chain strengthening and progressing functional activities.  PN: Pt has been making good progress since having her last procedure.  Though she is making good progress, she continues to be limited with standing duration and ambulation distance.  Her pain in her HS is significantly better, though she is having knee pain.  Pt is not using crutches anymore and has increased her ambulation distance.  Pt has been having hip pain which limits her walking.  She has improved with gait.  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  Pt is limited with R knee extension ROM.  Pt demonstrates improved HS strength to 5/5 MMT.  She also had 5/5 in knee ext, hip flexion, and hip abd.  Pt has deficits with descending stairs though is able to ascend stairs well.  Pt demonstrates improved self perceived disability on the LEFS though not clinically significant.  She is making good progress toward goals.  Pt has met STG's #1,2,5,6 and LTG's #2,4 and partially met STG's #3,4 and LTG #1.  Pt should benefit from continued skilled PT to address ongoing goals, improve eccentric strength and stability, reduce pain, and to assist in returning pt to desired level of  function.       OBJECTIVE IMPAIRMENTS: Abnormal gait, decreased activity tolerance, decreased endurance, decreased mobility, difficulty walking, decreased ROM, decreased strength, hypomobility, increased fascial restrictions, impaired flexibility, and pain.   ACTIVITY LIMITATIONS: carrying, bending, standing, squatting, stairs, transfers, and locomotion level, dressing, bathing  PARTICIPATION LIMITATIONS: meal prep, cleaning, laundry, driving, shopping, and community activity  PERSONAL FACTORS: 1-2 comorbidities: bilat knee OA, Hx of SI pain are also affecting patient's functional outcome.   REHAB POTENTIAL: Good  CLINICAL DECISION MAKING: Stable/uncomplicated  EVALUATION COMPLEXITY: Low   GOALS  SHORT TERM GOALS:   Pt will be independent and compliant with HEP for improved pain, strength, ROM, and function.  Baseline: Goal status: MET 8/12 Target date:  01/27/2024  2.  Pt will wean out of KI and off crutches without adverse effects.   Baseline:  Goal status: GOAL MET  12/17 Target date:  01/27/2024  3.  Pt will report she is able to ambulate community distance without any feelings of instability.  Baseline:  Goal status:  PARTIALLY MET  9/2; no feelings of instability, but using crutch Target date: 02/03/2024  4.  Pt will ambulate with a normalized heel to toe gait pattern without limping.  Baseline:  Goal status:  PARTIALLY MET 12/17 Target date:  02/10/2024  5.  Pt will be able to perform her self care activities and shower transfers without increased pain and difficulty.  Baseline:  Goal status: GOAL MET   9/2 Target date:  02/17/2024  6.  Pt will be able to tolerate exercises without adverse effects for improved strength and proprioception to assist with returning to PLOF.  Baseline:  Goal status: GOAL MET  9/2 Target date:  02/10/2024  LONG TERM GOALS: Target date: 07/12/2024  Pt will be able to perform stairs with a reciprocal gait with good control without  difficulty.  Baseline:  Goal status:  50% MET  2.  Pt will be able to perform her household chores without significant pain and difficulty.  Baseline:  Goal status: GOAL MET  9/2  3.  Pt will ambulate extended community distance without significant pain and difficulty.  Baseline:  Goal status:  PROGRESSING  4.  Pt will demo at least a 4 to 4+/5 MMT strength in R HS for improved performance of functional mobility and tolerance of daily mobility.  Baseline:  Goal status:  GOAL MET  10/8     PLAN:  PT FREQUENCY: 1-2x/wk   PT DURATION:  5 weeks (pt may miss an entire week)  PLANNED INTERVENTIONS: 02835- PT Re-evaluation, 97750- Physical Performance Testing, 97110-Therapeutic exercises, 97530- Therapeutic activity, [id number]- Neuromuscular re-education, 97535- Self Care, 02859- Manual therapy, [phone]- Gait training, [phone]- Aquatic Therapy, [phone]- Electrical stimulation (unattended), [phone]- Electrical stimulation (manual), [id number]- Ultrasound, Patient/Family education, Balance training, Stair training, Taping, Joint mobilization, Spinal mobilization, DME instructions, Cryotherapy, and Moist heat  PLAN FOR NEXT SESSION:  Cont with ther-ex, gait, and neuro re-ed.  Asberry Rodes, PTA  06/29/2024 3:30 PM           "

## 2024-07-03 ENCOUNTER — Ambulatory Visit (HOSPITAL_BASED_OUTPATIENT_CLINIC_OR_DEPARTMENT_OTHER): Attending: Orthopedic Surgery

## 2024-07-03 ENCOUNTER — Encounter (HOSPITAL_BASED_OUTPATIENT_CLINIC_OR_DEPARTMENT_OTHER): Payer: Self-pay

## 2024-07-03 DIAGNOSIS — M6281 Muscle weakness (generalized): Secondary | ICD-10-CM | POA: Diagnosis present

## 2024-07-03 DIAGNOSIS — M25661 Stiffness of right knee, not elsewhere classified: Secondary | ICD-10-CM | POA: Diagnosis present

## 2024-07-03 DIAGNOSIS — R262 Difficulty in walking, not elsewhere classified: Secondary | ICD-10-CM | POA: Diagnosis present

## 2024-07-03 DIAGNOSIS — M79651 Pain in right thigh: Secondary | ICD-10-CM | POA: Diagnosis present

## 2024-07-03 NOTE — Therapy (Signed)
 " OUTPATIENT PHYSICAL THERAPY LOWER EXTREMITY TREATMENT      Patient Name: Amanda Mack MRN: [id number] DOB:07/28/1965, 59 y.o., female Today's Date: 07/03/2024  END OF SESSION:  PT End of Session - 07/03/24 1150     Visit Number 26    Number of Visits 29    Date for Recertification  07/12/24    Authorization Type [employer]    PT Start Time 1147    PT Stop Time 1230    PT Time Calculation (min) 43 min    Activity Tolerance Patient tolerated treatment well    Behavior During Therapy WFL for tasks assessed/performed                                 Past Medical History:  Diagnosis Date   Adopted    Allergy    Arthritis    GERD (gastroesophageal reflux disease)    H/O LEEP 1990   Migraines    Routine general medical examination at a health care facility 03/09/2023   Past Surgical History:  Procedure Laterality Date   CERVICAL CONE BIOPSY     AND d&c   KNEE ARTHROSCOPY Left 2019   LESION DESTRUCTION N/A 03/30/2024   Procedure: DESTRUCTION, LESION, GENITALIA;  Surgeon: Viktoria Comer SAUNDERS, MD;  Location: [facility];  Service: Gynecology;  Laterality: N/A;   SHOULDER SURGERY     VAGINECTOMY, PARTIAL N/A 03/30/2024   Procedure: VAGINECTOMY, PARTIAL;  Surgeon: Viktoria Comer SAUNDERS, MD;  Location: [facility];  Service: Gynecology;  Laterality: N/A;  endocervical curettage   Patient Active Problem List   Diagnosis Date Noted   Vaginal dysplasia 03/30/2024   Routine general medical examination at a health care facility 03/09/2023   Menopausal symptoms 08/13/2022   Hyperlipidemia 09/23/2020   Vitamin D  deficiency, unspecified 03/01/2019   Sinus tachycardia 09/29/2016   Essential hypertension 04/17/2016   Class 3 severe obesity with serious comorbidity and body mass index (BMI) of 40.0 to 44.9 in adult [facility]) 04/17/2016     REFERRING PROVIDER: Bissell, Jaclyn, PA-C  REFERRING DIAG: Pain in R lower limb, M79.604  THERAPY DIAG:  Muscle weakness  (generalized)  Pain in right thigh  Difficulty in walking, not elsewhere classified  Stiffness of right knee, not elsewhere classified  Rationale for Evaluation and Treatment: Rehabilitation  ONSET DATE: 12/17/2023 and 01/02/2024  SUBJECTIVE:   SUBJECTIVE STATEMENT: Pt reports she continues to have stiffness in knees after sitting for extended periods.    PERTINENT HISTORY: Partial simple vaginectomy, endocervical curettage on 03/30/24.  Bilat knee OA, L knee arthroscopy 2013 Hx of SI pain R Shoulder surgery HTN  PAIN:  NPRS:  0/10  now, 1/10 worst which is rare Location:  R HS Type:  feels tight  NPRS:  1/10 current, 1-2/10 worst Location:  R medial knee   PRECAUTIONS: Other: knee OA    WEIGHT BEARING RESTRICTIONS: No  FALLS:  Has patient fallen in last 6 months? 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   PLOF: Independent  PATIENT GOALS: to remove crutches and walk normally   OBJECTIVE:  Note: Objective measures were completed at Evaluation unless otherwise noted.  DIAGNOSTIC FINDINGS: X rays per PA note:  tricompartmental degenerative changes present without acute displaced fracture or dislocation identified.  When compared to radiographs from  Nov 2024, no significant changes identified.  PATIENT SURVEYS:  LEFS  Extreme difficulty/unable (0), Quite a bit of difficulty (1), Moderate difficulty (2), Little difficulty (3), No difficulty (4) Survey date:  01/06/24 02/22/24 03/29/24 06/07/24  Any of your usual work, housework or school activities 0 3 3 3   2. Usual hobbies, recreational or sporting activities 0 0 0 3  3. Getting into/out of the bath 3 4 4 4   4. Walking between rooms 3 3.5 4 4   5. Putting on socks/shoes 2 4 4 4   6. Squatting  0 0 0 3  7. Lifting an object, like a bag of groceries from the  floor 0 3 4 4   8. Performing light activities around your home 0 3.5 4 4   9. Performing heavy activities around your home 0 1.5 2 3   10. Getting into/out of a car 2 4 4 4   11. Walking 2 blocks 0 1 3.5 3.5  12. Walking 1 mile 0 0 0 1  13. Going up/down 10 stairs (1 flight) 0 3.5 4 3   14. Standing for 1 hour 0 2 1 2   15.  sitting for 1 hour 0 4 4 4   16. Running on even ground 0 0 0 0  17. Running on uneven ground 0 0 0 0  18. Making sharp turns while running fast 0 0 0 0  19. Hopping  0 0 0 2  20. Rolling over in bed 4 4 4 4   Score total:  14/80 40/80 45.5/80 51.5/80     COGNITION: Overall cognitive status: Within functional limits for tasks assessed       LOWER EXTREMITY ROM:   ROM Right eval Left Eval AROM Right 9/2 Right 10/8 Left 10/8 Right 11/11 Right 12/11 Left 12/11 Right 12/7 Left 1/8  Right 1/8  Hip flexion             Hip extension             Hip abduction             Hip adduction             Hip internal rotation             Hip external rotation             Knee flexion 109 PROM 129 121 127  127  122 122-130    Knee extension 0 deg in resting 7 3 4  Lacking 9 deg 4 3 1 5 1 2   Ankle dorsiflexion             Ankle plantarflexion             Ankle inversion             Ankle eversion              (Blank rows = not tested)  LOWER EXTREMITY MMT:  MMT Right eval Left eval Right 9/2 Right 10/8 Right  Hip flexion     5/5  Hip extension       Hip abduction   5/5 5/5 5/5  Hip adduction       Hip internal rotation       Hip external rotation       Knee flexion   Pt able to perform prone knee flexion AROM without pain.  Pt tolerated minimal resistance well without pain. 4+/5 5/5  Knee extension     5/5  Ankle dorsiflexion  Ankle plantarflexion       Ankle inversion       Ankle eversion        (Blank rows = not tested)   GAIT: Comments: Pt ambulated without crutch.  Pt has good stability.  Pt had no limp and slightly favored R LE.  Pt has  a heel to toe gait.  She has decreased clearance on R foot and states that is her normal gait.   STAIRS:  Pt ascended stairs with a reciprocal gait and descended stairs with a step through to reciprocal gait Pt states she is fearful with descending stairs and has occasional pain with ascending stairs.                                                                                                                                TREATMENT:    1/12 Scifit bike L4 x102min Longsit HSS 30sec x3ea  Updated knee extension ROM (1deg R, 0deg L) Modified downward dog 3x30sec  STS 3x10 from lowered plinth Step ups 8 box with cues for glute max activation 2x10ea Standing HSC RTB 2x10 Squats 2x10 Seated scooter rolls down hall x187ft  1/8 Scifit bike L4 x9min Longsit HSS 30sec x3ea  Updated knee extension ROM Modified downward dog 3x30sec  Prone HSC machine 10lbs 3x10 Cybex leg press 100# 3x10 STS 3x10 from lowered plinth Step ups at bottom stair with cues for glute max activation 2x10ea  12/30 Longsit HSS 30sec x3ea  Modified downward dog 3x30sec Scifit bike L3 x72min Supine SLR 3x15ea Staggered bridges 2x15 ea Prone HSC 5# 3x10 STS 3x10 from lowered plinth Hip hikes off 4 2x10ea Step ups at bottom stair with cues for glute max activation 2x10ea     12/17 Reviewed current function, HEP compliance, pain levels, and response to prior treatment. Assessed Knee ROM and LE strength.  See above. Staggered bridges 2x10 SLS on floor x 12 sec, 16 sec Cone taps on chair  2x10, on stool 10 reps with multiple attempts   12/15 Sci fit bike x54min L3 Long sit HSS 3x30sec ea Modified downward dog 10sec x4 Gait x454ft Staggered bridges x20ea S/l hip abduction 2x10ea  Supine SLR 2x10ea STS 3x10 Prone HSC machine 10lbs 3x10 Cybex leg press 85# 3x10    12/11 Gait x230ft Staggered bridges x20ea S/l hip abduction 2x10ea  Supine SLR 2x10ea Prone HSC with eccentric focus 5#  2x10bil Updated knee ext bil (see objective)     PATIENT EDUCATION:  Education details: relevant anatomy, objective findings, goal progress, rationale of interventions, POC, dx, prognosis, gait, crutches, and HEP.  PT answered questions. Person educated: Patient Education method:  Explanation, Demonstration, verbal cues Education comprehension: verbalized understanding, returned demonstration, verbal cues required  HOME EXERCISE PROGRAM:   ASSESSMENT:  CLINICAL IMPRESSION: Patient continues to measure with excellent knee extension bil. Able to progress HS strengthening today without c/o discomfort. Fatigues quickly with step ups on 8 box. Will continue  to progress as tolerated.  PN: Pt has been making good progress since having her last procedure.  Though she is making good progress, she continues to be limited with standing duration and ambulation distance.  Her pain in her HS is significantly better, though she is having knee pain.  Pt is not using crutches anymore and has increased her ambulation distance.  Pt has been having hip pain which limits her walking.  She has improved with gait.  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  Pt is limited with R knee extension ROM.  Pt demonstrates improved HS strength to 5/5 MMT.  She also had 5/5 in knee ext, hip flexion, and hip abd.  Pt has deficits with descending stairs though is able to ascend stairs well.  Pt demonstrates improved self perceived disability on the LEFS though not clinically significant.  She is making good progress toward goals.  Pt has met STG's #1,2,5,6 and LTG's #2,4 and partially met STG's #3,4 and LTG #1.  Pt should benefit from continued skilled PT to address ongoing goals, improve eccentric strength and stability, reduce pain, and to assist in returning pt to desired level of function.       OBJECTIVE IMPAIRMENTS: Abnormal gait, decreased  activity tolerance, decreased endurance, decreased mobility, difficulty walking, decreased ROM, decreased strength, hypomobility, increased fascial restrictions, impaired flexibility, and pain.   ACTIVITY LIMITATIONS: carrying, bending, standing, squatting, stairs, transfers, and locomotion level, dressing, bathing  PARTICIPATION LIMITATIONS: meal prep, cleaning, laundry, driving, shopping, and community activity  PERSONAL FACTORS: 1-2 comorbidities: bilat knee OA, Hx of SI pain are also affecting patient's functional outcome.   REHAB POTENTIAL: Good  CLINICAL DECISION MAKING: Stable/uncomplicated  EVALUATION COMPLEXITY: Low   GOALS  SHORT TERM GOALS:   Pt will be independent and compliant with HEP for improved pain, strength, ROM, and function.  Baseline: Goal status: MET 8/12 Target date:  01/27/2024  2.  Pt will wean out of KI and off crutches without adverse effects.   Baseline:  Goal status: GOAL MET  12/17 Target date:  01/27/2024  3.  Pt will report she is able to ambulate community distance without any feelings of instability.  Baseline:  Goal status:  PARTIALLY MET  9/2; no feelings of instability, but using crutch Target date: 02/03/2024  4.  Pt will ambulate with a normalized heel to toe gait pattern without limping.  Baseline:  Goal status:  PARTIALLY MET 12/17 Target date:  02/10/2024  5.  Pt will be able to perform her self care activities and shower transfers without increased pain and difficulty.  Baseline:  Goal status: GOAL MET   9/2 Target date:  02/17/2024  6.  Pt will be able to tolerate exercises without adverse effects for improved strength and proprioception to assist with returning to PLOF.  Baseline:  Goal status: GOAL MET  9/2 Target date:  02/10/2024  LONG TERM GOALS: Target date: 07/12/2024  Pt will be able to perform stairs with a reciprocal gait with good control without difficulty.  Baseline:  Goal status:  50% MET  2.  Pt will be able to  perform her household chores without significant pain and difficulty.  Baseline:  Goal status: GOAL MET  9/2  3.  Pt will ambulate extended community distance without significant pain and difficulty.  Baseline:  Goal status:  PROGRESSING  4.  Pt will demo at least a  4 to 4+/5 MMT strength in R HS for improved performance of functional mobility and tolerance of daily mobility.  Baseline:  Goal status:  GOAL MET  10/8     PLAN:  PT FREQUENCY: 1-2x/wk   PT DURATION:  5 weeks (pt may miss an entire week)  PLANNED INTERVENTIONS: 02835- PT Re-evaluation, 97750- Physical Performance Testing, 97110-Therapeutic exercises, 97530- Therapeutic activity, [id number]- Neuromuscular re-education, 97535- Self Care, 02859- Manual therapy, [phone]- Gait training, [phone]- Aquatic Therapy, [phone]- Electrical stimulation (unattended), [phone]- Electrical stimulation (manual), [id number]- Ultrasound, Patient/Family education, Balance training, Stair training, Taping, Joint mobilization, Spinal mobilization, DME instructions, Cryotherapy, and Moist heat  PLAN FOR NEXT SESSION:  Cont with ther-ex, gait, and neuro re-ed.  Asberry Rodes, PTA  07/03/2024 2:04 PM           "

## 2024-07-06 ENCOUNTER — Encounter (HOSPITAL_BASED_OUTPATIENT_CLINIC_OR_DEPARTMENT_OTHER): Payer: Self-pay

## 2024-07-06 ENCOUNTER — Ambulatory Visit (HOSPITAL_BASED_OUTPATIENT_CLINIC_OR_DEPARTMENT_OTHER)

## 2024-07-06 DIAGNOSIS — M79651 Pain in right thigh: Secondary | ICD-10-CM

## 2024-07-06 DIAGNOSIS — R262 Difficulty in walking, not elsewhere classified: Secondary | ICD-10-CM

## 2024-07-06 DIAGNOSIS — M6281 Muscle weakness (generalized): Secondary | ICD-10-CM | POA: Diagnosis not present

## 2024-07-06 DIAGNOSIS — M25661 Stiffness of right knee, not elsewhere classified: Secondary | ICD-10-CM

## 2024-07-06 NOTE — Therapy (Signed)
 " OUTPATIENT PHYSICAL THERAPY LOWER EXTREMITY TREATMENT      Patient Name: Amanda Mack MRN: [id number] DOB:March 27, 1966, 59 y.o., female Today's Date: 07/06/2024  END OF SESSION:  PT End of Session - 07/06/24 1609     Visit Number 27    Number of Visits 29    Date for Recertification  07/12/24    Authorization Type [employer]    PT Start Time 1602    PT Stop Time 1645    PT Time Calculation (min) 43 min    Activity Tolerance Patient tolerated treatment well    Behavior During Therapy WFL for tasks assessed/performed                                  Past Medical History:  Diagnosis Date   Adopted    Allergy    Arthritis    GERD (gastroesophageal reflux disease)    H/O LEEP 1990   Migraines    Routine general medical examination at a health care facility 03/09/2023   Past Surgical History:  Procedure Laterality Date   CERVICAL CONE BIOPSY     AND d&c   KNEE ARTHROSCOPY Left 2019   LESION DESTRUCTION N/A 03/30/2024   Procedure: DESTRUCTION, LESION, GENITALIA;  Surgeon: Viktoria Comer SAUNDERS, MD;  Location: [facility];  Service: Gynecology;  Laterality: N/A;   SHOULDER SURGERY     VAGINECTOMY, PARTIAL N/A 03/30/2024   Procedure: VAGINECTOMY, PARTIAL;  Surgeon: Viktoria Comer SAUNDERS, MD;  Location: [facility];  Service: Gynecology;  Laterality: N/A;  endocervical curettage   Patient Active Problem List   Diagnosis Date Noted   Vaginal dysplasia 03/30/2024   Routine general medical examination at a health care facility 03/09/2023   Menopausal symptoms 08/13/2022   Hyperlipidemia 09/23/2020   Vitamin D  deficiency, unspecified 03/01/2019   Sinus tachycardia 09/29/2016   Essential hypertension 04/17/2016   Class 3 severe obesity with serious comorbidity and body mass index (BMI) of 40.0 to 44.9 in adult [facility]) 04/17/2016     REFERRING PROVIDER: Bissell, Jaclyn, PA-C  REFERRING DIAG: Pain in R lower limb, M79.604  THERAPY DIAG:  Muscle weakness  (generalized)  Difficulty in walking, not elsewhere classified  Pain in right thigh  Stiffness of right knee, not elsewhere classified  Rationale for Evaluation and Treatment: Rehabilitation  ONSET DATE: 12/17/2023 and 01/02/2024  SUBJECTIVE:   SUBJECTIVE STATEMENT: Pt reports ongoing knee weakness, but overall getting better.   PERTINENT HISTORY: Partial simple vaginectomy, endocervical curettage on 03/30/24.  Bilat knee OA, L knee arthroscopy 2013 Hx of SI pain R Shoulder surgery HTN  PAIN:  NPRS:  0/10  now, 1/10 worst which is rare Location:  R HS Type:  feels tight  NPRS:  1/10 current, 1-2/10 worst Location:  R medial knee   PRECAUTIONS: Other: knee OA    WEIGHT BEARING RESTRICTIONS: No  FALLS:  Has patient fallen in last 6 months? 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   PLOF: Independent  PATIENT GOALS: to remove crutches and walk normally   OBJECTIVE:  Note: Objective measures were completed at Evaluation unless otherwise noted.  DIAGNOSTIC FINDINGS: X rays per PA note:  tricompartmental degenerative changes present without acute displaced fracture or dislocation identified.  When compared to radiographs from Nov 2024, no significant changes  identified.  PATIENT SURVEYS:  LEFS  Extreme difficulty/unable (0), Quite a bit of difficulty (1), Moderate difficulty (2), Little difficulty (3), No difficulty (4) Survey date:  01/06/24 02/22/24 03/29/24 06/07/24  Any of your usual work, housework or school activities 0 3 3 3   2. Usual hobbies, recreational or sporting activities 0 0 0 3  3. Getting into/out of the bath 3 4 4 4   4. Walking between rooms 3 3.5 4 4   5. Putting on socks/shoes 2 4 4 4   6. Squatting  0 0 0 3  7. Lifting an object, like a bag of groceries from the floor 0 3 4 4   8. Performing  light activities around your home 0 3.5 4 4   9. Performing heavy activities around your home 0 1.5 2 3   10. Getting into/out of a car 2 4 4 4   11. Walking 2 blocks 0 1 3.5 3.5  12. Walking 1 mile 0 0 0 1  13. Going up/down 10 stairs (1 flight) 0 3.5 4 3   14. Standing for 1 hour 0 2 1 2   15.  sitting for 1 hour 0 4 4 4   16. Running on even ground 0 0 0 0  17. Running on uneven ground 0 0 0 0  18. Making sharp turns while running fast 0 0 0 0  19. Hopping  0 0 0 2  20. Rolling over in bed 4 4 4 4   Score total:  14/80 40/80 45.5/80 51.5/80     COGNITION: Overall cognitive status: Within functional limits for tasks assessed       LOWER EXTREMITY ROM:   ROM Right eval Left Eval AROM Right 9/2 Right 10/8 Left 10/8 Right 11/11 Right 12/11 Left 12/11 Right 12/7 Left 1/8  Right 1/8  Hip flexion             Hip extension             Hip abduction             Hip adduction             Hip internal rotation             Hip external rotation             Knee flexion 109 PROM 129 121 127  127  122 122-130    Knee extension 0 deg in resting 7 3 4  Lacking 9 deg 4 3 1 5 1 2   Ankle dorsiflexion             Ankle plantarflexion             Ankle inversion             Ankle eversion              (Blank rows = not tested)  LOWER EXTREMITY MMT:  MMT Right eval Left eval Right 9/2 Right 10/8 Right  Hip flexion     5/5  Hip extension       Hip abduction   5/5 5/5 5/5  Hip adduction       Hip internal rotation       Hip external rotation       Knee flexion   Pt able to perform prone knee flexion AROM without pain.  Pt tolerated minimal resistance well without pain. 4+/5 5/5  Knee extension     5/5  Ankle dorsiflexion       Ankle plantarflexion  Ankle inversion       Ankle eversion        (Blank rows = not tested)   GAIT: Comments: Pt ambulated without crutch.  Pt has good stability.  Pt had no limp and slightly favored R LE.  Pt has a heel to toe gait.  She has  decreased clearance on R foot and states that is her normal gait.   STAIRS:  Pt ascended stairs with a reciprocal gait and descended stairs with a step through to reciprocal gait Pt states she is fearful with descending stairs and has occasional pain with ascending stairs.                                                                                                                                TREATMENT:    07/06/24 Scifit bike L4 x34min Longsit HSS 30sec x3ea  Modified downward dog 3x30sec STS 3x10 from lowered plinth Step ups 8 box with cues for glute max activation 2x10ea Standing HSC RTB 2x10 Seated scooter rolls down hall x155ft for HS Prone HSC machine 15# 3x10 Leg Press cybex 100# 3x10   1/12 Scifit bike L4 x10min Longsit HSS 30sec x3ea  Updated knee extension ROM (1deg R, 0deg L) Modified downward dog 3x30sec  STS 3x10 from lowered plinth Step ups 8 box with cues for glute max activation 2x10ea Standing HSC RTB 2x10 Squats 2x10 Seated scooter rolls down hall x162ft  1/8 Scifit bike L4 x63min Longsit HSS 30sec x3ea  Updated knee extension ROM Modified downward dog 3x30sec  Prone HSC machine 10lbs 3x10 Cybex leg press 100# 3x10 STS 3x10 from lowered plinth Step ups at bottom stair with cues for glute max activation 2x10ea  12/30 Longsit HSS 30sec x3ea  Modified downward dog 3x30sec Scifit bike L3 x8min Supine SLR 3x15ea Staggered bridges 2x15 ea Prone HSC 5# 3x10 STS 3x10 from lowered plinth Hip hikes off 4 2x10ea Step ups at bottom stair with cues for glute max activation 2x10ea     12/17 Reviewed current function, HEP compliance, pain levels, and response to prior treatment. Assessed Knee ROM and LE strength.  See above. Staggered bridges 2x10 SLS on floor x 12 sec, 16 sec Cone taps on chair  2x10, on stool 10 reps with multiple attempts   12/15 Sci fit bike x44min L3 Long sit HSS 3x30sec ea Modified downward dog 10sec x4 Gait  x441ft Staggered bridges x20ea S/l hip abduction 2x10ea  Supine SLR 2x10ea STS 3x10 Prone HSC machine 10lbs 3x10 Cybex leg press 85# 3x10    12/11 Gait x255ft Staggered bridges x20ea S/l hip abduction 2x10ea  Supine SLR 2x10ea Prone HSC with eccentric focus 5# 2x10bil Updated knee ext bil (see objective)     PATIENT EDUCATION:  Education details: relevant anatomy, objective findings, goal progress, rationale of interventions, POC, dx, prognosis, gait, crutches, and HEP.  PT answered questions. Person educated: Patient Education method:  Explanation, Demonstration, verbal cues Education  comprehension: verbalized understanding, returned demonstration, verbal cues required  HOME EXERCISE PROGRAM:   ASSESSMENT:  CLINICAL IMPRESSION: Pt progressing well with exercises and NMC. Worked on glute engagement with 8 step ups. Pt able to increase resistance on prone HSC machine today. She denied pain with exercises. Benefits from LE stretching to improve mobility in posterior chain. Plan to update progress next session. Will prepare for d/c to independent program.   PN: Pt has been making good progress since having her last procedure.  Though she is making good progress, she continues to be limited with standing duration and ambulation distance.  Her pain in her HS is significantly better, though she is having knee pain.  Pt is not using crutches anymore and has increased her ambulation distance.  Pt has been having hip pain which limits her walking.  She has improved with gait.  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  Pt is limited with R knee extension ROM.  Pt demonstrates improved HS strength to 5/5 MMT.  She also had 5/5 in knee ext, hip flexion, and hip abd.  Pt has deficits with descending stairs though is able to ascend stairs well.  Pt demonstrates improved self perceived disability on the LEFS though  not clinically significant.  She is making good progress toward goals.  Pt has met STG's #1,2,5,6 and LTG's #2,4 and partially met STG's #3,4 and LTG #1.  Pt should benefit from continued skilled PT to address ongoing goals, improve eccentric strength and stability, reduce pain, and to assist in returning pt to desired level of function.       OBJECTIVE IMPAIRMENTS: Abnormal gait, decreased activity tolerance, decreased endurance, decreased mobility, difficulty walking, decreased ROM, decreased strength, hypomobility, increased fascial restrictions, impaired flexibility, and pain.   ACTIVITY LIMITATIONS: carrying, bending, standing, squatting, stairs, transfers, and locomotion level, dressing, bathing  PARTICIPATION LIMITATIONS: meal prep, cleaning, laundry, driving, shopping, and community activity  PERSONAL FACTORS: 1-2 comorbidities: bilat knee OA, Hx of SI pain are also affecting patient's functional outcome.   REHAB POTENTIAL: Good  CLINICAL DECISION MAKING: Stable/uncomplicated  EVALUATION COMPLEXITY: Low   GOALS  SHORT TERM GOALS:   Pt will be independent and compliant with HEP for improved pain, strength, ROM, and function.  Baseline: Goal status: MET 8/12 Target date:  01/27/2024  2.  Pt will wean out of KI and off crutches without adverse effects.   Baseline:  Goal status: GOAL MET  12/17 Target date:  01/27/2024  3.  Pt will report she is able to ambulate community distance without any feelings of instability.  Baseline:  Goal status:  PARTIALLY MET  9/2; no feelings of instability, but using crutch Target date: 02/03/2024  4.  Pt will ambulate with a normalized heel to toe gait pattern without limping.  Baseline:  Goal status:  PARTIALLY MET 12/17 Target date:  02/10/2024  5.  Pt will be able to perform her self care activities and shower transfers without increased pain and difficulty.  Baseline:  Goal status: GOAL MET   9/2 Target date:  02/17/2024  6.  Pt will  be able to tolerate exercises without adverse effects for improved strength and proprioception to assist with returning to PLOF.  Baseline:  Goal status: GOAL MET  9/2 Target date:  02/10/2024  LONG TERM GOALS: Target date: 07/12/2024  Pt will be able to perform stairs with a reciprocal gait with  good control without difficulty.  Baseline:  Goal status:  50% MET  2.  Pt will be able to perform her household chores without significant pain and difficulty.  Baseline:  Goal status: GOAL MET  9/2  3.  Pt will ambulate extended community distance without significant pain and difficulty.  Baseline:  Goal status:  PROGRESSING  4.  Pt will demo at least a 4 to 4+/5 MMT strength in R HS for improved performance of functional mobility and tolerance of daily mobility.  Baseline:  Goal status:  GOAL MET  10/8     PLAN:  PT FREQUENCY: 1-2x/wk   PT DURATION:  5 weeks (pt may miss an entire week)  PLANNED INTERVENTIONS: 02835- PT Re-evaluation, 97750- Physical Performance Testing, 97110-Therapeutic exercises, 97530- Therapeutic activity, [id number]- Neuromuscular re-education, 97535- Self Care, 02859- Manual therapy, [phone]- Gait training, [phone]- Aquatic Therapy, [phone]- Electrical stimulation (unattended), [phone]- Electrical stimulation (manual), [id number]- Ultrasound, Patient/Family education, Balance training, Stair training, Taping, Joint mobilization, Spinal mobilization, DME instructions, Cryotherapy, and Moist heat  PLAN FOR NEXT SESSION:  Cont with ther-ex, gait, and neuro re-ed.  Asberry Rodes, PTA  07/06/24 5:02 PM           "

## 2024-07-10 ENCOUNTER — Ambulatory Visit (HOSPITAL_BASED_OUTPATIENT_CLINIC_OR_DEPARTMENT_OTHER): Admitting: Physical Therapy

## 2024-07-13 ENCOUNTER — Ambulatory Visit (HOSPITAL_BASED_OUTPATIENT_CLINIC_OR_DEPARTMENT_OTHER)

## 2024-07-13 ENCOUNTER — Encounter (HOSPITAL_BASED_OUTPATIENT_CLINIC_OR_DEPARTMENT_OTHER): Payer: Self-pay

## 2024-07-13 DIAGNOSIS — M79651 Pain in right thigh: Secondary | ICD-10-CM

## 2024-07-13 DIAGNOSIS — M6281 Muscle weakness (generalized): Secondary | ICD-10-CM | POA: Diagnosis not present

## 2024-07-13 DIAGNOSIS — R262 Difficulty in walking, not elsewhere classified: Secondary | ICD-10-CM

## 2024-07-13 DIAGNOSIS — M25661 Stiffness of right knee, not elsewhere classified: Secondary | ICD-10-CM

## 2024-07-13 NOTE — Therapy (Addendum)
 " OUTPATIENT PHYSICAL THERAPY LOWER EXTREMITY TREATMENT/DISCHARGE      Patient Name: Amanda Mack MRN: [id number] DOB:1966-04-25, 59 y.o., female Today's Date: 07/14/2024  END OF SESSION:  PT End of Session - 07/13/24 1610     Visit Number 28    Number of Visits 29    Date for Recertification  07/12/24    Authorization Type [employer]    PT Start Time 1605    PT Stop Time 1650    PT Time Calculation (min) 45 min    Activity Tolerance Patient tolerated treatment well    Behavior During Therapy WFL for tasks assessed/performed                                   Past Medical History:  Diagnosis Date   Adopted    Allergy    Arthritis    GERD (gastroesophageal reflux disease)    H/O LEEP 1990   Migraines    Routine general medical examination at a health care facility 03/09/2023   Past Surgical History:  Procedure Laterality Date   CERVICAL CONE BIOPSY     AND d&c   KNEE ARTHROSCOPY Left 2019   LESION DESTRUCTION N/A 03/30/2024   Procedure: DESTRUCTION, LESION, GENITALIA;  Surgeon: Viktoria Comer SAUNDERS, MD;  Location: [facility];  Service: Gynecology;  Laterality: N/A;   SHOULDER SURGERY     VAGINECTOMY, PARTIAL N/A 03/30/2024   Procedure: VAGINECTOMY, PARTIAL;  Surgeon: Viktoria Comer SAUNDERS, MD;  Location: [facility];  Service: Gynecology;  Laterality: N/A;  endocervical curettage   Patient Active Problem List   Diagnosis Date Noted   Vaginal dysplasia 03/30/2024   Routine general medical examination at a health care facility 03/09/2023   Menopausal symptoms 08/13/2022   Hyperlipidemia 09/23/2020   Vitamin D  deficiency, unspecified 03/01/2019   Sinus tachycardia 09/29/2016   Essential hypertension 04/17/2016   Class 3 severe obesity with serious comorbidity and body mass index (BMI) of 40.0 to 44.9 in adult [facility]) 04/17/2016     REFERRING PROVIDER: Bissell, Jaclyn, PA-C  REFERRING DIAG: Pain in R lower limb, M79.604  THERAPY DIAG:  Difficulty in  walking, not elsewhere classified  Muscle weakness (generalized)  Pain in right thigh  Stiffness of right knee, not elsewhere classified  Rationale for Evaluation and Treatment: Rehabilitation  ONSET DATE: 12/17/2023 and 01/02/2024  SUBJECTIVE:   SUBJECTIVE STATEMENT: Pt reports her knees get tight in the morning but improves during the day once she moves around.   PERTINENT HISTORY: Partial simple vaginectomy, endocervical curettage on 03/30/24.  Bilat knee OA, L knee arthroscopy 2013 Hx of SI pain R Shoulder surgery HTN  PAIN:  NPRS:  0/10  now, 1/10 worst which is rare Location:  R HS Type:  feels tight  NPRS:  1/10 current, 1-2/10 worst Location:  R medial knee   PRECAUTIONS: Other: knee OA    WEIGHT BEARING RESTRICTIONS: No  FALLS:  Has patient fallen in last 6 months? 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   PLOF: Independent  PATIENT GOALS: to remove crutches and walk normally   OBJECTIVE:  Note: Objective measures were completed at Evaluation unless otherwise noted.  DIAGNOSTIC FINDINGS: X rays per PA note:  tricompartmental degenerative changes present without acute displaced fracture or dislocation identified.  When compared to radiographs from Nov 2024, no significant changes identified.  PATIENT SURVEYS:  LEFS  Extreme difficulty/unable (0), Quite a bit of difficulty (1), Moderate difficulty (2), Little difficulty (3), No difficulty (4) Survey date:  01/06/24 02/22/24 03/29/24 06/07/24 07/13/24  Any of your usual work, housework or school activities 0 3 3 3 4   2. Usual hobbies, recreational or sporting activities 0 0 0 3 4  3. Getting into/out of the bath 3 4 4 4 4   4. Walking between rooms 3 3.5 4 4 4   5. Putting on socks/shoes 2 4 4 4 4   6. Squatting  0 0 0 3 4  7. Lifting an object, like a  bag of groceries from the floor 0 3 4 4 4   8. Performing light activities around your home 0 3.5 4 4 4   9. Performing heavy activities around your home 0 1.5 2 3 4   10. Getting into/out of a car 2 4 4 4 4   11. Walking 2 blocks 0 1 3.5 3.5 4  12. Walking 1 mile 0 0 0 1 3  13. Going up/down 10 stairs (1 flight) 0 3.5 4 3 4   14. Standing for 1 hour 0 2 1 2 3   15.  sitting for 1 hour 0 4 4 4 4   16. Running on even ground 0 0 0 0 0  17. Running on uneven ground 0 0 0 0 0  18. Making sharp turns while running fast 0 0 0 0 0  19. Hopping  0 0 0 2 4  20. Rolling over in bed 4 4 4 4 4   Score total:  14/80 40/80 45.5/80 51.5/80 70/80     COGNITION: Overall cognitive status: Within functional limits for tasks assessed       LOWER EXTREMITY ROM:   ROM Right eval Left Eval AROM Right 9/2 Right 10/8 Left 10/8 Right 11/11 Right 12/11 Left 12/11 Right 12/7 Left 1/8  Right 1/8   Hip flexion              Hip extension              Hip abduction              Hip adduction              Hip internal rotation              Hip external rotation              Knee flexion 109 PROM 129 121 127  127  122 122-130     Knee extension 0 deg in resting 7 3 4  Lacking 9 deg 4 3 1 5 1 2    Ankle dorsiflexion              Ankle plantarflexion              Ankle inversion              Ankle eversion               (Blank rows = not tested)  LOWER EXTREMITY MMT:  MMT Right eval Left eval Right 9/2 Right 10/8 Right  Hip flexion     5/5  Hip extension       Hip abduction   5/5 5/5 5/5  Hip adduction       Hip internal rotation       Hip external rotation  Knee flexion   Pt able to perform prone knee flexion AROM without pain.  Pt tolerated minimal resistance well without pain. 4+/5 5/5  Knee extension     5/5  Ankle dorsiflexion       Ankle plantarflexion       Ankle inversion       Ankle eversion        (Blank rows = not tested)   GAIT: Comments:No antalgic deviation.    STAIRS:  reciprocal, no UE support required. No obvious deviation                                                                                                                                TREATMENT:    07/13/24 Scifit bike L4 x79min Longsit HSS 30sec x3ea  Modified downward dog 3x30sec Updated goals  HEP update LEFS  STS 3x10 from lowered plinth Step ups 8 box with cues for glute max activation 2x10ea Stool scoot down hall  Stair climbing    07/06/24 Scifit bike L4 x46min Longsit HSS 30sec x3ea  Modified downward dog 3x30sec STS 3x10 from lowered plinth Step ups 8 box with cues for glute max activation 2x10ea Standing HSC RTB 2x10 Seated scooter rolls down hall x179ft for HS Prone HSC machine 15# 3x10 Leg Press cybex 100# 3x10   1/12 Scifit bike L4 x8min Longsit HSS 30sec x3ea  Updated knee extension ROM (1deg R, 0deg L) Modified downward dog 3x30sec  STS 3x10 from lowered plinth Step ups 8 box with cues for glute max activation 2x10ea Standing HSC RTB 2x10 Squats 2x10 Seated scooter rolls down hall x122ft  1/8 Scifit bike L4 x56min Longsit HSS 30sec x3ea  Updated knee extension ROM Modified downward dog 3x30sec  Prone HSC machine 10lbs 3x10 Cybex leg press 100# 3x10 STS 3x10 from lowered plinth Step ups at bottom stair with cues for glute max activation 2x10ea   PATIENT EDUCATION:  Education details: relevant anatomy, objective findings, goal progress, rationale of interventions, POC, dx, prognosis, gait, crutches, and HEP.  PT answered questions. Person educated: Patient Education method:  Explanation, Demonstration, verbal cues Education comprehension: verbalized understanding, returned demonstration, verbal cues required  HOME EXERCISE PROGRAM:  Access Code: [email or web address] URL: [email or web address] Date: 07/13/2024 Prepared by: Asberry Rodes  Exercises - Modified Thomas Stretch  - 3 x daily - 7 x weekly - 3 sets - 30seconds  hold - Seated Hamstring Stretch  - 3 x daily - 7 x weekly - 3 sets - 30seconds hold - Staggered Sit-to-Stand  - 1 x daily - 7 x weekly - 2-3 sets - 10 reps - Runner's Step Up/Down  - 1 x daily - 7 x weekly - 2-3 sets - 10 reps - Lateral Step Up  - 1 x daily - 7 x weekly - 2-3 sets - 10 reps ASSESSMENT:  CLINICAL IMPRESSION: Pt has attended 28 visits of PT thus far and has made steady progress towards goals. She  has improved LEFS core to 70/80. She has met 6/6 STG and all LTG. She is able to ambulate with decreased deviations and can complete stair climbing with reciprocal pattern and no compensatory mechanisms. Pt is appropriate for d/c to HEP at this time. Provided pt with updated HEP and gym program.     OBJECTIVE IMPAIRMENTS: Abnormal gait, decreased activity tolerance, decreased endurance, decreased mobility, difficulty walking, decreased ROM, decreased strength, hypomobility, increased fascial restrictions, impaired flexibility, and pain.   ACTIVITY LIMITATIONS: carrying, bending, standing, squatting, stairs, transfers, and locomotion level, dressing, bathing  PARTICIPATION LIMITATIONS: meal prep, cleaning, laundry, driving, shopping, and community activity  PERSONAL FACTORS: 1-2 comorbidities: bilat knee OA, Hx of SI pain are also affecting patient's functional outcome.   REHAB POTENTIAL: Good  CLINICAL DECISION MAKING: Stable/uncomplicated  EVALUATION COMPLEXITY: Low   GOALS  SHORT TERM GOALS:   Pt will be independent and compliant with HEP for improved pain, strength, ROM, and function.  Baseline: Goal status: MET 8/12 Target date:  01/27/2024  2.  Pt will wean out of KI and off crutches without adverse effects.   Baseline:  Goal status: GOAL MET  12/17 Target date:  01/27/2024  3.  Pt will report she is able to ambulate community distance without any feelings of instability.  Baseline:  Goal status:  MET 1/22 Target date: 02/03/2024  4.  Pt will ambulate with a  normalized heel to toe gait pattern without limping.  Baseline:  Goal status:  MET 1/22 Target date:  02/10/2024  5.  Pt will be able to perform her self care activities and shower transfers without increased pain and difficulty.  Baseline:  Goal status: GOAL MET   9/2 Target date:  02/17/2024  6.  Pt will be able to tolerate exercises without adverse effects for improved strength and proprioception to assist with returning to PLOF.  Baseline:  Goal status: GOAL MET  9/2 Target date:  02/10/2024  LONG TERM GOALS: Target date: 07/12/2024  Pt will be able to perform stairs with a reciprocal gait with good control without difficulty.  Baseline:  Goal status:    2.  Pt will be able to perform her household chores without significant pain and difficulty.  Baseline:  Goal status: GOAL MET  9/2  3.  Pt will ambulate extended community distance without significant pain and difficulty.  Baseline:  Goal status:  MET 1/22  4.  Pt will demo at least a 4 to 4+/5 MMT strength in R HS for improved performance of functional mobility and tolerance of daily mobility.  Baseline:  Goal status:  GOAL MET  10/8     PLAN:  PT FREQUENCY: 1 visit  PT DURATION:  1 visit  PLANNED INTERVENTIONS: [id number]- PT Re-evaluation, 97750- Physical Performance Testing, 97110-Therapeutic exercises, 97530- Therapeutic activity, 97112- Neuromuscular re-education, 97535- Self Care, [id number]- Manual therapy, [phone]- Gait training, [phone]- Aquatic Therapy, [phone]- Electrical stimulation (unattended), [phone]- Electrical stimulation (manual), [id number]- Ultrasound, Patient/Family education, Balance training, Stair training, Taping, Joint mobilization, Spinal mobilization, DME instructions, Cryotherapy, and Moist heat  PLAN FOR NEXT SESSION:  Pt to be discharged from skilled PT due to meeting goals. Pt is agreeable with discharge.  PT spoke to PTA and pt.  PT reviewed note.   PHYSICAL THERAPY DISCHARGE SUMMARY  Visits from Start of  Care: 28  Current functional level related to goals / functional outcomes: See above   Remaining deficits: See above   Education / Equipment: See above     Asberry  Edwardo Wojnarowski, PTA  07/14/24 8:38 AM  Leigh Minerva III PT, DPT 07/15/24 9:16 PM            "

## 2024-07-15 NOTE — Addendum Note (Signed)
 Addended by: MARGRETTE MOSE RAMAN on: 07/15/2024 09:17 PM   Modules accepted: Orders

## 2024-08-04 ENCOUNTER — Encounter (HOSPITAL_BASED_OUTPATIENT_CLINIC_OR_DEPARTMENT_OTHER): Payer: Self-pay | Admitting: Physical Therapy

## 2024-09-27 ENCOUNTER — Ambulatory Visit (HOSPITAL_BASED_OUTPATIENT_CLINIC_OR_DEPARTMENT_OTHER): Admitting: Obstetrics & Gynecology
